# Patient Record
Sex: Male | Born: 1935 | Race: Black or African American | Hispanic: No | Smoking: Never smoker
Health system: Southern US, Community
[De-identification: ages and names within clinical notes are randomized; demographics above are authoritative.]

## PROBLEM LIST (undated history)

## (undated) DIAGNOSIS — I10 Essential (primary) hypertension: Secondary | ICD-10-CM

## (undated) DIAGNOSIS — F0390 Unspecified dementia without behavioral disturbance: Secondary | ICD-10-CM

## (undated) DIAGNOSIS — E039 Hypothyroidism, unspecified: Secondary | ICD-10-CM

## (undated) DIAGNOSIS — I639 Cerebral infarction, unspecified: Secondary | ICD-10-CM

## (undated) DIAGNOSIS — E7800 Pure hypercholesterolemia, unspecified: Secondary | ICD-10-CM

---

## 2001-12-18 ENCOUNTER — Encounter: Payer: Self-pay | Admitting: Ophthalmology

## 2001-12-20 ENCOUNTER — Ambulatory Visit (HOSPITAL_COMMUNITY): Admission: RE | Admit: 2001-12-20 | Discharge: 2001-12-20 | Payer: Self-pay | Admitting: Ophthalmology

## 2001-12-21 ENCOUNTER — Ambulatory Visit (HOSPITAL_COMMUNITY): Admission: RE | Admit: 2001-12-21 | Discharge: 2001-12-21 | Payer: Self-pay | Admitting: Ophthalmology

## 2003-11-28 ENCOUNTER — Ambulatory Visit (HOSPITAL_COMMUNITY): Admission: RE | Admit: 2003-11-28 | Discharge: 2003-11-28 | Payer: Self-pay | Admitting: Ophthalmology

## 2004-01-10 ENCOUNTER — Emergency Department (HOSPITAL_COMMUNITY): Admission: EM | Admit: 2004-01-10 | Discharge: 2004-01-10 | Payer: Self-pay | Admitting: *Deleted

## 2004-01-16 ENCOUNTER — Emergency Department (HOSPITAL_COMMUNITY): Admission: EM | Admit: 2004-01-16 | Discharge: 2004-01-16 | Payer: Self-pay | Admitting: Family Medicine

## 2004-04-15 ENCOUNTER — Emergency Department (HOSPITAL_COMMUNITY): Admission: EM | Admit: 2004-04-15 | Discharge: 2004-04-15 | Payer: Self-pay | Admitting: Emergency Medicine

## 2004-05-04 ENCOUNTER — Encounter: Admission: RE | Admit: 2004-05-04 | Discharge: 2004-05-04 | Payer: Self-pay | Admitting: Sports Medicine

## 2004-07-06 ENCOUNTER — Emergency Department (HOSPITAL_COMMUNITY): Admission: EM | Admit: 2004-07-06 | Discharge: 2004-07-07 | Payer: Self-pay | Admitting: Family Medicine

## 2006-03-17 ENCOUNTER — Encounter: Admission: RE | Admit: 2006-03-17 | Discharge: 2006-03-17 | Payer: Self-pay | Admitting: Cardiology

## 2006-03-22 ENCOUNTER — Encounter (HOSPITAL_COMMUNITY): Admission: RE | Admit: 2006-03-22 | Discharge: 2006-05-04 | Payer: Self-pay | Admitting: Cardiology

## 2006-04-06 ENCOUNTER — Encounter: Admission: RE | Admit: 2006-04-06 | Discharge: 2006-04-06 | Payer: Self-pay | Admitting: Cardiology

## 2007-01-11 ENCOUNTER — Emergency Department (HOSPITAL_COMMUNITY): Admission: EM | Admit: 2007-01-11 | Discharge: 2007-01-11 | Payer: Self-pay | Admitting: Emergency Medicine

## 2007-06-19 ENCOUNTER — Encounter: Admission: RE | Admit: 2007-06-19 | Discharge: 2007-06-19 | Payer: Self-pay | Admitting: Cardiology

## 2009-01-30 ENCOUNTER — Emergency Department (HOSPITAL_COMMUNITY): Admission: EM | Admit: 2009-01-30 | Discharge: 2009-01-30 | Payer: Self-pay | Admitting: Emergency Medicine

## 2009-02-17 ENCOUNTER — Emergency Department (HOSPITAL_COMMUNITY): Admission: EM | Admit: 2009-02-17 | Discharge: 2009-02-17 | Payer: Self-pay | Admitting: Family Medicine

## 2009-07-02 ENCOUNTER — Emergency Department (HOSPITAL_COMMUNITY): Admission: EM | Admit: 2009-07-02 | Discharge: 2009-07-02 | Payer: Self-pay | Admitting: Family Medicine

## 2011-01-07 NOTE — Op Note (Signed)
. [facility]  Patient:    Patrick Rhodes, Patrick Rhodes Visit Number: [id number] MRN: [id number]          Service Type: DSU Location: [facility] 2874 [id number] Attending Physician:  Karenann Cai Dictated by:   Marya Landry Carlyle Lipa., M.D. Proc. Date: 12/21/01 Admit Date:  12/21/2001 Discharge Date: 12/21/2001                             Operative Report  PREOPERATIVE DIAGNOSIS: 1. Dislocated intraocular lens. 2. Status post operative cataract extraction, left eye.  POSTOPERATIVE DIAGNOSIS: 1. Dislocated intraocular lens. 2. Status post operative cataract extraction, left eye.  OPERATION PERFORMED:  Exchange of intraocular lens, left eye with anterior vitrectomy.  SURGEON:  Marya Landry. Carlyle Lipa., M.D.  ANESTHESIA:  General.  INDICATIONS FOR PROCEDURE:  The patient is a 77 year old gentleman who underwent a cataract extraction with intraocular lens implantation yesterday. During the procedure it was noticed that the lens had been partially dislocated.  The lens was removed by the extracapsular technique.  There was a tear or dehiscence of the posterior capsule medially.  An anterior vitrectomy was done and a posterior chamber lens was seated in the sulcus expecting there to be enough posterior capsular support to support the lens.  He came into the office this morning for his first postoperative follow-up.  When the patch was removed, the intraocular lens was found to be displaced inferiorly and medially.  The decision was made to bring him to the operating room so that the lens could be retrieved and exchanged.  He is admitted at this time for that purpose.  DESCRIPTION OF PROCEDURE:  Under the influence of general inhalation anesthesia, the patient was prepped and draped in the usual manner.  The lid speculum was inserted under the upper and lower lids of the left eye.  A 4-0 silk traction suture was passed through ____________ traction.   The eye was inspected.  The lens was found to be located in the same position that had been seen earlier.  A conjunctival flap was raised and hemostasis achieved by using cautery.  Two of the sutures that had been placed ____________ were removed and a lens forceps was inserted into the eye and the superior haptic grasped.  The lens was removed from the eye but vitreous followed the lens from the eye.  An anterior vitrectomy was done and the pupil was constricted using Miochol.  Ascertaining that there was no additional vitreous in the anterior chamber, an anterior chamber lens was seated across the pupil into the chamber angle without difficulty.  The corneoscleral wound was closed by using a combination of interrupted sutures of 8-0 Vicryl and 10-0 nylon. After it was ascertained that the wound was airtight and watertight, the conjunctiva was closed using thermal cautery.  1 cc of Celestone and 0.5 cc of Gentamicin were injected subconjunctivally.  Maxitrol ophthalmic ointment and Pilopine drops were applied along with a patch and  Fox shield.  The patient tolerated the procedure well and was discharged to post anesthesia care unit in satisfactory condition.  His son was instructed how to care for him today. Particularly, he is to restrain from strenuous activity such as stooping, bending and lifting.  He is to see me in the office tomorrow for further evaluation.  DISCHARGE DIAGNOSES: 1. Displaced intraocular lens, left eye. 2. Status post cataract extraction with intraocular lens implantation, left    eye.  Dictated by:   Marya Landry Carlyle Lipa., M.D. Attending Physician:  Karenann Cai DD:  12/21/01 TD:  12/24/01 Job: [id number]

## 2011-01-07 NOTE — Op Note (Signed)
[place]  Patient:    Patrick Rhodes, Patrick Rhodes Visit Number: [id number] MRN: [id number]          Service Type: DSU Location: [facility] 2874 01 Attending Physician:  Karenann Cai Dictated by:   Marya Landry Carlyle Lipa., M.D. Admit Date:  12/21/2001 Discharge Date: 12/21/2001                             Operative Report  PREOPERATIVE DIAGNOSIS:  Immature cataract, left eye.  POSTOPERATIVE DIAGNOSIS:  Immature cataract, left eye.  OPERATION:  Extracapsular cataract extraction with intraocular lens implantation.  SURGEON:  Marya Landry. Carlyle Lipa., M.D.  ANESTHESIA:  Local using Xylocaine 2%, Marcaine 0.75% and Wydase.  JUSTIFICATION FOR PROCEDURE:  This is a 77 year old gentleman who complains of blurring of vision with difficulty seeing to read and get around.  He was evaluated and found to have a visual acuity best corrected to 20/100 on the right, 20/400 on the left.  There are bilateral immature cataracts, worse on the left than the right.  Cataract extraction of the left eye was recommended; he is admitted at this time for this purpose.  He gives a history of trauma to the left eye.  DESCRIPTION OF PROCEDURE:  The patient was taken to the operating room where a Van Lint akinesia and retrobulbar anesthesia were given.  He was prepped and draped in the usual manner.  The lid speculum was inserted under the upper and lower lids of the of the left eye and a 4-0 silk traction suture was passed through the belly of the superior rectus muscle for traction.  A fornix-based conjunctival flap was turned and hemostasis was achieved by using cautery.  An incision was made in the sclera approximately 1 mm posterior to the limbus. This was incision was dissected down in clear cornea using a crescent blade. A sideport incision was made at the 1:30 oclock position and Ocucoat was injected into the eye through sideport incision.  An anterior  capsulotomy was done using a bent 25-gauge needle.  As the capsulotomy was done, it was noticed that there was a clear space between the lens and ciliary body bilaterally, indicating that the lens was dislocated.  Therefore, the decision was made to proceed in extracapsular fashion.  The corneoscleral wound was then extended, first toward the left and then toward the right, placing a single 8-0 Vicryl suture across each arm of the incision as it was made respectively toward the left and toward the right.  The nucleus was then manually expressed from the eye.  The residual cortical material was aspirated using the I/A handpiece.  A small amount of vitreous was ______ in the wound. An anterior vitrectomy was done ______.  It was felt that most of the posterior capsule was intact, therefore an oval posterior chamber lens was then seated behind the eye into the sulcus without difficulty.  The anterior chamber was reformed and the pupil was constricted using Miochol.  The corneoscleral wound was closed by using a combination of interrupted sutures of 8-0 Vicryl and 10-0 nylon.  After it was ascertained that the wound was airtight and watertight, the conjunctiva was closed using thermal cautery. One cc of Celestone and 0.5 cc of gentamicin were injected subconjunctivally. Maxitrol ophthalmic ointment and Pilopine ointment were applied, along with a patch and Fox shield.  The patient tolerated the procedure well and was discharged to the postanesthesia recovery  room in satisfactory condition.  He is instructed to rest today, to take Vicodin every four hours as needed for pain and he is to see me in the office tomorrow for further evaluation.  DISCHARGE DIAGNOSIS:  Immature cataract of left eye. Dictated by:   Marya Landry Carlyle Lipa., M.D. Attending Physician:  Karenann Cai DD:  12/20/01 TD:  12/21/01 Job: [id number]

## 2011-10-06 DIAGNOSIS — E039 Hypothyroidism, unspecified: Secondary | ICD-10-CM | POA: Diagnosis not present

## 2011-10-06 DIAGNOSIS — I2510 Atherosclerotic heart disease of native coronary artery without angina pectoris: Secondary | ICD-10-CM | POA: Diagnosis not present

## 2011-10-06 DIAGNOSIS — D649 Anemia, unspecified: Secondary | ICD-10-CM | POA: Diagnosis not present

## 2011-10-06 DIAGNOSIS — I10 Essential (primary) hypertension: Secondary | ICD-10-CM | POA: Diagnosis not present

## 2012-02-02 DIAGNOSIS — I10 Essential (primary) hypertension: Secondary | ICD-10-CM | POA: Diagnosis not present

## 2012-02-02 DIAGNOSIS — E039 Hypothyroidism, unspecified: Secondary | ICD-10-CM | POA: Diagnosis not present

## 2012-02-02 DIAGNOSIS — I2510 Atherosclerotic heart disease of native coronary artery without angina pectoris: Secondary | ICD-10-CM | POA: Diagnosis not present

## 2012-06-25 DIAGNOSIS — E039 Hypothyroidism, unspecified: Secondary | ICD-10-CM | POA: Diagnosis not present

## 2012-06-25 DIAGNOSIS — R609 Edema, unspecified: Secondary | ICD-10-CM | POA: Diagnosis not present

## 2012-06-25 DIAGNOSIS — I8310 Varicose veins of unspecified lower extremity with inflammation: Secondary | ICD-10-CM | POA: Diagnosis not present

## 2012-06-25 DIAGNOSIS — M2550 Pain in unspecified joint: Secondary | ICD-10-CM | POA: Diagnosis not present

## 2012-06-25 DIAGNOSIS — I10 Essential (primary) hypertension: Secondary | ICD-10-CM | POA: Diagnosis not present

## 2012-07-23 DIAGNOSIS — I8310 Varicose veins of unspecified lower extremity with inflammation: Secondary | ICD-10-CM | POA: Diagnosis not present

## 2012-07-23 DIAGNOSIS — L02419 Cutaneous abscess of limb, unspecified: Secondary | ICD-10-CM | POA: Diagnosis not present

## 2012-07-23 DIAGNOSIS — I10 Essential (primary) hypertension: Secondary | ICD-10-CM | POA: Diagnosis not present

## 2012-07-23 DIAGNOSIS — L03119 Cellulitis of unspecified part of limb: Secondary | ICD-10-CM | POA: Diagnosis not present

## 2012-07-23 DIAGNOSIS — E039 Hypothyroidism, unspecified: Secondary | ICD-10-CM | POA: Diagnosis not present

## 2012-08-07 ENCOUNTER — Other Ambulatory Visit (HOSPITAL_COMMUNITY): Payer: Self-pay | Admitting: Internal Medicine

## 2012-08-07 ENCOUNTER — Ambulatory Visit (HOSPITAL_COMMUNITY)
Admission: RE | Admit: 2012-08-07 | Discharge: 2012-08-07 | Disposition: A | Discharge status: Home / Self Care | Payer: Medicare Other | Source: Ambulatory Visit | Attending: Internal Medicine | Admitting: Internal Medicine

## 2012-08-07 DIAGNOSIS — I10 Essential (primary) hypertension: Secondary | ICD-10-CM | POA: Diagnosis not present

## 2012-08-07 DIAGNOSIS — I2510 Atherosclerotic heart disease of native coronary artery without angina pectoris: Secondary | ICD-10-CM | POA: Diagnosis not present

## 2012-08-07 DIAGNOSIS — M7989 Other specified soft tissue disorders: Secondary | ICD-10-CM

## 2012-08-07 DIAGNOSIS — M79606 Pain in leg, unspecified: Secondary | ICD-10-CM

## 2012-08-07 DIAGNOSIS — M79609 Pain in unspecified limb: Secondary | ICD-10-CM | POA: Diagnosis not present

## 2012-08-07 DIAGNOSIS — E039 Hypothyroidism, unspecified: Secondary | ICD-10-CM | POA: Diagnosis not present

## 2012-08-07 NOTE — Progress Notes (Signed)
Left:  No evidence of DVT, superficial thrombosis, or Baker's cyst.  Right:  Negative for DVT in the common femoral vein.  

## 2013-05-30 DIAGNOSIS — H43819 Vitreous degeneration, unspecified eye: Secondary | ICD-10-CM | POA: Diagnosis not present

## 2013-05-30 DIAGNOSIS — H5710 Ocular pain, unspecified eye: Secondary | ICD-10-CM | POA: Diagnosis not present

## 2013-10-31 DIAGNOSIS — E039 Hypothyroidism, unspecified: Secondary | ICD-10-CM | POA: Diagnosis not present

## 2013-10-31 DIAGNOSIS — I10 Essential (primary) hypertension: Secondary | ICD-10-CM | POA: Diagnosis not present

## 2013-10-31 DIAGNOSIS — Z125 Encounter for screening for malignant neoplasm of prostate: Secondary | ICD-10-CM | POA: Diagnosis not present

## 2013-10-31 DIAGNOSIS — I2510 Atherosclerotic heart disease of native coronary artery without angina pectoris: Secondary | ICD-10-CM | POA: Diagnosis not present

## 2014-01-09 DIAGNOSIS — H538 Other visual disturbances: Secondary | ICD-10-CM | POA: Diagnosis not present

## 2014-01-09 DIAGNOSIS — H43819 Vitreous degeneration, unspecified eye: Secondary | ICD-10-CM | POA: Diagnosis not present

## 2014-06-05 DIAGNOSIS — I10 Essential (primary) hypertension: Secondary | ICD-10-CM | POA: Diagnosis not present

## 2014-06-05 DIAGNOSIS — I2510 Atherosclerotic heart disease of native coronary artery without angina pectoris: Secondary | ICD-10-CM | POA: Diagnosis not present

## 2014-06-05 DIAGNOSIS — Z125 Encounter for screening for malignant neoplasm of prostate: Secondary | ICD-10-CM | POA: Diagnosis not present

## 2014-06-05 DIAGNOSIS — E039 Hypothyroidism, unspecified: Secondary | ICD-10-CM | POA: Diagnosis not present

## 2014-06-26 DIAGNOSIS — I6990 Unspecified sequelae of unspecified cerebrovascular disease: Secondary | ICD-10-CM | POA: Diagnosis not present

## 2014-06-26 DIAGNOSIS — R269 Unspecified abnormalities of gait and mobility: Secondary | ICD-10-CM | POA: Diagnosis not present

## 2014-07-01 DIAGNOSIS — R269 Unspecified abnormalities of gait and mobility: Secondary | ICD-10-CM | POA: Diagnosis not present

## 2014-07-01 DIAGNOSIS — I6990 Unspecified sequelae of unspecified cerebrovascular disease: Secondary | ICD-10-CM | POA: Diagnosis not present

## 2014-07-04 DIAGNOSIS — I6990 Unspecified sequelae of unspecified cerebrovascular disease: Secondary | ICD-10-CM | POA: Diagnosis not present

## 2014-07-04 DIAGNOSIS — R269 Unspecified abnormalities of gait and mobility: Secondary | ICD-10-CM | POA: Diagnosis not present

## 2014-07-09 DIAGNOSIS — I6990 Unspecified sequelae of unspecified cerebrovascular disease: Secondary | ICD-10-CM | POA: Diagnosis not present

## 2014-07-09 DIAGNOSIS — R269 Unspecified abnormalities of gait and mobility: Secondary | ICD-10-CM | POA: Diagnosis not present

## 2014-07-14 DIAGNOSIS — R269 Unspecified abnormalities of gait and mobility: Secondary | ICD-10-CM | POA: Diagnosis not present

## 2014-07-14 DIAGNOSIS — I6990 Unspecified sequelae of unspecified cerebrovascular disease: Secondary | ICD-10-CM | POA: Diagnosis not present

## 2014-07-16 DIAGNOSIS — I6990 Unspecified sequelae of unspecified cerebrovascular disease: Secondary | ICD-10-CM | POA: Diagnosis not present

## 2014-07-16 DIAGNOSIS — R269 Unspecified abnormalities of gait and mobility: Secondary | ICD-10-CM | POA: Diagnosis not present

## 2014-07-30 DIAGNOSIS — I6990 Unspecified sequelae of unspecified cerebrovascular disease: Secondary | ICD-10-CM | POA: Diagnosis not present

## 2014-07-30 DIAGNOSIS — R269 Unspecified abnormalities of gait and mobility: Secondary | ICD-10-CM | POA: Diagnosis not present

## 2014-08-08 DIAGNOSIS — R269 Unspecified abnormalities of gait and mobility: Secondary | ICD-10-CM | POA: Diagnosis not present

## 2014-08-08 DIAGNOSIS — I6990 Unspecified sequelae of unspecified cerebrovascular disease: Secondary | ICD-10-CM | POA: Diagnosis not present

## 2014-09-10 DIAGNOSIS — E039 Hypothyroidism, unspecified: Secondary | ICD-10-CM | POA: Diagnosis not present

## 2014-09-10 DIAGNOSIS — I10 Essential (primary) hypertension: Secondary | ICD-10-CM | POA: Diagnosis not present

## 2014-09-10 DIAGNOSIS — I2510 Atherosclerotic heart disease of native coronary artery without angina pectoris: Secondary | ICD-10-CM | POA: Diagnosis not present

## 2014-11-10 DIAGNOSIS — I2510 Atherosclerotic heart disease of native coronary artery without angina pectoris: Secondary | ICD-10-CM | POA: Diagnosis not present

## 2014-11-10 DIAGNOSIS — E039 Hypothyroidism, unspecified: Secondary | ICD-10-CM | POA: Diagnosis not present

## 2014-11-10 DIAGNOSIS — I10 Essential (primary) hypertension: Secondary | ICD-10-CM | POA: Diagnosis not present

## 2014-11-11 DIAGNOSIS — H01009 Unspecified blepharitis unspecified eye, unspecified eyelid: Secondary | ICD-10-CM | POA: Diagnosis not present

## 2014-11-11 DIAGNOSIS — H43813 Vitreous degeneration, bilateral: Secondary | ICD-10-CM | POA: Diagnosis not present

## 2015-03-09 DIAGNOSIS — E780 Pure hypercholesterolemia: Secondary | ICD-10-CM | POA: Diagnosis not present

## 2015-03-09 DIAGNOSIS — E039 Hypothyroidism, unspecified: Secondary | ICD-10-CM | POA: Diagnosis not present

## 2015-03-09 DIAGNOSIS — I10 Essential (primary) hypertension: Secondary | ICD-10-CM | POA: Diagnosis not present

## 2015-03-09 DIAGNOSIS — I2510 Atherosclerotic heart disease of native coronary artery without angina pectoris: Secondary | ICD-10-CM | POA: Diagnosis not present

## 2015-05-27 DIAGNOSIS — I10 Essential (primary) hypertension: Secondary | ICD-10-CM | POA: Diagnosis not present

## 2015-05-27 DIAGNOSIS — E781 Pure hyperglyceridemia: Secondary | ICD-10-CM | POA: Diagnosis not present

## 2015-05-27 DIAGNOSIS — E039 Hypothyroidism, unspecified: Secondary | ICD-10-CM | POA: Diagnosis not present

## 2015-07-15 DIAGNOSIS — E781 Pure hyperglyceridemia: Secondary | ICD-10-CM | POA: Diagnosis not present

## 2015-07-15 DIAGNOSIS — E039 Hypothyroidism, unspecified: Secondary | ICD-10-CM | POA: Diagnosis not present

## 2015-07-15 DIAGNOSIS — I2510 Atherosclerotic heart disease of native coronary artery without angina pectoris: Secondary | ICD-10-CM | POA: Diagnosis not present

## 2015-07-15 DIAGNOSIS — Z125 Encounter for screening for malignant neoplasm of prostate: Secondary | ICD-10-CM | POA: Diagnosis not present

## 2015-07-15 DIAGNOSIS — I10 Essential (primary) hypertension: Secondary | ICD-10-CM | POA: Diagnosis not present

## 2015-09-13 ENCOUNTER — Emergency Department (HOSPITAL_COMMUNITY): Payer: Medicare Other

## 2015-09-13 ENCOUNTER — Emergency Department (HOSPITAL_COMMUNITY)
Admission: EM | Admit: 2015-09-13 | Discharge: 2015-09-13 | Disposition: A | Discharge status: Home / Self Care | Payer: Self-pay

## 2015-09-13 ENCOUNTER — Emergency Department (HOSPITAL_COMMUNITY)
Admission: EM | Admit: 2015-09-13 | Discharge: 2015-09-13 | Disposition: A | Discharge status: Home / Self Care | Payer: Medicare Other | Attending: Emergency Medicine | Admitting: Emergency Medicine

## 2015-09-13 ENCOUNTER — Encounter (HOSPITAL_COMMUNITY): Payer: Self-pay | Admitting: Emergency Medicine

## 2015-09-13 DIAGNOSIS — M62421 Contracture of muscle, right upper arm: Secondary | ICD-10-CM | POA: Insufficient documentation

## 2015-09-13 DIAGNOSIS — R4182 Altered mental status, unspecified: Secondary | ICD-10-CM | POA: Insufficient documentation

## 2015-09-13 DIAGNOSIS — Z8673 Personal history of transient ischemic attack (TIA), and cerebral infarction without residual deficits: Secondary | ICD-10-CM | POA: Insufficient documentation

## 2015-09-13 DIAGNOSIS — I10 Essential (primary) hypertension: Secondary | ICD-10-CM | POA: Diagnosis not present

## 2015-09-13 DIAGNOSIS — E7800 Pure hypercholesterolemia, unspecified: Secondary | ICD-10-CM | POA: Diagnosis not present

## 2015-09-13 DIAGNOSIS — Z7982 Long term (current) use of aspirin: Secondary | ICD-10-CM | POA: Insufficient documentation

## 2015-09-13 DIAGNOSIS — Z79899 Other long term (current) drug therapy: Secondary | ICD-10-CM | POA: Insufficient documentation

## 2015-09-13 DIAGNOSIS — M25511 Pain in right shoulder: Secondary | ICD-10-CM | POA: Diagnosis present

## 2015-09-13 DIAGNOSIS — E039 Hypothyroidism, unspecified: Secondary | ICD-10-CM | POA: Insufficient documentation

## 2015-09-13 HISTORY — DX: Pure hypercholesterolemia, unspecified: E78.00

## 2015-09-13 HISTORY — DX: Hypothyroidism, unspecified: E03.9

## 2015-09-13 HISTORY — DX: Cerebral infarction, unspecified: I63.9

## 2015-09-13 HISTORY — DX: Essential (primary) hypertension: I10

## 2015-09-13 LAB — DIFFERENTIAL
BASOS PCT: 0 %
Basophils Absolute: 0 10*3/uL (ref 0.0–0.1)
EOS ABS: 0.1 10*3/uL (ref 0.0–0.7)
Eosinophils Relative: 1 %
Lymphocytes Relative: 31 %
Lymphs Abs: 1.9 10*3/uL (ref 0.7–4.0)
MONO ABS: 0.9 10*3/uL (ref 0.1–1.0)
MONOS PCT: 15 %
Neutro Abs: 3.1 10*3/uL (ref 1.7–7.7)
Neutrophils Relative %: 53 %

## 2015-09-13 LAB — I-STAT CHEM 8, ED
BUN: 22 mg/dL — AB (ref 6–20)
Calcium, Ion: 1.38 mmol/L — ABNORMAL HIGH (ref 1.13–1.30)
Chloride: 103 mmol/L (ref 101–111)
Creatinine, Ser: 0.9 mg/dL (ref 0.61–1.24)
Glucose, Bld: 98 mg/dL (ref 65–99)
HEMATOCRIT: 34 % — AB (ref 39.0–52.0)
Hemoglobin: 11.6 g/dL — ABNORMAL LOW (ref 13.0–17.0)
Potassium: 3.7 mmol/L (ref 3.5–5.1)
SODIUM: 140 mmol/L (ref 135–145)
TCO2: 25 mmol/L (ref 0–100)

## 2015-09-13 LAB — COMPREHENSIVE METABOLIC PANEL
ALT: 14 U/L — AB (ref 17–63)
AST: 20 U/L (ref 15–41)
Albumin: 3.9 g/dL (ref 3.5–5.0)
Alkaline Phosphatase: 73 U/L (ref 38–126)
Anion gap: 10 (ref 5–15)
BILIRUBIN TOTAL: 2.4 mg/dL — AB (ref 0.3–1.2)
BUN: 19 mg/dL (ref 6–20)
CHLORIDE: 105 mmol/L (ref 101–111)
CO2: 25 mmol/L (ref 22–32)
CREATININE: 0.96 mg/dL (ref 0.61–1.24)
Calcium: 11.2 mg/dL — ABNORMAL HIGH (ref 8.9–10.3)
Glucose, Bld: 102 mg/dL — ABNORMAL HIGH (ref 65–99)
POTASSIUM: 3.8 mmol/L (ref 3.5–5.1)
Sodium: 140 mmol/L (ref 135–145)
TOTAL PROTEIN: 6.9 g/dL (ref 6.5–8.1)

## 2015-09-13 LAB — CBC
HCT: 32.6 % — ABNORMAL LOW (ref 39.0–52.0)
Hemoglobin: 11.1 g/dL — ABNORMAL LOW (ref 13.0–17.0)
MCH: 29.1 pg (ref 26.0–34.0)
MCHC: 34 g/dL (ref 30.0–36.0)
MCV: 85.3 fL (ref 78.0–100.0)
Platelets: 231 10*3/uL (ref 150–400)
RBC: 3.82 MIL/uL — AB (ref 4.22–5.81)
RDW: 12.9 % (ref 11.5–15.5)
WBC: 5.9 10*3/uL (ref 4.0–10.5)

## 2015-09-13 LAB — PROTIME-INR
INR: 1.13 (ref 0.00–1.49)
PROTHROMBIN TIME: 14.7 s (ref 11.6–15.2)

## 2015-09-13 LAB — APTT: APTT: 29 s (ref 24–37)

## 2015-09-13 MED ORDER — HYDROCODONE-ACETAMINOPHEN 5-325 MG PO TABS
1.0000 | ORAL_TABLET | Freq: Once | ORAL | Status: AC
  Administered 2015-09-13: 1 via ORAL
  Filled 2015-09-13: qty 1

## 2015-09-13 MED ORDER — IBUPROFEN 400 MG PO TABS
400.0000 mg | ORAL_TABLET | Freq: Once | ORAL | Status: AC
  Administered 2015-09-13: 400 mg via ORAL
  Filled 2015-09-13: qty 1

## 2015-09-13 NOTE — Discharge Instructions (Signed)

## 2015-09-13 NOTE — ED Notes (Signed)
Family reports aide noticed yesterday that pt was unable to lift his R arm.  Today pt has had unsteady gait and "a little" confusion.  Pt alert and oriented x 4 at this time.  Pt is moving R arm some but states he is unable to lift it due to previous stroke. Per son, pt is normally able to lift arm.  Pt reports putting his clothes on backwards today.  C/o pain to R shoulder and R axilla since this morning.

## 2015-09-14 NOTE — ED Provider Notes (Signed)
CSN: [id number]     Arrival date & time 09/13/15  1830 History   First MD Initiated Contact with Patient 09/13/15 2137     Chief Complaint  Patient presents with  . Extremity Weakness     (Consider location/radiation/quality/duration/timing/severity/associated sxs/prior Treatment) HPI    26 y w PMH prev cva, who comes in with R shoulder pain and pain with moving his R shoulder since today.  The patients son also noticed that he seemed a little confused today and put his shirt on backwards.  Patient admits to pain in his R shoulder that is mild-mod, worse with movement, better with rest.  No chest pain/sob.  No falls, no headache.  Past Medical History  Diagnosis Date  . Stroke ([place])   . Hypothyroidism   . Hypertension   . High cholesterol    History reviewed. No pertinent past surgical history. No family history on file. Social History  Substance Use Topics  . Smoking status: Never Smoker   . Smokeless tobacco: None  . Alcohol Use: No    Review of Systems  Constitutional: Negative for fever and chills.  Eyes: Negative for redness.  Respiratory: Negative for cough and shortness of breath.   Cardiovascular: Negative for chest pain.  Gastrointestinal: Negative for nausea, vomiting, abdominal pain and diarrhea.  Genitourinary: Negative for dysuria.  Musculoskeletal:       R shoulder pain  Skin: Negative for rash.  Neurological: Negative for headaches.  All other systems reviewed and are negative.     Allergies  Review of patient's allergies indicates no known allergies.  Home Medications   Prior to Admission medications   Medication Sig Start Date End Date Taking? Authorizing Provider  aspirin EC 81 MG tablet Take 81 mg by mouth daily.   Yes Historical Provider, MD  atorvastatin (LIPITOR) 10 MG tablet Take 10 mg by mouth every evening.   Yes Historical Provider, MD  levothyroxine (SYNTHROID, LEVOTHROID) 75 MCG tablet Take 75 mcg by mouth daily before breakfast.    Yes Historical Provider, MD  losartan (COZAAR) 100 MG tablet Take 100 mg by mouth daily.   Yes Historical Provider, MD  Multiple Vitamin (MULTIVITAMIN WITH MINERALS) TABS tablet Take 1 tablet by mouth daily.   Yes Historical Provider, MD   BP 171/88 mmHg  Pulse 61  Temp(Src) 98.6 F (37 C) (Oral)  Resp 12  Wt 62.143 kg  SpO2 100% Physical Exam  Constitutional: He is oriented to person, place, and time. No distress.  HENT:  Head: Normocephalic and atraumatic.  Eyes: EOM are normal. Pupils are equal, round, and reactive to light.  Neck: Normal range of motion. Neck supple.  Cardiovascular: Normal rate.   Pulmonary/Chest: Effort normal. No respiratory distress.  Abdominal: Soft. There is no tenderness.  Musculoskeletal:  R shoulder appears atraumatic.  I am able to range the R shoulder but it causes some pain.  Patient has some chronic contractures in the RUE.  Patient seems to have 4/5 strength in biceps/triceps/grip of the RUE.  He has a good R radial pulse.  There are no skin findings on the R arm  Neurological: He is alert and oriented to person, place, and time. He is not disoriented. No cranial nerve deficit or sensory deficit. He exhibits abnormal muscle tone (contractures RUE).  Full strength in bilateral lower extremities  Skin: No rash noted. He is not diaphoretic.  Psychiatric: He has a normal mood and affect.    ED Course  Procedures (including critical care  time) Labs Review Labs Reviewed  CBC - Abnormal; Notable for the following:    RBC 3.82 (*)    Hemoglobin 11.1 (*)    HCT 32.6 (*)    All other components within normal limits  COMPREHENSIVE METABOLIC PANEL - Abnormal; Notable for the following:    Glucose, Bld 102 (*)    Calcium 11.2 (*)    ALT 14 (*)    Total Bilirubin 2.4 (*)    All other components within normal limits  I-STAT CHEM 8, ED - Abnormal; Notable for the following:    BUN 22 (*)    Calcium, Ion 1.38 (*)    Hemoglobin 11.6 (*)    HCT 34.0 (*)     All other components within normal limits  PROTIME-INR  APTT  DIFFERENTIAL  I-STAT TROPOININ, ED  CBG MONITORING, ED    Imaging Review Dg Shoulder Right  09/13/2015  CLINICAL DATA:  Right shoulder pain with difficulty moving. No reported injury. EXAM: RIGHT SHOULDER - 2+ VIEW COMPARISON:  None. FINDINGS: Degenerative changes in the glenohumeral joint and acromioclavicular joint with joint space narrowing and osteophyte formation. No evidence of acute fracture or dislocation. Soft tissues are unremarkable. IMPRESSION: Degenerative changes in the right shoulder involving acromioclavicular and glenohumeral joints. No acute fractures. Electronically Signed   By: Lucienne Capers M.D.   On: 09/13/2015 23:07   Ct Head Wo Contrast  09/13/2015  CLINICAL DATA:  Patient unable to lift right arm yesterday, unsteady gait today, some altered mental status EXAM: CT HEAD WITHOUT CONTRAST TECHNIQUE: Contiguous axial images were obtained from the base of the skull through the vertex without intravenous contrast. COMPARISON:  06/19/2007 FINDINGS: Moderate diffuse cortical atrophy. Cavum septum pellucidum normal anatomic variant. Low attenuation in the deep white matter. Tiny chronic lacunar infarcts basal ganglia regions bilaterally, stable. No evidence of acute vascular territory infarct. No evidence of mass. No hemorrhage or extra-axial fluid. Calvarium intact. IMPRESSION: Chronic involutional change with no acute findings Electronically Signed   By: Skipper Cliche M.D.   On: 09/13/2015 20:48   I have personally reviewed and evaluated these images and lab results as part of my medical decision-making.   EKG Interpretation   Date/Time:  Sunday September 13 2015 19:09:48 EST Ventricular Rate:  79 PR Interval:  196 QRS Duration: 86 QT Interval:  372 QTC Calculation: 426 R Axis:   69 Text Interpretation:  Normal sinus rhythm Nonspecific ST abnormality  Confirmed by Ashok Cordia  MD, Lennette Bihari ([id number]) on 09/13/2015  9:45:32 PM      MDM   Final diagnoses:  Right shoulder pain   65 y w PMH prev cva, who comes in with R shoulder pain and pain with moving his R shoulder since today associated with possible mild confusion.  On exam he seems to have baseline strength in his RUE and has pain in the R shoulder which is what he says the problem is today.  Will obtain plain film of the R shoulder. Cbc/bmp/ct head obtained and all unremarkable.  ekg obtained given shoulder pain and unremarkable.  Doubt acs given shoulder pain is reproducible on exam. 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  Doubt septic joint given I am able to range it, no fever, no leukocytosis but the patient and his son were informed of this possibility and the need to continue to monitor.  I have discussed the results, Dx and Tx plan with the pt. They expressed  understanding and agree with the plan and were told to return to ED with any worsening of condition or concern.    Disposition: Discharge  Condition: Good  Discharge Medication List as of 09/13/2015 11:17 PM      Follow Up: Foye Spurling, MD [address] [place] [address] [phone]  In 3 days    Pt seen in conjunction with Dr. Carylon Perches, MD 09/14/15 [place], MD 09/15/15 [phone]

## 2016-05-18 DIAGNOSIS — E039 Hypothyroidism, unspecified: Secondary | ICD-10-CM | POA: Diagnosis not present

## 2016-05-18 DIAGNOSIS — I10 Essential (primary) hypertension: Secondary | ICD-10-CM | POA: Diagnosis not present

## 2016-05-18 DIAGNOSIS — Z125 Encounter for screening for malignant neoplasm of prostate: Secondary | ICD-10-CM | POA: Diagnosis not present

## 2016-05-18 DIAGNOSIS — I2510 Atherosclerotic heart disease of native coronary artery without angina pectoris: Secondary | ICD-10-CM | POA: Diagnosis not present

## 2016-05-18 DIAGNOSIS — L03317 Cellulitis of buttock: Secondary | ICD-10-CM | POA: Diagnosis not present

## 2016-07-11 DIAGNOSIS — D649 Anemia, unspecified: Secondary | ICD-10-CM | POA: Diagnosis not present

## 2016-07-11 DIAGNOSIS — I10 Essential (primary) hypertension: Secondary | ICD-10-CM | POA: Diagnosis not present

## 2016-07-11 DIAGNOSIS — E039 Hypothyroidism, unspecified: Secondary | ICD-10-CM | POA: Diagnosis not present

## 2016-07-11 DIAGNOSIS — I2510 Atherosclerotic heart disease of native coronary artery without angina pectoris: Secondary | ICD-10-CM | POA: Diagnosis not present

## 2016-07-11 DIAGNOSIS — E7800 Pure hypercholesterolemia, unspecified: Secondary | ICD-10-CM | POA: Diagnosis not present

## 2016-12-21 DIAGNOSIS — N419 Inflammatory disease of prostate, unspecified: Secondary | ICD-10-CM | POA: Diagnosis not present

## 2016-12-21 DIAGNOSIS — I10 Essential (primary) hypertension: Secondary | ICD-10-CM | POA: Diagnosis not present

## 2016-12-21 DIAGNOSIS — N390 Urinary tract infection, site not specified: Secondary | ICD-10-CM | POA: Diagnosis not present

## 2016-12-21 DIAGNOSIS — E039 Hypothyroidism, unspecified: Secondary | ICD-10-CM | POA: Diagnosis not present

## 2016-12-21 DIAGNOSIS — M2550 Pain in unspecified joint: Secondary | ICD-10-CM | POA: Diagnosis not present

## 2016-12-21 DIAGNOSIS — I2510 Atherosclerotic heart disease of native coronary artery without angina pectoris: Secondary | ICD-10-CM | POA: Diagnosis not present

## 2017-01-18 DIAGNOSIS — I10 Essential (primary) hypertension: Secondary | ICD-10-CM | POA: Diagnosis not present

## 2017-01-18 DIAGNOSIS — E7800 Pure hypercholesterolemia, unspecified: Secondary | ICD-10-CM | POA: Diagnosis not present

## 2017-01-18 DIAGNOSIS — E039 Hypothyroidism, unspecified: Secondary | ICD-10-CM | POA: Diagnosis not present

## 2017-01-18 DIAGNOSIS — N390 Urinary tract infection, site not specified: Secondary | ICD-10-CM | POA: Diagnosis not present

## 2017-01-18 DIAGNOSIS — I2510 Atherosclerotic heart disease of native coronary artery without angina pectoris: Secondary | ICD-10-CM | POA: Diagnosis not present

## 2017-06-06 DIAGNOSIS — H43813 Vitreous degeneration, bilateral: Secondary | ICD-10-CM | POA: Diagnosis not present

## 2017-06-06 DIAGNOSIS — H401134 Primary open-angle glaucoma, bilateral, indeterminate stage: Secondary | ICD-10-CM | POA: Diagnosis not present

## 2017-06-08 DIAGNOSIS — E039 Hypothyroidism, unspecified: Secondary | ICD-10-CM | POA: Diagnosis not present

## 2017-06-08 DIAGNOSIS — I10 Essential (primary) hypertension: Secondary | ICD-10-CM | POA: Diagnosis not present

## 2017-06-08 DIAGNOSIS — Z125 Encounter for screening for malignant neoplasm of prostate: Secondary | ICD-10-CM | POA: Diagnosis not present

## 2017-06-08 DIAGNOSIS — M2550 Pain in unspecified joint: Secondary | ICD-10-CM | POA: Diagnosis not present

## 2017-06-08 DIAGNOSIS — E119 Type 2 diabetes mellitus without complications: Secondary | ICD-10-CM | POA: Diagnosis not present

## 2017-06-08 DIAGNOSIS — I2510 Atherosclerotic heart disease of native coronary artery without angina pectoris: Secondary | ICD-10-CM | POA: Diagnosis not present

## 2017-06-08 DIAGNOSIS — E7800 Pure hypercholesterolemia, unspecified: Secondary | ICD-10-CM | POA: Diagnosis not present

## 2018-09-27 ENCOUNTER — Other Ambulatory Visit: Payer: Self-pay

## 2018-10-13 ENCOUNTER — Other Ambulatory Visit: Payer: Self-pay

## 2018-10-13 ENCOUNTER — Emergency Department (HOSPITAL_COMMUNITY): Payer: Medicare (Managed Care)

## 2018-10-13 ENCOUNTER — Encounter (HOSPITAL_COMMUNITY): Payer: Self-pay | Admitting: Emergency Medicine

## 2018-10-13 ENCOUNTER — Inpatient Hospital Stay (HOSPITAL_COMMUNITY)
Admission: EM | Admit: 2018-10-13 | Discharge: 2018-10-19 | DRG: 064 | Disposition: A | Discharge status: Home Health | Payer: Medicare (Managed Care) | Attending: Internal Medicine | Admitting: Internal Medicine

## 2018-10-13 DIAGNOSIS — I679 Cerebrovascular disease, unspecified: Secondary | ICD-10-CM | POA: Diagnosis not present

## 2018-10-13 DIAGNOSIS — E785 Hyperlipidemia, unspecified: Secondary | ICD-10-CM | POA: Diagnosis present

## 2018-10-13 DIAGNOSIS — K5900 Constipation, unspecified: Secondary | ICD-10-CM | POA: Diagnosis present

## 2018-10-13 DIAGNOSIS — R9389 Abnormal findings on diagnostic imaging of other specified body structures: Secondary | ICD-10-CM

## 2018-10-13 DIAGNOSIS — E8809 Other disorders of plasma-protein metabolism, not elsewhere classified: Secondary | ICD-10-CM | POA: Diagnosis present

## 2018-10-13 DIAGNOSIS — J189 Pneumonia, unspecified organism: Secondary | ICD-10-CM | POA: Diagnosis present

## 2018-10-13 DIAGNOSIS — R011 Cardiac murmur, unspecified: Secondary | ICD-10-CM | POA: Diagnosis not present

## 2018-10-13 DIAGNOSIS — I6602 Occlusion and stenosis of left middle cerebral artery: Secondary | ICD-10-CM | POA: Diagnosis present

## 2018-10-13 DIAGNOSIS — R471 Dysarthria and anarthria: Secondary | ICD-10-CM | POA: Diagnosis present

## 2018-10-13 DIAGNOSIS — I712 Thoracic aortic aneurysm, without rupture: Secondary | ICD-10-CM | POA: Diagnosis present

## 2018-10-13 DIAGNOSIS — G9349 Other encephalopathy: Secondary | ICD-10-CM | POA: Diagnosis present

## 2018-10-13 DIAGNOSIS — R451 Restlessness and agitation: Secondary | ICD-10-CM | POA: Diagnosis not present

## 2018-10-13 DIAGNOSIS — D320 Benign neoplasm of cerebral meninges: Secondary | ICD-10-CM | POA: Diagnosis present

## 2018-10-13 DIAGNOSIS — R64 Cachexia: Secondary | ICD-10-CM | POA: Diagnosis present

## 2018-10-13 DIAGNOSIS — R482 Apraxia: Secondary | ICD-10-CM | POA: Diagnosis present

## 2018-10-13 DIAGNOSIS — I313 Pericardial effusion (noninflammatory): Secondary | ICD-10-CM | POA: Diagnosis present

## 2018-10-13 DIAGNOSIS — F0150 Vascular dementia without behavioral disturbance: Secondary | ICD-10-CM | POA: Diagnosis present

## 2018-10-13 DIAGNOSIS — I10 Essential (primary) hypertension: Secondary | ICD-10-CM | POA: Diagnosis present

## 2018-10-13 DIAGNOSIS — Z87891 Personal history of nicotine dependence: Secondary | ICD-10-CM | POA: Diagnosis not present

## 2018-10-13 DIAGNOSIS — Z79890 Hormone replacement therapy (postmenopausal): Secondary | ICD-10-CM | POA: Diagnosis not present

## 2018-10-13 DIAGNOSIS — E039 Hypothyroidism, unspecified: Secondary | ICD-10-CM | POA: Diagnosis present

## 2018-10-13 DIAGNOSIS — R262 Difficulty in walking, not elsewhere classified: Secondary | ICD-10-CM | POA: Diagnosis present

## 2018-10-13 DIAGNOSIS — Z8249 Family history of ischemic heart disease and other diseases of the circulatory system: Secondary | ICD-10-CM

## 2018-10-13 DIAGNOSIS — J181 Lobar pneumonia, unspecified organism: Secondary | ICD-10-CM | POA: Diagnosis not present

## 2018-10-13 DIAGNOSIS — B999 Unspecified infectious disease: Secondary | ICD-10-CM | POA: Diagnosis present

## 2018-10-13 DIAGNOSIS — R32 Unspecified urinary incontinence: Secondary | ICD-10-CM | POA: Diagnosis present

## 2018-10-13 DIAGNOSIS — R10819 Abdominal tenderness, unspecified site: Secondary | ICD-10-CM

## 2018-10-13 DIAGNOSIS — K469 Unspecified abdominal hernia without obstruction or gangrene: Secondary | ICD-10-CM | POA: Diagnosis not present

## 2018-10-13 DIAGNOSIS — R4189 Other symptoms and signs involving cognitive functions and awareness: Secondary | ICD-10-CM | POA: Diagnosis not present

## 2018-10-13 DIAGNOSIS — E7800 Pure hypercholesterolemia, unspecified: Secondary | ICD-10-CM | POA: Diagnosis present

## 2018-10-13 DIAGNOSIS — F0390 Unspecified dementia without behavioral disturbance: Secondary | ICD-10-CM | POA: Diagnosis not present

## 2018-10-13 DIAGNOSIS — G9340 Encephalopathy, unspecified: Secondary | ICD-10-CM | POA: Diagnosis not present

## 2018-10-13 DIAGNOSIS — I6502 Occlusion and stenosis of left vertebral artery: Secondary | ICD-10-CM | POA: Diagnosis present

## 2018-10-13 DIAGNOSIS — I639 Cerebral infarction, unspecified: Secondary | ICD-10-CM | POA: Diagnosis present

## 2018-10-13 DIAGNOSIS — Z79899 Other long term (current) drug therapy: Secondary | ICD-10-CM | POA: Diagnosis not present

## 2018-10-13 DIAGNOSIS — I63542 Cerebral infarction due to unspecified occlusion or stenosis of left cerebellar artery: Secondary | ICD-10-CM | POA: Diagnosis not present

## 2018-10-13 DIAGNOSIS — R531 Weakness: Secondary | ICD-10-CM | POA: Diagnosis not present

## 2018-10-13 DIAGNOSIS — R4182 Altered mental status, unspecified: Secondary | ICD-10-CM | POA: Diagnosis not present

## 2018-10-13 DIAGNOSIS — I361 Nonrheumatic tricuspid (valve) insufficiency: Secondary | ICD-10-CM | POA: Diagnosis not present

## 2018-10-13 DIAGNOSIS — Z7902 Long term (current) use of antithrombotics/antiplatelets: Secondary | ICD-10-CM | POA: Diagnosis not present

## 2018-10-13 DIAGNOSIS — A419 Sepsis, unspecified organism: Secondary | ICD-10-CM | POA: Diagnosis not present

## 2018-10-13 DIAGNOSIS — Z7982 Long term (current) use of aspirin: Secondary | ICD-10-CM | POA: Diagnosis not present

## 2018-10-13 DIAGNOSIS — Z8673 Personal history of transient ischemic attack (TIA), and cerebral infarction without residual deficits: Secondary | ICD-10-CM | POA: Diagnosis not present

## 2018-10-13 DIAGNOSIS — I340 Nonrheumatic mitral (valve) insufficiency: Secondary | ICD-10-CM | POA: Diagnosis not present

## 2018-10-13 LAB — CBC WITH DIFFERENTIAL/PLATELET
Abs Immature Granulocytes: 0.07 10*3/uL (ref 0.00–0.07)
Basophils Absolute: 0 10*3/uL (ref 0.0–0.1)
Basophils Relative: 0 %
Eosinophils Absolute: 0 10*3/uL (ref 0.0–0.5)
Eosinophils Relative: 0 %
HCT: 34.9 % — ABNORMAL LOW (ref 39.0–52.0)
Hemoglobin: 10.9 g/dL — ABNORMAL LOW (ref 13.0–17.0)
Immature Granulocytes: 1 %
Lymphocytes Relative: 8 %
Lymphs Abs: 1.1 10*3/uL (ref 0.7–4.0)
MCH: 28.1 pg (ref 26.0–34.0)
MCHC: 31.2 g/dL (ref 30.0–36.0)
MCV: 89.9 fL (ref 80.0–100.0)
Monocytes Absolute: 1 10*3/uL (ref 0.1–1.0)
Monocytes Relative: 8 %
Neutro Abs: 11.4 10*3/uL — ABNORMAL HIGH (ref 1.7–7.7)
Neutrophils Relative %: 83 %
Platelets: 314 10*3/uL (ref 150–400)
RBC: 3.88 MIL/uL — ABNORMAL LOW (ref 4.22–5.81)
RDW: 11.6 % (ref 11.5–15.5)
WBC: 13.6 10*3/uL — ABNORMAL HIGH (ref 4.0–10.5)
nRBC: 0 % (ref 0.0–0.2)

## 2018-10-13 LAB — COMPREHENSIVE METABOLIC PANEL
ALT: 23 U/L (ref 0–44)
AST: 27 U/L (ref 15–41)
Albumin: 2.5 g/dL — ABNORMAL LOW (ref 3.5–5.0)
Alkaline Phosphatase: 83 U/L (ref 38–126)
Anion gap: 11 (ref 5–15)
BUN: 18 mg/dL (ref 8–23)
CO2: 24 mmol/L (ref 22–32)
Calcium: 10.6 mg/dL — ABNORMAL HIGH (ref 8.9–10.3)
Chloride: 100 mmol/L (ref 98–111)
Creatinine, Ser: 0.88 mg/dL (ref 0.61–1.24)
GFR calc Af Amer: >60 mL/min (ref >60)
GFR calc non Af Amer: >60 mL/min (ref >60)
Glucose, Bld: 110 mg/dL — ABNORMAL HIGH (ref 70–99)
Potassium: 4.1 mmol/L (ref 3.5–5.1)
Sodium: 135 mmol/L (ref 135–145)
Total Bilirubin: 1.1 mg/dL (ref 0.3–1.2)
Total Protein: 8.3 g/dL — ABNORMAL HIGH (ref 6.5–8.1)

## 2018-10-13 LAB — TSH: TSH: 2.785 u[IU]/mL (ref 0.350–4.500)

## 2018-10-13 LAB — LACTIC ACID, PLASMA
LACTIC ACID, VENOUS: 1.9 mmol/L (ref 0.5–1.9)
Lactic Acid, Venous: 2.4 mmol/L (ref 0.5–1.9)

## 2018-10-13 LAB — INFLUENZA PANEL BY PCR (TYPE A & B)
Influenza A By PCR: NEGATIVE
Influenza B By PCR: NEGATIVE

## 2018-10-13 MED ORDER — SODIUM CHLORIDE 0.9 % IV SOLN
1000.0000 mL | INTRAVENOUS | Status: DC
  Administered 2018-10-13: 500 mL via INTRAVENOUS
  Administered 2018-10-14: 1000 mL via INTRAVENOUS

## 2018-10-13 MED ORDER — ENOXAPARIN SODIUM 40 MG/0.4ML ~~LOC~~ SOLN
40.0000 mg | SUBCUTANEOUS | Status: DC
  Administered 2018-10-13 – 2018-10-18 (×6): 40 mg via SUBCUTANEOUS
  Filled 2018-10-13 (×5): qty 0.4

## 2018-10-13 MED ORDER — ADULT MULTIVITAMIN W/MINERALS CH
1.0000 | ORAL_TABLET | Freq: Every day | ORAL | Status: DC
  Administered 2018-10-14 – 2018-10-19 (×4): 1 via ORAL
  Filled 2018-10-13 (×5): qty 1

## 2018-10-13 MED ORDER — SODIUM CHLORIDE 0.9 % IV BOLUS
1000.0000 mL | Freq: Once | INTRAVENOUS | Status: AC
  Administered 2018-10-13: 1000 mL via INTRAVENOUS

## 2018-10-13 MED ORDER — ACETAMINOPHEN 325 MG PO TABS: 650.0000 mg | ORAL_TABLET | Freq: Four times a day (QID) | ORAL | Status: DC | PRN

## 2018-10-13 MED ORDER — ACETAMINOPHEN 650 MG RE SUPP: 650.0000 mg | Freq: Four times a day (QID) | RECTAL | Status: DC | PRN

## 2018-10-13 MED ORDER — ENSURE ENLIVE PO LIQD
237.0000 mL | Freq: Two times a day (BID) | ORAL | Status: DC
  Administered 2018-10-14 – 2018-10-19 (×8): 237 mL via ORAL

## 2018-10-13 MED ORDER — LOSARTAN POTASSIUM 50 MG PO TABS
100.0000 mg | ORAL_TABLET | Freq: Every day | ORAL | Status: DC
  Administered 2018-10-14: 100 mg via ORAL
  Filled 2018-10-13 (×2): qty 2

## 2018-10-13 MED ORDER — SODIUM CHLORIDE 0.9% FLUSH
3.0000 mL | Freq: Two times a day (BID) | INTRAVENOUS | Status: DC
  Administered 2018-10-13 – 2018-10-19 (×8): 3 mL via INTRAVENOUS

## 2018-10-13 MED ORDER — SODIUM CHLORIDE 0.9 % IV SOLN
1.0000 g | Freq: Three times a day (TID) | INTRAVENOUS | Status: DC
  Administered 2018-10-14 (×2): 1 g via INTRAVENOUS
  Filled 2018-10-13 (×4): qty 1

## 2018-10-13 MED ORDER — VANCOMYCIN HCL 10 G IV SOLR
1250.0000 mg | Freq: Once | INTRAVENOUS | Status: AC
  Administered 2018-10-13: 1250 mg via INTRAVENOUS
  Filled 2018-10-13: qty 1250

## 2018-10-13 MED ORDER — VANCOMYCIN HCL IN DEXTROSE 1-5 GM/200ML-% IV SOLN
1000.0000 mg | INTRAVENOUS | Status: DC
  Filled 2018-10-13: qty 200

## 2018-10-13 MED ORDER — ERYTHROMYCIN 5 MG/GM OP OINT
1.0000 "application " | TOPICAL_OINTMENT | Freq: Once | OPHTHALMIC | Status: AC
  Administered 2018-10-13: 1 via OPHTHALMIC
  Filled 2018-10-13: qty 3.5

## 2018-10-13 MED ORDER — METRONIDAZOLE IN NACL 5-0.79 MG/ML-% IV SOLN
500.0000 mg | Freq: Three times a day (TID) | INTRAVENOUS | Status: DC
  Administered 2018-10-13 – 2018-10-14 (×3): 500 mg via INTRAVENOUS
  Filled 2018-10-13 (×3): qty 100

## 2018-10-13 MED ORDER — SODIUM CHLORIDE 0.9 % IV SOLN
2.0000 g | Freq: Once | INTRAVENOUS | Status: AC
  Administered 2018-10-13: 2 g via INTRAVENOUS
  Filled 2018-10-13: qty 2

## 2018-10-13 MED ORDER — SODIUM CHLORIDE 0.9% FLUSH
3.0000 mL | Freq: Two times a day (BID) | INTRAVENOUS | Status: DC
  Administered 2018-10-13 – 2018-10-19 (×10): 3 mL via INTRAVENOUS

## 2018-10-13 MED ORDER — SODIUM CHLORIDE 0.9 % IV SOLN
1000.0000 mL | INTRAVENOUS | Status: DC
  Administered 2018-10-13: 1000 mL via INTRAVENOUS

## 2018-10-13 MED ORDER — LEVOTHYROXINE SODIUM 75 MCG PO TABS
75.0000 ug | ORAL_TABLET | Freq: Every day | ORAL | Status: DC
  Administered 2018-10-14 – 2018-10-19 (×5): 75 ug via ORAL
  Filled 2018-10-13 (×5): qty 1

## 2018-10-13 MED ORDER — VANCOMYCIN HCL IN DEXTROSE 1-5 GM/200ML-% IV SOLN: 1000.0000 mg | Freq: Once | INTRAVENOUS | Status: DC

## 2018-10-13 MED ORDER — SODIUM CHLORIDE 0.9% FLUSH: 3.0000 mL | INTRAVENOUS | Status: DC | PRN

## 2018-10-13 MED ORDER — SODIUM CHLORIDE 0.9 % IV SOLN: 250.0000 mL | INTRAVENOUS | Status: DC | PRN

## 2018-10-13 NOTE — ED Provider Notes (Addendum)
[facility] EMERGENCY DEPARTMENT Provider Note   CSN: [id number] Arrival date & time: 10/13/18  1234    History   Chief Complaint Chief Complaint  Patient presents with  . Weakness  . cold-like symptoms    HPI Patrick Rhodes is a 85 y.o. male with history of hypertension, hypothyroidism, stroke who presents with a one-week history of nasal congestion and 3-day history of altered mental status and generalized weakness.  Patient is normally active and a lot more independent and he has not been able to walk for the past 2 days.  He has been more confused than normal, per his stepdaughter who is a caregiver.  Patient began having a nosebleed and upper respiratory congestion about 1 week ago and [facility]  Patient was found to have elevated white blood cell count, however they were watching it.  They did not start any antibiotics.  Denies any significant pain.     HPI  Past Medical History:  Diagnosis Date  . High cholesterol   . Hypertension   . Hypothyroidism   . Stroke [facility])     There are no active problems to display for this patient.   History reviewed. No pertinent surgical history.      Home Medications    Prior to Admission medications   Medication Sig Start Date End Date Taking? Authorizing Provider  levothyroxine (SYNTHROID, LEVOTHROID) 75 MCG tablet Take 75 mcg by mouth daily before breakfast.   Yes [provider]  losartan (COZAAR) 100 MG tablet Take 100 mg by mouth daily.   Yes [provider]  Multiple Vitamin (MULTIVITAMIN WITH MINERALS) TABS tablet Take 1 tablet by mouth daily.   Yes [provider]  aspirin EC 81 MG tablet Take 81 mg by mouth daily.    [provider]    Family History No family history on file.  Social History Social History   Tobacco Use  . Smoking status: Never Smoker  . Smokeless tobacco: Never Used  Substance Use Topics  . Alcohol use: No  . Drug  use: No     Allergies   Patient has no known allergies.   Review of Systems Review of Systems  Unable to perform ROS: Mental status change     Physical Exam Updated Vital Signs BP (!) 146/98   Temp 99.6 F (37.6 C) (Rectal)   Resp 17   Wt 62.1 kg   SpO2 94%   Physical Exam Vitals signs and nursing note reviewed.  Constitutional:      General: He is not in acute distress.    Appearance: He is well-developed. He is not diaphoretic.     Comments: Thin, cachectic appearing  HENT:     Head: Normocephalic and atraumatic.     Mouth/Throat:     Mouth: Mucous membranes are dry.     Pharynx: No oropharyngeal exudate.  Eyes:     General: No scleral icterus.       Right eye: No discharge.        Left eye: No discharge.     Conjunctiva/sclera: Conjunctivae normal.     Pupils: Pupils are equal, round, and reactive to light.     Comments: Greenish-yellow discharge from the right eye  Neck:     Musculoskeletal: Normal range of motion and neck supple.     Thyroid: No thyromegaly.  Cardiovascular:     Rate and Rhythm: Normal rate and regular rhythm.  Heart sounds: Normal heart sounds. No murmur. No friction rub. No gallop.   Pulmonary:     Effort: Pulmonary effort is normal. No respiratory distress.     Breath sounds: No stridor. Decreased breath sounds present. No wheezing or rales.  Abdominal:     General: Bowel sounds are normal. There is no distension.     Palpations: Abdomen is soft.     Tenderness: There is no abdominal tenderness. There is no guarding or rebound.  Musculoskeletal:     Comments: Chronic cyst/lipoma on the left lateral thigh  Lymphadenopathy:     Cervical: No cervical adenopathy.  Skin:    General: Skin is warm and dry.     Coloration: Skin is not pale.     Findings: No rash.  Neurological:     Mental Status: He is alert.     Coordination: Coordination normal.      ED Treatments / Results  Labs (all labs ordered are listed, but only  abnormal results are displayed) Labs Reviewed  LACTIC ACID, PLASMA - Abnormal; Notable for the following components:      Result Value   Lactic Acid, Venous 2.4 (*)    All other components within normal limits  COMPREHENSIVE METABOLIC PANEL - Abnormal; Notable for the following components:   Glucose, Bld 110 (*)    Calcium 10.6 (*)    Total Protein 8.3 (*)    Albumin 2.5 (*)    All other components within normal limits  CBC WITH DIFFERENTIAL/PLATELET - Abnormal; Notable for the following components:   WBC 13.6 (*)    RBC 3.88 (*)    Hemoglobin 10.9 (*)    HCT 34.9 (*)    Neutro Abs 11.4 (*)    All other components within normal limits  CULTURE, BLOOD (ROUTINE X 2)  CULTURE, BLOOD (ROUTINE X 2)  TSH  LACTIC ACID, PLASMA  CBC WITH DIFFERENTIAL/PLATELET  URINALYSIS, ROUTINE W REFLEX MICROSCOPIC  INFLUENZA PANEL BY PCR (TYPE A & B)    EKG EKG Interpretation  Date/Time:  Saturday October 13 2018 12:45:44 EST Ventricular Rate:  103 PR Interval:    QRS Duration: 87 QT Interval:  339 QTC Calculation: 444 R Axis:   77 Text Interpretation:  Sinus tachycardia Atrial premature complexes Baseline wander in lead(s) V1 Confirmed by Gerlene Fee [phone]) on 10/13/2018 2:39:37 PM   Radiology Dg Chest Port 1 View  Result Date: 10/13/2018 CLINICAL DATA:  Altered mental status.  Weakness. EXAM: PORTABLE CHEST 1 VIEW COMPARISON:  06/19/2007 FINDINGS: Few densities at the right lung base are nonspecific but may represent atelectasis. Upper lungs are clear. Cardiomediastinal silhouette is slightly enlarged but may be accentuated by AP portable technique. Probable scarring at the lung apices. IMPRESSION: Few basilar densities may represent atelectasis. Otherwise, no focal lung disease. Electronically Signed   By: Markus Daft M.D.   On: 10/13/2018 14:09    Procedures .Critical Care Performed by: Frederica Kuster, PA-C Authorized by: Frederica Kuster, PA-C   Critical care provider statement:     Critical care time (minutes):  32   Critical care was necessary to treat or prevent imminent or life-threatening deterioration of the following conditions:  Sepsis and dehydration   Critical care was time spent personally by me on the following activities:  Discussions with consultants, evaluation of patient's response to treatment, examination of patient, ordering and performing treatments and interventions, ordering and review of laboratory studies, ordering and review of radiographic studies, pulse oximetry, re-evaluation of patient's  condition, obtaining history from patient or surrogate and review of old charts   I assumed direction of critical care for this patient from another provider in my specialty: yes     (including critical care time)  Medications Ordered in ED Medications  0.9 %  sodium chloride infusion (1,000 mLs Intravenous New Bag/Given 10/13/18 1336)  ceFEPIme (MAXIPIME) 2 g in sodium chloride 0.9 % 100 mL IVPB (2 g Intravenous New Bag/Given 10/13/18 1527)  metroNIDAZOLE (FLAGYL) IVPB 500 mg (has no administration in time range)  sodium chloride 0.9 % bolus 1,000 mL (1,000 mLs Intravenous New Bag/Given 10/13/18 1527)  vancomycin (VANCOCIN) 1,250 mg in sodium chloride 0.9 % 250 mL IVPB (has no administration in time range)  vancomycin (VANCOCIN) IVPB 1000 mg/200 mL premix (has no administration in time range)  ceFEPIme (MAXIPIME) 1 g in sodium chloride 0.9 % 100 mL IVPB (has no administration in time range)     Initial Impression / Assessment and Plan / ED Course  I have reviewed the triage vital signs and the nursing notes.  Pertinent labs & imaging results that were available during my care of the patient were reviewed by me and considered in my medical decision making (see chart for details).        Patient presenting with sepsis of unknown origin.  Chest x-ray is clear.  Lactic is 2.4.  CBC shows WBC 13.6.  Blood cultures pending.  Antibiotics for unknown source  initiated, including cefepime, vancomycin, Flagyl.  Erythromycin topical initiated for right discharge. Fluids initiated.  Urine is pending.  I discussed patient case with the internal medicine teaching service who accepts patient for admission.  I appreciate their assistance with the patient.  Patient also evaluated by my attending, Dr. Sedonia Small, who guided the patient's management and agrees with plan.  Final Clinical Impressions(s) / ED Diagnoses   Final diagnoses:  Sepsis, due to unspecified organism, unspecified whether acute organ dysfunction present [facility])    ED Discharge Orders    None          Frederica Kuster, PA-C 10/13/18 1529    Maudie Flakes, MD 10/13/18 [phone]

## 2018-10-13 NOTE — ED Triage Notes (Signed)
Pt in from home via [facility] with mucous/cold symptoms x 1 wk. Family states he has been unable to walk x 2 days. A&ox3, more confusion than normal per family

## 2018-10-13 NOTE — ED Notes (Signed)
EKG exported in chart, EKG paper hasn't been given to Dr. Due to printer not working.

## 2018-10-13 NOTE — Progress Notes (Signed)
Pharmacy Antibiotic Note  Patrick Rhodes is a 85 y.o. male admitted on 10/13/2018 with mucous/cold symptoms for 1 week PTA.  He was unable to walk for the past 2 days.  Pharmacy has been consulted for vancomycin and cefepime dosing for sepsis of unknown origin.  He is also started on Flagyl.  SCr 0.88, nCrCL 61 ml/min, afebrile, LA 2.4.  Plan: Vanc 1250mg  IV x 1, then 1gm IV Q24H for AUC 463 using SCr 0.88 Cefepime 2gm IV x 1, then 1gm IV Q8H Flagyl 500mg  IV Q8H per MD Monitor renal fxn, clinical progress, vanc AUC if indicated  F/U updated height (about 5'9" per RN, patient not alert enough)   Weight: 136 lb 14.5 oz (62.1 kg)  Temp (24hrs), Avg:99.6 F (37.6 C), Min:99.6 F (37.6 C), Max:99.6 F (37.6 C)  Recent Labs  Lab 10/13/18 1253 10/13/18 1326  CREATININE 0.88  --   LATICACIDVEN  --  2.4*    CrCl cannot be calculated (Unknown ideal weight.).    No Known Allergies   Vanc 2/22 >> Cefepime 2/22 >> Flagyl 2/22 >>  2/22 BCx -    Bard Haupert D. Mina Marble, PharmD, BCPS, [place] 10/13/2018, 2:39 PM

## 2018-10-13 NOTE — H&P (Addendum)
Date: 10/13/2018               Patient Name:  Patrick Rhodes MRN: [id number]  DOB: 05-14-1930 Age / Sex: 85 y.o., male   PCP: System, Pcp Not In         Medical Service: Internal Medicine Teaching Service         Attending Physician: Dr. Oval Linsey, MD    First Contact: Dr. Donne Hazel Pager: [phone]  Second Contact: Dr. Trilby Drummer Pager: [phone]       After Hours (After 5p/  First Contact Pager: [phone]  weekends / holidays): Second Contact Pager: [phone]   Chief Complaint: weakness   History of Present Illness: 18yoM [facility] patient with HTN, hypothyroidism, stroke who presents with one week of worsening sinus congestion with purulent mucous production, bilateral eye discharge, decreased po intake, decreased energy, new urinary incontinence and confusion. Patrick Rhodes is alert but only oriented to self and does not follow commands. The history is given by the son who is at bedside.   Patrick Rhodes lives with his son and is normally fairly independent. He goes to [facility] M[facility] and has a nurse aid in the evening. Over the last week, the son notes symptoms of a sinus infection and that he hasn't wanted to get out of bed and has become too weak to walk on his own. The son also notes that Patrick Rhodes will make some confusing statements and has had increased body shakes. He normally eats two meals a day but recently has had no interest in food. He has been able to drink ensures BID, Gatorade, and water. The son took him to the [facility] doctor for evaluation 4 days ago when they noted an elevated WBC, normal UA and CXR. They diagnosed with a viral sinus infection and sent him home with flonase. His symptoms worsened so the son decided to bring him to the ED for a second opinion.    The son denies significant cough and has not noticed any complaints of abdominal pain but does report 3-4 weeks of bowel movements shaped like little balls.   In the ED, code sepsis was called given elevated lactate,  leukocytosis, low grade fevers. He was started on cefepime, metronidazole, and vancomycin and given 1L NS.   Meds:  Current Meds  Medication Sig  . levothyroxine (SYNTHROID, LEVOTHROID) 75 MCG tablet Take 75 mcg by mouth daily before breakfast.  . losartan (COZAAR) 100 MG tablet Take 100 mg by mouth daily.  . Multiple Vitamin (MULTIVITAMIN WITH MINERALS) TABS tablet Take 1 tablet by mouth daily.     Allergies: Allergies as of 10/13/2018  . (No Known Allergies)   Past Medical History:  Diagnosis Date  . High cholesterol   . Hypertension   . Hypothyroidism   . Stroke [facility])     Family History: Several family members with HTN and CAD   Social History: lives with son, goes to [facility] mon-fri, has nurse aid in the evenings. Distant smoking history. No alcohol or drug use.   Review of Systems: Limited due to patient's AMS  Physical Exam: Blood pressure (!) 165/80, pulse (!) 56, temperature 99.1 F (37.3 C), temperature source Oral, resp. rate 16, weight 59 kg, SpO2 90 %. Gen: thin, elderly appearing african [place] male, noncombative but with involuntary shaking of bilateral lower extremities. Exam is limited due to patient's cooperation  HENT: dry MM, bilateral eye discharge without conjunctival erythema Cardiac: RRR, no m/r/g, no JVD Pulm: difficult  to assess due to patient's lack of cooperation, normal wob on RA Abd: +BS, slightly firm, grimaces with palpation of suprapubic region  Ext: no LEE, left foot larger than the right without redness or pain, difficult to assess DP pulses due to patient's movement  Neuro: CN 2-12 grossly intact, unable to test sensation and strength due to patient's AMS. Moving all extremities equally    Labs: WBC 13.6 Hgb 10.9, normocytic  plts 314  CMP: slight hypercalcemia 10.6, ablumin 2.5, total protein 8.3  Lactate 2.4  TSH 2.78 Blood cultures pending   EKG: personally reviewed my interpretation is sinus tach HR 103, no acute ischemic  changes   CXR: personally reviewed my interpretation is slightly rotated, enlarged cardiac silhouette could be due to AP film, faint opacities in the bilateral bases favor atelectasis. No significant consolidation or effusion  Assessment & Plan by Problem: Active Problems:   Sepsis ([place])  39yoM [facility] patient with HTN, hypothyroidism, stroke who presents with one week of worsening sinus congestion with purulent mucous production, bilateral eye discharge, decreased po intake, decreased energy, new urinary incontinence and confusion.  Infection of unknown origin: Localizing symptoms include sinus drainage and suprapubic tenderness. CXR is not overly concerning for pneumonia. Low grade fevers, tachycardia in the low 100s, leukocytosis, elevated lactate. No evidence of skin breakdown on exam. UA has been difficult to collect given incontinence. I am suspicious for a UTI given suprapubic tenderness.  - f/u UA and culture  - f/u blood cultures  - continue broad spectrum antibiotics for now - erythromycin eye ointment  - DG chest 2 view   HTN: continue home losartan  Hypothyroid: continue home levothyroxine  DVT ppx: lovenox Code: Full, confirmed with family  FEN/GI: NS 50cc/hr, full liquid diet   Dispo: Admit patient to Inpatient with expected length of stay greater than 2 midnights.  Signed: Isabelle Course, MD 10/13/2018, 6:07 PM  Pager: [phone]

## 2018-10-14 ENCOUNTER — Inpatient Hospital Stay (HOSPITAL_COMMUNITY): Payer: Medicare (Managed Care)

## 2018-10-14 DIAGNOSIS — E039 Hypothyroidism, unspecified: Secondary | ICD-10-CM

## 2018-10-14 DIAGNOSIS — K5900 Constipation, unspecified: Secondary | ICD-10-CM

## 2018-10-14 DIAGNOSIS — K469 Unspecified abdominal hernia without obstruction or gangrene: Secondary | ICD-10-CM

## 2018-10-14 DIAGNOSIS — Z79890 Hormone replacement therapy (postmenopausal): Secondary | ICD-10-CM

## 2018-10-14 DIAGNOSIS — Z8673 Personal history of transient ischemic attack (TIA), and cerebral infarction without residual deficits: Secondary | ICD-10-CM

## 2018-10-14 DIAGNOSIS — J181 Lobar pneumonia, unspecified organism: Secondary | ICD-10-CM

## 2018-10-14 DIAGNOSIS — J189 Pneumonia, unspecified organism: Secondary | ICD-10-CM | POA: Diagnosis present

## 2018-10-14 DIAGNOSIS — B999 Unspecified infectious disease: Secondary | ICD-10-CM | POA: Diagnosis present

## 2018-10-14 DIAGNOSIS — Z79899 Other long term (current) drug therapy: Secondary | ICD-10-CM

## 2018-10-14 DIAGNOSIS — G9349 Other encephalopathy: Secondary | ICD-10-CM

## 2018-10-14 DIAGNOSIS — R32 Unspecified urinary incontinence: Secondary | ICD-10-CM

## 2018-10-14 DIAGNOSIS — R451 Restlessness and agitation: Secondary | ICD-10-CM

## 2018-10-14 LAB — URINALYSIS, ROUTINE W REFLEX MICROSCOPIC
BILIRUBIN URINE: NEGATIVE
Glucose, UA: NEGATIVE mg/dL
Hgb urine dipstick: NEGATIVE
KETONES UR: NEGATIVE mg/dL
LEUKOCYTE UA: NEGATIVE
NITRITE: NEGATIVE
PH: 6 (ref 5.0–8.0)
PROTEIN: NEGATIVE mg/dL
Specific Gravity, Urine: 1.011 (ref 1.005–1.030)

## 2018-10-14 LAB — BASIC METABOLIC PANEL
Anion gap: 10 (ref 5–15)
BUN: 14 mg/dL (ref 8–23)
CO2: 23 mmol/L (ref 22–32)
Calcium: 9.8 mg/dL (ref 8.9–10.3)
Chloride: 104 mmol/L (ref 98–111)
Creatinine, Ser: 0.77 mg/dL (ref 0.61–1.24)
GFR calc Af Amer: >60 mL/min (ref >60)
GFR calc non Af Amer: >60 mL/min (ref >60)
Glucose, Bld: 114 mg/dL — ABNORMAL HIGH (ref 70–99)
Potassium: 3.7 mmol/L (ref 3.5–5.1)
Sodium: 137 mmol/L (ref 135–145)

## 2018-10-14 LAB — CBC
HCT: 30.6 % — ABNORMAL LOW (ref 39.0–52.0)
Hemoglobin: 9.6 g/dL — ABNORMAL LOW (ref 13.0–17.0)
MCH: 27.9 pg (ref 26.0–34.0)
MCHC: 31.4 g/dL (ref 30.0–36.0)
MCV: 89 fL (ref 80.0–100.0)
Platelets: 282 10*3/uL (ref 150–400)
RBC: 3.44 MIL/uL — ABNORMAL LOW (ref 4.22–5.81)
RDW: 11.5 % (ref 11.5–15.5)
WBC: 9.7 10*3/uL (ref 4.0–10.5)
nRBC: 0 % (ref 0.0–0.2)

## 2018-10-14 LAB — GLUCOSE, CAPILLARY
Glucose-Capillary: 65 mg/dL — ABNORMAL LOW (ref 70–99)
Glucose-Capillary: 106 mg/dL — ABNORMAL HIGH (ref 70–99)
Glucose-Capillary: 66 mg/dL — ABNORMAL LOW (ref 70–99)
Glucose-Capillary: 80 mg/dL (ref 70–99)

## 2018-10-14 MED ORDER — SODIUM CHLORIDE 0.9 % IV SOLN
1.0000 g | Freq: Three times a day (TID) | INTRAVENOUS | Status: DC
  Administered 2018-10-14 – 2018-10-15 (×3): 1 g via INTRAVENOUS
  Filled 2018-10-14 (×4): qty 1

## 2018-10-14 MED ORDER — DEXTROSE 50 % IV SOLN
INTRAVENOUS | Status: AC
  Administered 2018-10-14: 50 mL
  Filled 2018-10-14: qty 50

## 2018-10-14 MED ORDER — AMOXICILLIN-POT CLAVULANATE 875-125 MG PO TABS: 1.0000 | ORAL_TABLET | Freq: Two times a day (BID) | ORAL | Status: DC

## 2018-10-14 MED ORDER — POLYETHYLENE GLYCOL 3350 17 G PO PACK
17.0000 g | PACK | Freq: Every day | ORAL | Status: DC
  Administered 2018-10-14: 17 g via ORAL
  Filled 2018-10-14: qty 1

## 2018-10-14 MED ORDER — AMOXICILLIN-POT CLAVULANATE ER 1000-62.5 MG PO TB12: 2.0000 | ORAL_TABLET | Freq: Two times a day (BID) | ORAL | Status: DC

## 2018-10-14 MED ORDER — VANCOMYCIN HCL 10 G IV SOLR
1250.0000 mg | INTRAVENOUS | Status: DC
  Administered 2018-10-14: 1250 mg via INTRAVENOUS
  Filled 2018-10-14: qty 1250

## 2018-10-14 MED ORDER — SENNOSIDES-DOCUSATE SODIUM 8.6-50 MG PO TABS
1.0000 | ORAL_TABLET | Freq: Two times a day (BID) | ORAL | Status: DC
  Administered 2018-10-14 – 2018-10-19 (×8): 1 via ORAL
  Filled 2018-10-14 (×9): qty 1

## 2018-10-14 NOTE — H&P (Signed)
Internal Medicine Attending Admission Note Date: 10/14/2018  Patient name: Patrick Rhodes Medical record number: [id number] Date of birth: 1930-01-04 Age: 85 y.o. Gender: male  I saw and evaluated the patient. I reviewed the resident's note and I agree with the resident's findings and plan as documented in the resident's note.  Chief Complaint(s): Confusion, urinary incontinence, decreased oral intake, and generalized fatigue x1 week.  History - key components related to admission:  [name] is an 85 year old man with a history of prior cerebrovascular accident, hypothyroidism, and hypertension who lives with his son and [facility].  He generally is ambulatory and independent but over the last week his son noted some sinus congestion that was followed by increasing fatigue and weakness so that [name] would spend most of his time in bed.  He also developed confusion, body shakes, and urinary incontinence.  His appetite was decreased from baseline.  4 days prior to admission he was evaluated by the [facility] physician who apparently diagnosed a viral sinusitis and recommended Flonase.  Per report his sinus symptoms improved transiently but then his symptoms worsened once again and the patient was brought to the emergency department for further evaluation.  Interview of the son revealed any significant cough or shortness of breath and no complaints of abdominal pain although there has been a 3 to 4-week history of decreased bowel movements with hard stool.  The patient was admitted to the internal medicine teaching service for further evaluation and care.  Physical Exam - key components related to admission:  Vitals:   10/13/18 1758 10/13/18 2156 10/14/18 0438 10/14/18 1328  BP: (!) 165/80 (!) 157/91 (!) 147/83 (!) 169/87  Pulse: (!) 56 90 87 (!) 43  Resp: '16 18  20  '$ Temp: 99.1 F (37.3 C) 98.7 F (37.1 C) 98.1 F (36.7 C) 99.5 F (37.5 C)  TempSrc: Oral Axillary  Axillary Oral  SpO2: 90% 92% 94% 95%  Weight:       General: Well-developed, thin, man lying flat in bed irritated incessantly about his mittens.  He is difficult to redirect but is very specific in his requests and cognitive of his surroundings.  He moves all 4 extremities without difficulty and spontaneously.  His sensation seemed to be intact during the evaluation throughout. HEENT:  Normocephalic, no maxillary or frontal sinus tenderness to palpation. Lungs: I did not personally listen to his lungs posteriorly as I was holding him up so the other physicians could auscultate his lungs. Abdomen: Soft, inconsistently tender, with a right femoral hernia that was easily reducible and nontender. Extremities: Without edema. Skin: No appreciable rashes or ulcers over the torso and in the perineal area.  We were unable to get him to change positions to assess the sacral area.  Lab results:  Basic Metabolic Panel: Recent Labs    10/13/18 1253 10/14/18 0555  NA 135 137  K 4.1 3.7  CL 100 104  CO2 24 23  GLUCOSE 110* 114*  BUN 18 14  CREATININE 0.88 0.77  CALCIUM 10.6* 9.8   Liver Function Tests: Recent Labs    10/13/18 1253  AST 27  ALT 23  ALKPHOS 83  BILITOT 1.1  PROT 8.3*  ALBUMIN 2.5*   CBC: Recent Labs    10/13/18 1406 10/14/18 0555  WBC 13.6* 9.7  NEUTROABS 11.4*  --   HGB 10.9* 9.6*  HCT 34.9* 30.6*  MCV 89.9 89.0  PLT 314 282   CBG: Recent Labs  10/14/18 0439 10/14/18 0601  GLUCAP 65* 106*   Thyroid Function Tests: Recent Labs    10/13/18 1406  TSH 2.785   Urinalysis:  Clear yellow, specific gravity 1.011, pH 6.0, protein negative, nitrite negative, leukocytes negative  Misc. Labs:  Blood cultures x2 no growth to date Urine culture pending Lactic acid 2.4 -> 1.9 Influenza A negative Influenza B negative Intact PTH pending  Imaging results:   Portable chest x-ray: Personally reviewed.  No effusions, infiltrates, or masses.  No  significant changes from the previous chest x-ray on June 19, 2007.  PA and lateral chest x-ray: Personally reviewed.  Opacity in the superior subsegment of the left lower lobe best seen on the lateral.  Air-fluid level and an abdominal viscus, possibly the stomach.  Again, best seen on the lateral view.  Other results:  EKG: Personally reviewed.  Normal sinus rhythm at 92 bpm, normal axis, normal intervals, no significant Q waves, no LVH by voltage, good R wave progression, no ST or T wave changes.  No significant changes from the ECG on September 13, 2015.  Assessment & Plan by Problem:  [name] is an 85 year old man with a history of prior cerebrovascular accident, hypothyroidism, and hypertension who lives with his son and [facility].  He generally is ambulatory and independent but over the last week his son noted some sinus congestion that was followed by increasing fatigue and weakness so that [name] would spend most of his time in bed.  He also developed confusion, body shakes, and urinary incontinence.  The cause of his decline over the last week is unclear.  His AP portable chest x-ray was relatively unremarkable but his PA & lateral was concerning for a left lower lobe superior subsegmental infiltrate and possible ileus or obstruction.  It is unclear if he has an aspiration pneumonia that is a result of the confusion or a cause of the confusion.  The air-fluid level is also interesting given the hernia that was detected on examination today.  The location was suggestive of a femoral hernia although it was easily reducible on examination.  This took place after the PA and lateral chest x-ray.  The sinus congestion could also hint to a bacterial sinusitis that is a superinfection from the prior viral sinusitis.  That said, my suspicion for this as a cause of his symptoms is low as he did not have sinus tenderness on my examination this morning.  There has been  reports over the last month of hard stool and constipation can cause confusion in the elderly.  Finally, his mild hypercalcemia does not appear to be at a level that would be consistent with his presentation of confusion and constipation, but does require further evaluation as it most likely represents primary hyperparathyroidism.  With the protein albumin gap 1 also has to consider the possibility of multiple myeloma although a assessment of the osseous structures on the radiography has obtained thus far has been negative for any lesions in his renal function is intact.  In addition, acute delirium from multiple myeloma would not be very likely.  With hydration the calcium level has gone down although we do not have an albumin from this morning to correct the level.  1) Left lower lobe superior subsegmental pneumonia: We will continue with the broad-spectrum antibiotics with the exception of the Flagyl for the pneumonia.  We will follow-up on the blood cultures.  We will also follow his response  with regards to mental status with treatment of this pneumonia.  2) Possible ileus: We are obtaining an upright abdominal film to look for air-fluid levels and I have asked that the pubis symphysis be included given the hernia found on examination which was easily reducible.  If he is found to have an ileus or obstruction this too may be the cause of his presentation and could be the initial precipitator.  This will be better assessed once we have the upright abdominal film.  3) Hypercalcemia: We are assessing for primary hyperparathyroidism with an intact PTH level.  With hydration his calcium has improved.  I am unsure if the multivitamin he is taking has calcium within it but we will hold this until this issue is sorted out.  4) Hard stools: We will start a bowel regimen including stool softeners if he does not have an ileus or obstruction upon further evaluation, in case this is contributing to the  delirium.  5) Toxic or infectious encephalopathy: We are treating the presumed left lower lobe superior subsegmental pneumonia, hypercalcemia, hard stools, and assessing for an ileus.  We are looking for other potential sources as noted above for the encephalopathy.  We will try to limit the monitoring wires and will discontinue the cardiac monitor and maintenance IV fluids if he is able to keep himself orally hydrated.  When seen on rounds this morning he was most upset and agitated about the mittens.  I am concerned they are causing more harm given his encephalopathy than they are helping him.  This is especially true if we can eliminate monitors that are unnecessary and would not worry about him pulling them off if they were in fact not attached.  We will continue to redirect him and have opened the shades this morning to assure he remains on a schedule of day and night as best as possible in this confusing and unfamiliar environment.  6) Disposition: Pending resolution of his encephalopathy.  Evaluation is still underway to figure out the potential cause and thus resolution is unlikely in the next 24 to 48 hours until a treatable cause is determined and addressed.

## 2018-10-14 NOTE — Progress Notes (Addendum)
Subjective: Mr Patrick Rhodes was seen on morning rounds. He was alert and oriented to self. He has some agitation by mittens, which had been placed due to him pulling at lines or foley. He appears improved an in no acute distress; able to participate in exam.  Objective:  Vital signs in last 24 hours: Vitals:   10/13/18 1726 10/13/18 1758 10/13/18 2156 10/14/18 0438  BP: (!) 151/80 (!) 165/80 (!) 157/91 (!) 147/83  Pulse: 89 (!) 56 90 87  Resp: 14 16 18    Temp:  99.1 F (37.3 C) 98.7 F (37.1 C) 98.1 F (36.7 C)  TempSrc:  Oral Axillary Axillary  SpO2: 100% 90% 92% 94%  Weight:       Physical Exam Constitutional:      General: He is not in acute distress. HENT:     Head:     Comments: No sinus tenderness    Mouth/Throat:     Mouth: Mucous membranes are moist.     Pharynx: Oropharynx is clear.  Neck:     Musculoskeletal: No neck rigidity.  Cardiovascular:     Rate and Rhythm: Normal rate and regular rhythm.     Heart sounds: Normal heart sounds.  Pulmonary:     Effort: Pulmonary effort is normal. No respiratory distress.     Breath sounds: Normal breath sounds.  Abdominal:     General: Abdomen is flat. Bowel sounds are normal.     Palpations: Abdomen is soft.     Hernia: A hernia (R ?Indirect Inguinal Hernia. Easily reducible) is present.  Musculoskeletal:     Right lower leg: No edema.     Left lower leg: No edema.  Skin:    General: Skin is warm and dry.  Neurological:     Mental Status: He is alert.    Assessment/Plan:  Active Problems:   Sepsis ([place])  89yoM [facility] patient with HTN, hypothyroidism, stroke who presents with one week of worsening sinus congestion with purulent mucous production, bilateral eye discharge, decreased po intake, decreased energy, new urinary incontinence and confusion.  Infection of unknown origin: Localizing symptoms include recent sinus drainage and ?suprapubic tenderness. Low grade fevers, tachycardia in the low 100s, leukocytosis,  elevated lactate on admission. CXR and UI/A negative for source. No evidence of skin breakdown on exam. Will narrow antibiotics to cover for Sinus source as this is the only source with localizing signs at this time with higher dose Augmentin given increased risk of pneumococcal resistance given patients age. Can consider Sinus CT if symptoms worsen or fail to improve, but not warranted at this time.  - F/U blood cultures  - Narrow Abx to Augmentin 2g-125mg  BID - Erythromycin eye ointment  > ADDENDUM: 2 View CXR concerning for consolidation in superior subsegment of LLL. Will broaden Abx back to Vanc and Cefepime  Hypercalcemia: Correct Ca on admission >12, it has been elevated chronically per chart review. This can cause mental status changes though unlikely at this level. It is improved after IVF overnight from uncorrected of 10.6 to 9.8. Will check PTH to screen primary hyperparathyroidism. - PTH  Constipation: Reported constipation at home with smaller, hard stools. Will start bowl regimen as constipation can also contribute to AMS in the geriatric population. - Senna BID, Miralax Daily > ADDENDUM: 2 View CXR with air fluid level on lateral. Will obtain KUB.  HTN: Continue home Losartan  Hypothyroid: Continue home Levothyroxine  DVT ppx: lovenox Code: Full, confirmed with family  FEN/GI: None, regular diet  Dispo: Anticipated discharge pending clinical course.   Neva Seat, MD 10/14/2018, 12:53 PM Pager: [phone]

## 2018-10-14 NOTE — Progress Notes (Signed)
Pt's CBG assessed randomly and noted to be 65. Pt currently alert showing no signs of distress. RN provided orange juice to the patient, and reassessed CBG 15 mins later and noted CBG as 105. Pt alert, showing no signs of distress, and being transported to scheduled x-ray. RN to continue to monitor.

## 2018-10-14 NOTE — Progress Notes (Signed)
Pt's cbg noted to be 65 upon random check. Pt alert, showing no signs of distress. 25 mL of D5 administered per protocol. RN to reassess CBG and continue to monitor.

## 2018-10-15 ENCOUNTER — Inpatient Hospital Stay (HOSPITAL_COMMUNITY): Payer: Medicare (Managed Care)

## 2018-10-15 DIAGNOSIS — R471 Dysarthria and anarthria: Secondary | ICD-10-CM

## 2018-10-15 DIAGNOSIS — R4182 Altered mental status, unspecified: Secondary | ICD-10-CM

## 2018-10-15 DIAGNOSIS — R4189 Other symptoms and signs involving cognitive functions and awareness: Secondary | ICD-10-CM

## 2018-10-15 DIAGNOSIS — R531 Weakness: Secondary | ICD-10-CM

## 2018-10-15 DIAGNOSIS — Z7982 Long term (current) use of aspirin: Secondary | ICD-10-CM

## 2018-10-15 DIAGNOSIS — I639 Cerebral infarction, unspecified: Secondary | ICD-10-CM | POA: Diagnosis present

## 2018-10-15 DIAGNOSIS — I63542 Cerebral infarction due to unspecified occlusion or stenosis of left cerebellar artery: Secondary | ICD-10-CM

## 2018-10-15 LAB — COMPREHENSIVE METABOLIC PANEL
ALT: 18 U/L (ref 0–44)
AST: 22 U/L (ref 15–41)
Albumin: 2.2 g/dL — ABNORMAL LOW (ref 3.5–5.0)
Alkaline Phosphatase: 67 U/L (ref 38–126)
Anion gap: 8 (ref 5–15)
BUN: 11 mg/dL (ref 8–23)
CO2: 25 mmol/L (ref 22–32)
Calcium: 10.2 mg/dL (ref 8.9–10.3)
Chloride: 103 mmol/L (ref 98–111)
Creatinine, Ser: 0.85 mg/dL (ref 0.61–1.24)
GFR calc Af Amer: >60 mL/min (ref >60)
GFR calc non Af Amer: >60 mL/min (ref >60)
Glucose, Bld: 89 mg/dL (ref 70–99)
Potassium: 3.8 mmol/L (ref 3.5–5.1)
Sodium: 136 mmol/L (ref 135–145)
Total Bilirubin: 1.1 mg/dL (ref 0.3–1.2)
Total Protein: 7.4 g/dL (ref 6.5–8.1)

## 2018-10-15 LAB — CBC
HCT: 33.1 % — ABNORMAL LOW (ref 39.0–52.0)
Hemoglobin: 10.7 g/dL — ABNORMAL LOW (ref 13.0–17.0)
MCH: 28 pg (ref 26.0–34.0)
MCHC: 32.3 g/dL (ref 30.0–36.0)
MCV: 86.6 fL (ref 80.0–100.0)
Platelets: 344 10*3/uL (ref 150–400)
RBC: 3.82 MIL/uL — ABNORMAL LOW (ref 4.22–5.81)
RDW: 11.3 % — ABNORMAL LOW (ref 11.5–15.5)
WBC: 12 10*3/uL — AB (ref 4.0–10.5)
nRBC: 0 % (ref 0.0–0.2)

## 2018-10-15 LAB — GLUCOSE, CAPILLARY: Glucose-Capillary: 78 mg/dL (ref 70–99)

## 2018-10-15 LAB — URINE CULTURE: Culture: NO GROWTH

## 2018-10-15 LAB — PARATHYROID HORMONE, INTACT (NO CA): PTH: 53 pg/mL (ref 15–65)

## 2018-10-15 MED ORDER — POLYETHYLENE GLYCOL 3350 17 G PO PACK
17.0000 g | PACK | Freq: Two times a day (BID) | ORAL | Status: DC
  Administered 2018-10-15 – 2018-10-19 (×7): 17 g via ORAL
  Filled 2018-10-15 (×9): qty 1

## 2018-10-15 MED ORDER — SODIUM CHLORIDE 0.9 % IV SOLN
2.0000 g | INTRAVENOUS | Status: DC
  Administered 2018-10-15 – 2018-10-17 (×3): 2 g via INTRAVENOUS
  Filled 2018-10-15 (×3): qty 20

## 2018-10-15 MED ORDER — SODIUM CHLORIDE 0.9 % IV SOLN: INTRAVENOUS | Status: AC

## 2018-10-15 MED ORDER — ASPIRIN 325 MG PO TABS
325.0000 mg | ORAL_TABLET | Freq: Every day | ORAL | Status: DC
  Administered 2018-10-15 – 2018-10-17 (×3): 325 mg via ORAL
  Filled 2018-10-15 (×3): qty 1

## 2018-10-15 MED ORDER — STROKE: EARLY STAGES OF RECOVERY BOOK
Freq: Once | Status: DC
  Filled 2018-10-15 (×3): qty 1

## 2018-10-15 NOTE — Progress Notes (Signed)
Patient was not in room upon assessment, patient has went down for the CT scan

## 2018-10-15 NOTE — Evaluation (Signed)
Clinical/Bedside Swallow Evaluation Patient Details  Name: Patrick Rhodes MRN: [id number] Date of Birth: [birth year]  Today's Date: 10/15/2018 Time: SLP Start Time (ACUTE ONLY): 1351 SLP Stop Time (ACUTE ONLY): 1413 SLP Time Calculation (min) (ACUTE ONLY): 22 min  Past Medical History:  Past Medical History:  Diagnosis Date  . High cholesterol   . Hypertension   . Hypothyroidism   . Stroke Patrick Rhodes)    Past Surgical History: History reviewed. No pertinent surgical history. HPI:  Pt is an 85 y.o. admitted with AMS. CT is suggestive of an acute/subacute L cerebellar infarct. MRI pending. PMH of CVA, Hypothyroidism, HTN and HLD   Assessment / Plan / Recommendation Clinical Impression  Pt's AMS impacts his awareness for bolus acceptance, requiring Max verbal and tactile cues. He swings at the feeder, but when his hands are rested down, he does open his mouth to accept POs. Intake is minimal, but he took two bites of puree and several consecutive straw sips of thin liquids x1 without overt signs of aspiration. Recommend adjusting diet to Dys 1 solids, thin liquids, meds crushed in puree. He will need full supervision to attempt intake, and suspect that intake will be significantly limited with his current mentation. SLP will continue to follow for tolerance and potential to advance. If MRI is positive for acute infarct, recommend SLP cognitive-linguistic evaluation be ordered. SLP Visit Diagnosis: Dysphagia, unspecified (R13.10)    Aspiration Risk  Moderate aspiration risk;Risk for inadequate nutrition/hydration    Diet Recommendation Dysphagia 1 (Puree);Thin liquid   Liquid Administration via: Straw Medication Administration: Crushed with puree Supervision: Staff to assist with self feeding;Full supervision/cueing for compensatory strategies Compensations: Minimize environmental distractions;Slow rate;Small sips/bites Postural Changes: Remain upright for at least 30 minutes after po  intake;Seated upright at 90 degrees    Other  Recommendations Oral Care Recommendations: Oral care BID Other Recommendations: Have oral suction available   Follow up Recommendations Skilled Nursing facility      Frequency and Duration min 2x/week  2 weeks       Prognosis Prognosis for Safe Diet Advancement: Fair Barriers to Reach Goals: Cognitive deficits      Swallow Study   General HPI: Pt is an 85 y.o. admitted with AMS. CT is suggestive of an acute/subacute L cerebellar infarct. MRI pending. PMH of CVA, Hypothyroidism, HTN and HLD Type of Study: Bedside Swallow Evaluation Previous Swallow Assessment: none in chart Diet Prior to this Study: Dysphagia 1 (puree);Thin liquids Temperature Spikes Noted: No Respiratory Status: Room air History of Recent Intubation: No Behavior/Cognition: Alert;Doesn't follow directions;Agitated Oral Cavity Assessment: Dry Oral Care Completed by SLP: No Oral Cavity - Dentition: (UTA) Self-Feeding Abilities: Total assist Patient Positioning: Upright in bed Baseline Vocal Quality: Normal Volitional Cough: Cognitively unable to elicit Volitional Swallow: Unable to elicit    Oral/Motor/Sensory Function Overall Oral Motor/Sensory Function: Other (comment)(not following commands to assess)   Ice Chips Ice chips: Not tested   Thin Liquid Thin Liquid: Impaired Presentation: Spoon;Straw Oral Phase Impairments: Poor awareness of bolus    Nectar Thick Nectar Thick Liquid: Not tested   Honey Thick Honey Thick Liquid: Not tested   Puree Puree: Impaired Presentation: Spoon Oral Phase Impairments: Poor awareness of bolus   Solid     Solid: Not tested      Patrick Rhodes 10/15/2018,2:46 PM  Patrick Rhodes, M.A. [place] Acute Environmental education officer [phone] Office [phone]

## 2018-10-15 NOTE — Consult Note (Signed)
Referring Physician: Dr. Rebeca Alert    Chief Complaint: Increased weakness and gait unsteadiness  HPI: Patrick Rhodes is an 85 y.o. male with hypothyroidism, HTN, vascular dementia and prior stroke who presented to the hospital with increasing fatigue, decreased appetite and weakness in the context of sinus congestion for approximately the past week. He also had worsened confusion, body aches, difficulty ambulating on his own and urinary incontinence. He may have had some lateralized weakness on the background of increased generalized weakness. A CT of the head was obtained, which revealed a medium sized inferior left cerebellar hypodensity suspicious for possible subacute stroke, which was new relative to the prior imaging study.   Problem list this admission also includes CAP versus atelectasis, as well as chronic hypercalcemia.   At baseline, he ambulates on his own but needs some help with bathing and [facility]. Regarding his speech, he stutters at baseline.   CT head: 1. Suspect acute/subacute nonhemorrhagic inferior left cerebellar infarct.  2. New from prior exam dated 09/13/15 is an infarct involving the mid aspect of the left cerebellum.  3. No intracranial hemorrhage. 4. Remote infarct left corona radiata, posterior limb left internal capsule and right lenticular nucleus. Prominent chronic microvascular changes. 5. Global atrophy. 6. Bubbly opacification right maxillary sinus with air-fluid level raises possibly of acute sinusitis. 7. Partial opacification right mastoid air cells and middle ear cavity. No obstructing lesion of eustachian tube noted.  Past Medical History:  Diagnosis Date  . High cholesterol   . Hypertension   . Hypothyroidism   . Stroke [facility])     History reviewed. No pertinent surgical history.  No family history on file. Social History:  reports that he has never smoked. He has never used smokeless tobacco. He reports that he does not drink  alcohol or use drugs.  Allergies: No Known Allergies  Medications:  Prior to Admission:  Medications Prior to Admission  Medication Sig Dispense Refill Last Dose  . levothyroxine (SYNTHROID, LEVOTHROID) 75 MCG tablet Take 75 mcg by mouth daily before breakfast.   10/13/2018 at Unknown time  . losartan (COZAAR) 100 MG tablet Take 100 mg by mouth daily.   10/13/2018 at Unknown time  . Multiple Vitamin (MULTIVITAMIN WITH MINERALS) TABS tablet Take 1 tablet by mouth daily.   10/13/2018 at Unknown time  . aspirin EC 81 MG tablet Take 81 mg by mouth daily.   on hold   Scheduled: .  stroke: mapping our early stages of recovery book   Does not apply Once  . aspirin  325 mg Oral Daily  . enoxaparin (LOVENOX) injection  40 mg Subcutaneous Q24H  . feeding supplement (ENSURE ENLIVE)  237 mL Oral BID BM  . levothyroxine  75 mcg Oral QAC breakfast  . multivitamin with minerals  1 tablet Oral Daily  . polyethylene glycol  17 g Oral BID  . senna-docusate  1 tablet Oral BID  . sodium chloride flush  3 mL Intravenous Q12H  . sodium chloride flush  3 mL Intravenous Q12H   Continuous: . cefTRIAXone (ROCEPHIN)  IV 2 g (10/15/18 1357)    ROS: Unable to obtain due to AMS  Physical Examination: Blood pressure (!) 153/88, pulse 93, temperature 98.3 F (36.8 C), temperature source Oral, resp. rate 18, weight 59 kg, SpO2 100 %.  HEENT: [place]/AT Lungs: Respirations unlabored Ext: No edema  Neurologic Examination: Ment: Awake. Confused. Dysarthric, perseverative speech is mostly unintelligible. The portions of his speech that are  intelligible consists of brief phrases or exclamations that are perseverative and bear no relationship to examiner's questions or external stimuli. Does not follow any commands. Does not fixate on examiner but eyes do move spontaneously. Does not appear to be attending to any objects in the room or other external visual stimuli.   CN: Closes eyes tightly when attempting to elicit  pupillary light reflex. Saccades present but does not track examiner or other moving visual stimuli. No nystagmus or forced gaze deviation. Mild swelling of left lower lip; otherwise no labial or perioral asymmetry at rest or with movement. Responds to tactile stimulation bilaterally. Does not protrude tongue to command.  Motor/Sensory: Moves upper extremities in response to pinch bilaterally. Arms remain elevated symmetrically for several seconds after being raised passively by the examiner. Reflexes: Does not hold still or relax limbs for elicitation of reflexes in upper or lower extremities.  Cerebellar: Unable to assess as not following commands Gait: Unable to assess   Results for orders placed or performed during the hospital encounter of 10/13/18 (from the past 48 hour(s))  Urinalysis, Routine w reflex microscopic     Status: None   Collection Time: 10/14/18  1:02 AM  Result Value Ref Range   Color, Urine YELLOW YELLOW   APPearance CLEAR CLEAR   Specific Gravity, Urine 1.011 1.005 - 1.030   pH 6.0 5.0 - 8.0   Glucose, UA NEGATIVE NEGATIVE mg/dL   Hgb urine dipstick NEGATIVE NEGATIVE   Bilirubin Urine NEGATIVE NEGATIVE   Ketones, ur NEGATIVE NEGATIVE mg/dL   Protein, ur NEGATIVE NEGATIVE mg/dL   Nitrite NEGATIVE NEGATIVE   Leukocytes,Ua NEGATIVE NEGATIVE    Comment: Performed at [facility] [address], [place], [place] [address]  Culture, Urine     Status: None   Collection Time: 10/14/18  1:02 AM  Result Value Ref Range   Specimen Description URINE, CLEAN CATCH    Special Requests NONE    Culture      NO GROWTH Performed at [facility], [place] [address], [place], [place] [address]    Report Status 10/15/2018 FINAL   Glucose, capillary     Status: Abnormal   Collection Time: 10/14/18  4:39 AM  Result Value Ref Range   Glucose-Capillary 65 (L) 70 - 99 mg/dL  Basic metabolic panel     Status: Abnormal   Collection Time: 10/14/18  5:55 AM  Result Value Ref  Range   Sodium 137 135 - 145 mmol/L   Potassium 3.7 3.5 - 5.1 mmol/L   Chloride 104 98 - 111 mmol/L   CO2 23 22 - 32 mmol/L   Glucose, Bld 114 (H) 70 - 99 mg/dL   BUN 14 8 - 23 mg/dL   Creatinine, Ser 0.77 0.61 - 1.24 mg/dL   Calcium 9.8 8.9 - 10.3 mg/dL   GFR calc non Af Amer >60 >60 mL/min   GFR calc Af Amer >60 >60 mL/min   Anion gap 10 5 - 15    Comment: Performed at [facility], [place] [address], [place] [address]  CBC     Status: Abnormal   Collection Time: 10/14/18  5:55 AM  Result Value Ref Range   WBC 9.7 4.0 - 10.5 K/uL   RBC 3.44 (L) 4.22 - 5.81 MIL/uL   Hemoglobin 9.6 (L) 13.0 - 17.0 g/dL   HCT 30.6 (L) 39.0 - 52.0 %   MCV 89.0 80.0 - 100.0 fL   MCH 27.9 26.0 -  34.0 pg   MCHC 31.4 30.0 - 36.0 g/dL   RDW 11.5 11.5 - 15.5 %   Platelets 282 150 - 400 K/uL   nRBC 0.0 0.0 - 0.2 %    Comment: Performed at [facility], [place] [address], [place], [place] [address]  Glucose, capillary     Status: Abnormal   Collection Time: 10/14/18  6:01 AM  Result Value Ref Range   Glucose-Capillary 106 (H) 70 - 99 mg/dL  Parathyroid hormone, intact (no Ca)     Status: None   Collection Time: 10/14/18  1:13 PM  Result Value Ref Range   PTH 53 15 - 65 pg/mL    Comment: (NOTE) Performed At: [facility] [place], [place] [id number] Rush Farmer MD [id number]   Glucose, capillary     Status: Abnormal   Collection Time: 10/14/18  8:54 PM  Result Value Ref Range   Glucose-Capillary 66 (L) 70 - 99 mg/dL  Glucose, capillary     Status: None   Collection Time: 10/14/18 10:38 PM  Result Value Ref Range   Glucose-Capillary 80 70 - 99 mg/dL  CBC     Status: Abnormal   Collection Time: 10/15/18  4:41 AM  Result Value Ref Range   WBC 12.0 (H) 4.0 - 10.5 K/uL   RBC 3.82 (L) 4.22 - 5.81 MIL/uL   Hemoglobin 10.7 (L) 13.0 - 17.0 g/dL   HCT 33.1 (L) 39.0 - 52.0 %   MCV 86.6 80.0 - 100.0 fL   MCH 28.0 26.0 - 34.0 pg   MCHC 32.3 30.0 - 36.0 g/dL    RDW 11.3 (L) 11.5 - 15.5 %   Platelets 344 150 - 400 K/uL   nRBC 0.0 0.0 - 0.2 %    Comment: Performed at [facility], [place]. [address], [place], [place] [address]  Glucose, capillary     Status: None   Collection Time: 10/15/18  4:53 AM  Result Value Ref Range   Glucose-Capillary 78 70 - 99 mg/dL  Comprehensive metabolic panel     Status: Abnormal   Collection Time: 10/15/18  9:29 AM  Result Value Ref Range   Sodium 136 135 - 145 mmol/L   Potassium 3.8 3.5 - 5.1 mmol/L   Chloride 103 98 - 111 mmol/L   CO2 25 22 - 32 mmol/L   Glucose, Bld 89 70 - 99 mg/dL   BUN 11 8 - 23 mg/dL   Creatinine, Ser 0.85 0.61 - 1.24 mg/dL   Calcium 10.2 8.9 - 10.3 mg/dL   Total Protein 7.4 6.5 - 8.1 g/dL   Albumin 2.2 (L) 3.5 - 5.0 g/dL   AST 22 15 - 41 U/L   ALT 18 0 - 44 U/L   Alkaline Phosphatase 67 38 - 126 U/L   Total Bilirubin 1.1 0.3 - 1.2 mg/dL   GFR calc non Af Amer >60 >60 mL/min   GFR calc Af Amer >60 >60 mL/min   Anion gap 8 5 - 15    Comment: Performed at [facility] [address] [place], [place] [address]   Dg Chest 2 View  Result Date: 10/14/2018 CLINICAL DATA:  Abnormal chest x-ray. EXAM: CHEST - 2 VIEW COMPARISON:  10/13/2018 FINDINGS: Enlarged cardiac silhouette. Calcific atherosclerotic disease and tortuosity of the aorta. Left lower lobe atelectasis versus peribronchial airspace consolidation. Osseous structures are without acute abnormality. No clavicular fracture is seen. Soft tissues are grossly normal. IMPRESSION: 1. Left lower lobe  atelectasis versus peribronchial airspace consolidation. 2. Enlarged cardiac silhouette. Electronically Signed   By: Fidela Salisbury M.D.   On: 10/14/2018 08:49   Ct Head Wo Contrast  Result Date: 10/15/2018 CLINICAL DATA:  85 year old male with confusion/encephalopathy. Initial encounter. EXAM: CT HEAD WITHOUT CONTRAST TECHNIQUE: Contiguous axial images were obtained from the base of the skull through the vertex without  intravenous contrast. COMPARISON:  09/13/2015 head CT. FINDINGS: Brain: Suspect acute/subacute nonhemorrhagic inferior left cerebellar infarct. New from prior exam although appearing remote is an infarct involving the mid aspect of the left cerebellum. No intracranial hemorrhage. Remote infarct left corona radiata, posterior limb left internal capsule and right lenticular nucleus. Prominent chronic microvascular changes. Global atrophy. Patent cavum septum pellucidum et vergae incidentally noted. No intracranial mass lesion noted on this unenhanced exam. Vascular: Atherosclerotic changes.  No acute hyperdense vessel. Skull: No acute abnormality. Sinuses/Orbits: Post lens replacement. No acute orbital abnormality. Bubbly opacification right maxillary sinus with air-fluid level raises possibly of acute sinusitis. Other: Partial opacification right mastoid air cells and middle ear cavity. No bony destruction. No obstructing lesion of eustachian tube noted. IMPRESSION: 1. Suspect acute/subacute nonhemorrhagic inferior left cerebellar infarct. 2. New from prior exam although appearing remote is an infarct involving the mid aspect of the left cerebellum. 3. No intracranial hemorrhage. 4. Remote infarct left corona radiata, posterior limb left internal capsule and right lenticular nucleus. Prominent chronic microvascular changes. 5. Global atrophy. 6. Bubbly opacification right maxillary sinus with air-fluid level raises possibly of acute sinusitis. 7. Partial opacification right mastoid air cells and middle ear cavity. No obstructing lesion of eustachian tube noted. These results will be called to the ordering clinician or representative by the Radiologist Assistant, and communication documented in the PACS or zVision Dashboard. Electronically Signed   By: Genia Del M.D.   On: 10/15/2018 10:58   Dg Abd 2 Views  Result Date: 10/14/2018 CLINICAL DATA:  Abdominal tenderness EXAM: ABDOMEN - 2 VIEW COMPARISON:  None.  FINDINGS: No disproportionately dilated small bowel loops or significant air-fluid levels. Moderate stool and gas throughout the large bowel. No evidence of pneumatosis or pneumoperitoneum. No radiopaque nephrolithiasis. Mild left basilar scarring or atelectasis. IMPRESSION: 1. Nonobstructive bowel gas pattern. 2. Moderate stool and gas throughout the large bowel, suggesting constipation. Electronically Signed   By: Ilona Sorrel M.D.   On: 10/14/2018 17:02    Assessment: 85 y.o. male with vascular dementia and prior stroke, presenting with an approximately 1 week history of difficulty ambulating.  1. CT head reveals a medium sized inferior left cerebellar hypodense lesion, most likely representing either a subacute or chronic ischemic infarction.  2. Exam reveals no definite lateralized abnormality in the context of patient inability to follow commands due to AMS. Cannot rule out a cerebellar ataxia due to limited exam 3. Stroke Risk Factors - HTN, hypercholesterolemia and prior stroke  Recommendations: 1. MRI brain 2. If MRI is positive for acute or subacute ischemic infarction, obtain remainder of stroke work up 3. Continue ASA.  4. PT consult, OT consult, Speech consult 5. Thiamine and B12 levels. Ammonia level.     @Electronically  signed: Dr. Kerney Elbe  10/15/2018, 11:38 PM

## 2018-10-15 NOTE — Progress Notes (Signed)
Unable to fully assess neuro function due to patient agitation, confusion, and inability to follow commands.

## 2018-10-15 NOTE — Progress Notes (Addendum)
Subjective:  Patrick Rhodes is a 85 y.o. with PMH of CVA, Hypothyroidism, HTN and HLD admit for altered mental status on hospital day 2  Patrick Rhodes was examined and evaluated at bedside this AM with his son present. He was observed resting comfortably in bed.[name] He was alert and oriented x1 to name but not place or date. He complains chiefly of broken clavicle on his right and his prior history of hemorrhoids but unable to specify any specific complaints at this time. His son provides additional history that his temperature was being recorded rectally and may be exacerbating his hemorrhoids. He mentions that he was told in the past that Patrick Rhodes has vascular dementia which may be contributing to his declining mental status but at baseline he is fairly independent and able to ambulate with minimal assistance. He states Patrick Rhodes has a baseline stutter but is alert and oriented and able to answer questions appropriately.  Objective:  Vital signs in last 24 hours: Vitals:   10/14/18 0438 10/14/18 1328 10/14/18 2159 10/15/18 0540  BP: (!) 147/83 (!) 169/87 (!) 188/72 140/63  Pulse: 87 (!) 43 (!) 52 90  Resp:  20 18 18   Temp: 98.1 F (36.7 C) 99.5 F (37.5 C) 98.3 F (36.8 C) 97.7 F (36.5 C)  TempSrc: Axillary Oral Axillary Oral  SpO2: 94% 95% 95%   Weight:       Physical Exam  Constitutional: No distress.  Thin appearing  Cardiovascular: Normal rate, regular rhythm and intact distal pulses. Exam reveals gallop (S3 gallop).  Pulmonary/Chest: Effort normal. He has no wheezes. He has rales (left lower lobe basilar crackles).  Abdominal: Soft. Bowel sounds are normal. There is abdominal tenderness. There is guarding (voluntary guarding).  Musculoskeletal:        General: No deformity (bilateral clavicles appear intact without tenderness or obvious fractures) or edema.     Comments: Soft gloves placed on hands  Neurological: He is alert.  Oriented x1 to name only. Able to follow  some direction. Unable to describe his admission reason, date, location.  Skin: Skin is warm and dry. He is not diaphoretic.  Large lipoma noted on lateral left upper thigh   Assessment/Plan:  Active Problems:   Sepsis ([place])   Pneumonia of left lower lobe due to infectious organism [facility])   Infectious encephalopathy   Hypercalcemia  Patrick Rhodes is a 85 y.o. with PMH of CVA, Hypothyroidism, HTN and HLD admit for altered mental status. Presented with leukocytosis with left shift concerning for sepsis. Blood pressure stable. Blood culture so far negative. Left lower lobe opacity in chest X-ray concerning for pneumonia but no productive sputum or febrile episodes during admission. He has hx of CVA with chronic microvascular subcortical ischemic changes and tiny basal ganglia lacunar infarcts supporting differential of vascular dementia. Will obtain CT head today to evaluate.  Leukocytosis, altered mental status 2/2 CAP Left lower lobe opacity on lateral Chest X-ray. WBC 13.6->9.7->12.0. Most likely hemoconcentration. Blood culture NGTD. Afebrile, BP stable. O2 sat 92% on RA. Low likelihood of MRSA. UA negative for UTI. Lactate 2.4->1.9 - D/c vancomycin - C/w cefepime - Trend CBC - NS 100cc/hr for 5 hours  Hx of CVA and vascular dementia CT head spanning back to 2007 showing ischemic changes with tiny lacunar infarcts. Most likely will be unable to tolerate  - CT head today - Holding home aspirin per PCP request  Hypercalcemia Presented w/ calcium calculated for hypoalbuminemia at 11.8->11.7 this am. TSH wnl - F/u  PTH  Constipation Some voluntary abdominal guarding but no obvious tenderness on exam. KUB showing constipation w/o obstruction - C/w Miralax 17g BID, Senokot 1 tab BID  Hx of Hypothyroidism TSH 2.785 - C/w home med: levovthyroxine 12mcg daily  HTN - C/w home med: losartan 100mg  daily  DVT prophx: Lovenox Diet: Soft diet Bowel: Senokot, Miralax Code:  Full  Dispo: Anticipated discharge in approximately 1-2 day(s).   Patrick Anis, MD 10/15/2018, 6:57 AM Pager: [phone]

## 2018-10-15 NOTE — Progress Notes (Signed)
Patient confused and irritable this morning. Refusing medications and breakfast. Will keep offering liquids throughout the day. 

## 2018-10-15 NOTE — Progress Notes (Signed)
Date: 10/15/2018  Patient name: Patrick Rhodes  Medical record number: [id number]  Date of birth: Aug 17, 1930   I have seen and evaluated this patient and I have discussed the plan of care with the house staff. Please see their note for complete details. I concur with their findings with the following additions/corrections:   85 year old man with history of CVA, hypothyroidism, HTN, admitted from home for confusion and weakness.  We spoke with his son at the bedside today to obtain a more full history.  He reports that his dad is pretty functional at baseline, needing some help with bathing from his aid and going to pace during the day, but otherwise independent in his activities.  Over the past week or so, he has seemed weaker and has had difficulty walking on his own.  At some point, he may have even been weaker on one side than the other.  During this time, he has had some sinus congestion and decreased appetite.  His speech is always stuttering, but has been more confused in the past week.  Today on exam, it is difficult to get much history from Mr. Makara.  His speech is dysarthric, stuttering, and difficult to follow.  He is not able to fully cooperate with a neurologic exam.  Extraocular eye movements appear intact and without nystagmus.  Significant test results for the past 24 hours include: PTH 53 Calcium 10.2 Albumin 2.2 CT head noncontrast shows acute or subacute left cerebellar stroke without hemorrhage  In summary, this is an 85 year old man with history of prior stroke, hypothyroidism, and hypertension, who was admitted for progressive weakness and encephalopathy of unclear origin.  On initial work-up, he had a leukocytosis and a clear opacity in the left lower lobe on lateral chest film, raising the suspicion for CAP as the cause of his encephalopathy.  Given his lack of improvement thus far and the report from his son of some possible weakness on one side of his body, we  obtained a CT head today that showed a left cerebellar stroke that is new since prior imaging, but unclear exactly how old.  It is not clear if this is the cause of his symptoms or not.  #Acute or subacute left cerebellar stroke: We have consulted neurology and will try to obtain an MRI, although he may not be able to stay still long enough for a full MRI brain.  Given his age and functional status, our management of his stroke will focus on risk factor management. -MRI brain if possible - Telemetry to monitor for atrial fibrillation - Permissive hypertension for now, will try to ensure BP is well controlled after discharge  #Community-acquired pneumonia versus possible atelectasis: Unclear at this point if he truly has community-acquired pneumonia, but we will continue treatment with ceftriaxone given his leukocytosis and the lack of clarity as to whether the stroke caused his symptoms. -Continue ceftriaxone, no need for broader coverage at this time -Ceftriaxone should also provide adequate coverage of his sinusitis noted on his CT head  #Hypercalcemia: On review of his labs, this appears to be chronic, as it was elevated back in 2017.  PTH is inappropriately normal, consistent with primary hyperparathyroidism.  Familial hypocalciuric hypercalcemia has not been ruled out, but is less likely.  At this point, it is not clear he is having any true symptoms from his hypercalcemia, although he does have some constipation.  No kidney stones noted on abdominal plain films, and no vertebral fractures noted.  There is  no indication for medical therapy and he is certainly not a surgical candidate at this time.  We will continue to monitor for now and avoid dehydration and thiazide diuretics, if he develops symptoms we will consider additional medical management.  [name], M.D., Ph.D. 10/15/2018, 3:09 PM

## 2018-10-16 ENCOUNTER — Inpatient Hospital Stay (HOSPITAL_COMMUNITY): Payer: Medicare (Managed Care)

## 2018-10-16 ENCOUNTER — Encounter (HOSPITAL_COMMUNITY): Payer: Self-pay

## 2018-10-16 DIAGNOSIS — F0390 Unspecified dementia without behavioral disturbance: Secondary | ICD-10-CM

## 2018-10-16 DIAGNOSIS — J181 Lobar pneumonia, unspecified organism: Secondary | ICD-10-CM

## 2018-10-16 DIAGNOSIS — I10 Essential (primary) hypertension: Secondary | ICD-10-CM

## 2018-10-16 DIAGNOSIS — I639 Cerebral infarction, unspecified: Secondary | ICD-10-CM

## 2018-10-16 DIAGNOSIS — A419 Sepsis, unspecified organism: Secondary | ICD-10-CM

## 2018-10-16 DIAGNOSIS — E785 Hyperlipidemia, unspecified: Secondary | ICD-10-CM

## 2018-10-16 DIAGNOSIS — R011 Cardiac murmur, unspecified: Secondary | ICD-10-CM

## 2018-10-16 LAB — LIPID PANEL
Cholesterol: 140 mg/dL (ref 0–200)
HDL: 36 mg/dL — ABNORMAL LOW (ref >40)
LDL Cholesterol: 92 mg/dL (ref 0–99)
Total CHOL/HDL Ratio: 3.9 RATIO
Triglycerides: 59 mg/dL (ref <150)
VLDL: 12 mg/dL (ref 0–40)

## 2018-10-16 LAB — COMPREHENSIVE METABOLIC PANEL
ALT: 16 U/L (ref 0–44)
AST: 21 U/L (ref 15–41)
Albumin: 2.2 g/dL — ABNORMAL LOW (ref 3.5–5.0)
Alkaline Phosphatase: 65 U/L (ref 38–126)
Anion gap: 16 — ABNORMAL HIGH (ref 5–15)
BUN: 17 mg/dL (ref 8–23)
CO2: 19 mmol/L — ABNORMAL LOW (ref 22–32)
Calcium: 10.1 mg/dL (ref 8.9–10.3)
Chloride: 102 mmol/L (ref 98–111)
Creatinine, Ser: 0.89 mg/dL (ref 0.61–1.24)
GFR calc Af Amer: >60 mL/min (ref >60)
GFR calc non Af Amer: >60 mL/min (ref >60)
Glucose, Bld: 59 mg/dL — ABNORMAL LOW (ref 70–99)
Potassium: 3.9 mmol/L (ref 3.5–5.1)
Sodium: 137 mmol/L (ref 135–145)
Total Bilirubin: 1.1 mg/dL (ref 0.3–1.2)
Total Protein: 8.1 g/dL (ref 6.5–8.1)

## 2018-10-16 LAB — CBC
HCT: 32.3 % — ABNORMAL LOW (ref 39.0–52.0)
Hemoglobin: 10.3 g/dL — ABNORMAL LOW (ref 13.0–17.0)
MCH: 27.9 pg (ref 26.0–34.0)
MCHC: 31.9 g/dL (ref 30.0–36.0)
MCV: 87.5 fL (ref 80.0–100.0)
Platelets: 346 10*3/uL (ref 150–400)
RBC: 3.69 MIL/uL — ABNORMAL LOW (ref 4.22–5.81)
RDW: 11.4 % — ABNORMAL LOW (ref 11.5–15.5)
WBC: 8.4 10*3/uL (ref 4.0–10.5)
nRBC: 0 % (ref 0.0–0.2)

## 2018-10-16 LAB — AMMONIA: Ammonia: 32 umol/L (ref 9–35)

## 2018-10-16 LAB — VITAMIN B12: Vitamin B-12: 1091 pg/mL — ABNORMAL HIGH (ref 180–914)

## 2018-10-16 MED ORDER — ATORVASTATIN CALCIUM 10 MG PO TABS
20.0000 mg | ORAL_TABLET | Freq: Every day | ORAL | Status: DC
  Administered 2018-10-16 – 2018-10-18 (×3): 20 mg via ORAL
  Filled 2018-10-16 (×2): qty 2

## 2018-10-16 MED ORDER — SODIUM CHLORIDE 0.9 % IV SOLN
INTRAVENOUS | Status: AC
  Administered 2018-10-16: 08:00:00 via INTRAVENOUS

## 2018-10-16 MED ORDER — IOPAMIDOL (ISOVUE-370) INJECTION 76%
INTRAVENOUS | Status: AC
  Administered 2018-10-16: 22:00:00
  Filled 2018-10-16: qty 100

## 2018-10-16 MED ORDER — LORAZEPAM 2 MG/ML IJ SOLN
INTRAMUSCULAR | Status: AC
  Filled 2018-10-16: qty 1

## 2018-10-16 MED ORDER — ATORVASTATIN CALCIUM 80 MG PO TABS: 80.0000 mg | ORAL_TABLET | Freq: Every day | ORAL | Status: DC

## 2018-10-16 MED ORDER — GADOBUTROL 1 MMOL/ML IV SOLN
6.0000 mL | Freq: Once | INTRAVENOUS | Status: AC | PRN
  Administered 2018-10-16: 6 mL via INTRAVENOUS

## 2018-10-16 MED ORDER — VITAMIN B-1 100 MG PO TABS
100.0000 mg | ORAL_TABLET | Freq: Every day | ORAL | Status: DC
  Administered 2018-10-17 – 2018-10-19 (×2): 100 mg via ORAL
  Filled 2018-10-16 (×2): qty 1

## 2018-10-16 MED ORDER — IOPAMIDOL (ISOVUE-370) INJECTION 76%
75.0000 mL | Freq: Once | INTRAVENOUS | Status: AC | PRN
  Administered 2018-10-16: 75 mL via INTRAVENOUS

## 2018-10-16 MED ORDER — LORAZEPAM 2 MG/ML IJ SOLN
0.5000 mg | Freq: Once | INTRAMUSCULAR | Status: AC | PRN
  Administered 2018-10-16: 0.5 mg via INTRAVENOUS

## 2018-10-16 MED ORDER — THIAMINE HCL 100 MG/ML IJ SOLN
100.0000 mg | Freq: Every day | INTRAMUSCULAR | Status: DC
  Administered 2018-10-16 – 2018-10-18 (×2): 100 mg via INTRAVENOUS
  Filled 2018-10-16 (×2): qty 2

## 2018-10-16 NOTE — Progress Notes (Signed)
Carotid duplex exam completed. Please see preliminary notes on CV PROC under chart review. Shacoya Burkhammer H Sie Formisano(RDMS RVT) 10/16/18 4:57 PM

## 2018-10-16 NOTE — Progress Notes (Signed)
  Speech Language Pathology Treatment: Dysphagia  Patient Details Name: Patrick Rhodes MRN: [id number] DOB: 1929-12-05 Today's Date: 10/16/2018 Time: 0850-0901 SLP Time Calculation (min) (ACUTE ONLY): 11 min  Assessment / Plan / Recommendation Clinical Impression  Pt consumed more POs today than on previous date, although the amount is still small. He has no overt signs of aspiration but does have frequent eructation. He seems to have improved oral acceptance and less resistance to feeder when given Mod verbal cues to attend to task and anticipate what is coming. Recommend continuing current diet with full supervision for both safety and to facilitate the amount of intake.   HPI HPI: Pt is an 85 y.o. admitted with AMS. CT is suggestive of an acute/subacute L cerebellar infarct. MRI pending. PMH of CVA, Hypothyroidism, HTN and HLD      SLP Plan  Continue with current plan of care       Recommendations  Diet recommendations: Dysphagia 1 (puree);Thin liquid Liquids provided via: Straw Medication Administration: Crushed with puree Supervision: Staff to assist with self feeding;Full supervision/cueing for compensatory strategies Compensations: Minimize environmental distractions;Slow rate;Small sips/bites Postural Changes and/or Swallow Maneuvers: Seated upright 90 degrees;Upright 30-60 min after meal                Oral Care Recommendations: Oral care BID Follow up Recommendations: Skilled Nursing facility SLP Visit Diagnosis: Dysphagia, unspecified (R13.10) Plan: Continue with current plan of care       GO                Venita Sheffield Satcha Storlie 10/16/2018, 9:53 AM  Pollyann Glen, M.A. [place] Acute Environmental education officer [phone] Office [phone]

## 2018-10-16 NOTE — Progress Notes (Signed)
Lower extremity venous duplex completed. Refer to "CV Proc" under chart review to view preliminary results.  10/16/2018 11:54 AM Maudry Mayhew, MHA, RVT, RDCS, RDMS

## 2018-10-16 NOTE — Progress Notes (Signed)
Pt is alert to name and age but is confused about time and situation.  It is very difficult to do nuero checks on him because he is non compliant, not taking direction and fighting whenever you try and do something with him.  He is otherwise stable.

## 2018-10-16 NOTE — Progress Notes (Addendum)
STROKE TEAM PROGRESS NOTE   INTERVAL HISTORY His RN is at the bedside.  Pt has been working with PT/OT, is sitting in chair currently.  As per RN, patient seems more awake alert in the afternoon day in the morning and yesterday.  Patient awake, alert, however still lethargic, not quite following commands with severe dysarthria.  Moving all extremities spontaneously.  Vitals:   10/15/18 2230 10/16/18 0539 10/16/18 0539 10/16/18 1434  BP: (!) 153/88 (!) 134/97  (!) 163/96  Pulse: 93 (!) 52  83  Resp:  19    Temp: 98.3 F (36.8 C)  98.9 F (37.2 C) 97.7 F (36.5 C)  TempSrc: Oral  Oral   SpO2: 100% 100%  97%  Weight:        CBC:  Recent Labs  Lab 10/13/18 1406  10/15/18 0441 10/16/18 0610  WBC 13.6*   < > 12.0* 8.4  NEUTROABS 11.4*  --   --   --   HGB 10.9*   < > 10.7* 10.3*  HCT 34.9*   < > 33.1* 32.3*  MCV 89.9   < > 86.6 87.5  PLT 314   < > 344 346   < > = values in this interval not displayed.    Basic Metabolic Panel:  Recent Labs  Lab 10/15/18 0929 10/16/18 0610  NA 136 137  K 3.8 3.9  CL 103 102  CO2 25 19*  GLUCOSE 89 59*  BUN 11 17  CREATININE 0.85 0.89  CALCIUM 10.2 10.1   Lipid Panel:     Component Value Date/Time   CHOL 140 10/16/2018 0610   TRIG 59 10/16/2018 0610   HDL 36 (L) 10/16/2018 0610   CHOLHDL 3.9 10/16/2018 0610   VLDL 12 10/16/2018 0610   LDLCALC 92 10/16/2018 0610   HgbA1c: No results found for: HGBA1C Urine Drug Screen: No results found for: LABOPIA, COCAINSCRNUR, LABBENZ, AMPHETMU, THCU, LABBARB  Alcohol Level No results found for: ETH  IMAGING Ct Head Wo Contrast  Result Date: 10/15/2018 CLINICAL DATA:  85 year old male with confusion/encephalopathy. Initial encounter. EXAM: CT HEAD WITHOUT CONTRAST TECHNIQUE: Contiguous axial images were obtained from the base of the skull through the vertex without intravenous contrast. COMPARISON:  09/13/2015 head CT. FINDINGS: Brain: Suspect acute/subacute nonhemorrhagic inferior left  cerebellar infarct. New from prior exam although appearing remote is an infarct involving the mid aspect of the left cerebellum. No intracranial hemorrhage. Remote infarct left corona radiata, posterior limb left internal capsule and right lenticular nucleus. Prominent chronic microvascular changes. Global atrophy. Patent cavum septum pellucidum et vergae incidentally noted. No intracranial mass lesion noted on this unenhanced exam. Vascular: Atherosclerotic changes.  No acute hyperdense vessel. Skull: No acute abnormality. Sinuses/Orbits: Post lens replacement. No acute orbital abnormality. Bubbly opacification right maxillary sinus with air-fluid level raises possibly of acute sinusitis. Other: Partial opacification right mastoid air cells and middle ear cavity. No bony destruction. No obstructing lesion of eustachian tube noted. IMPRESSION: 1. Suspect acute/subacute nonhemorrhagic inferior left cerebellar infarct. 2. New from prior exam although appearing remote is an infarct involving the mid aspect of the left cerebellum. 3. No intracranial hemorrhage. 4. Remote infarct left corona radiata, posterior limb left internal capsule and right lenticular nucleus. Prominent chronic microvascular changes. 5. Global atrophy. 6. Bubbly opacification right maxillary sinus with air-fluid level raises possibly of acute sinusitis. 7. Partial opacification right mastoid air cells and middle ear cavity. No obstructing lesion of eustachian tube noted. These results will be called to the  ordering clinician or representative by the Radiologist Assistant, and communication documented in the PACS or zVision Dashboard. Electronically Signed   By: Genia Del M.D.   On: 10/15/2018 10:58   Patrick Jeri Cos PZ Contrast  Result Date: 10/16/2018 CLINICAL DATA:  Altered mental status EXAM: MRI HEAD WITHOUT AND WITH CONTRAST TECHNIQUE: Multiplanar, multiecho pulse sequences of the brain and surrounding structures were obtained without and  with intravenous contrast. CONTRAST:  6 mL Gadavist COMPARISON:  Head CT 10/15/2018 FINDINGS: BRAIN: There is acute ischemia within the left cerebellar hemisphere, within the PICA distribution. No supratentorial ischemia. Incidentally noted cavum septum pellucidum et vergae. No midline shift or other mass effect. Diffuse confluent hyperintense T2-weighted signal within the periventricular, deep and juxtacortical white matter, most commonly due to chronic ischemic microangiopathy. There are multiple old lacunar infarcts of the basal ganglia and centrum semiovale. There is a left frontal convexity meningioma measuring 18 x 11 mm. No underlying edema. Generalized atrophy without lobar predilection. Susceptibility-sensitive sequences show no chronic microhemorrhage or superficial siderosis. VASCULAR: Abnormal left vertebral artery flow void. The other major flow voids at the skull base are normal. SKULL AND UPPER CERVICAL SPINE: Calvarial bone marrow signal is normal. There is no skull base mass. Visualized upper cervical spine and soft tissues are normal. SINUSES/ORBITS: Right mastoid effusion. Bubbly secretions in the right maxillary sinus. There are bilateral lens replacements. IMPRESSION: 1. Nonhemorrhagic acute infarct of the left cerebellum PICA territory. Mild edema without mass effect. 2. Abnormal left vertebral artery flow void may indicate stenosis/reduced flow or dissection in the context of left PICA territory infarct. MRA of the neck with without contrast might be helpful. 3. Advanced chronic microvascular ischemia and generalized atrophy. 4. Superior left frontal convexity meningioma without abnormality of the underlying brain parenchyma. Electronically Signed   By: Ulyses Jarred M.D.   On: 10/16/2018 15:05   Vas [place] Lower Extremity Venous (dvt)  Result Date: 10/16/2018  Lower Venous Study Indications: Swelling.  Limitations: Patient uncooperative and combative, unable to lie in proper position for  exam. Performing Technologist: Maudry Mayhew MHA, RDMS, RVT, RDCS  Examination Guidelines: A complete evaluation includes B-mode imaging, spectral Doppler, color Doppler, and power Doppler as needed of all accessible portions of each vessel. Bilateral testing is considered an integral part of a complete examination. Limited examinations for reoccurring indications may be performed as noted.  Right Venous Findings: +---------+---------------+---------+-----------+----------+--------------+          CompressibilityPhasicitySpontaneityPropertiesSummary        +---------+---------------+---------+-----------+----------+--------------+ FV Prox  Full                                                        +---------+---------------+---------+-----------+----------+--------------+ FV Mid   Full                                                        +---------+---------------+---------+-----------+----------+--------------+ FV DistalFull                                                        +---------+---------------+---------+-----------+----------+--------------+  PFV                                                   Not visualized +---------+---------------+---------+-----------+----------+--------------+ POP                                                   Not visualized +---------+---------------+---------+-----------+----------+--------------+ PTV      Full                                                        +---------+---------------+---------+-----------+----------+--------------+ PERO     Full           Yes      Yes                                 +---------+---------------+---------+-----------+----------+--------------+  Left Venous Findings: +---------+---------------+---------+-----------+----------+--------------+          CompressibilityPhasicitySpontaneityPropertiesSummary         +---------+---------------+---------+-----------+----------+--------------+ CFV      Full           Yes      Yes                                 +---------+---------------+---------+-----------+----------+--------------+ SFJ      Full                                                        +---------+---------------+---------+-----------+----------+--------------+ FV Prox  Full                                                        +---------+---------------+---------+-----------+----------+--------------+ FV Mid                           Yes                                 +---------+---------------+---------+-----------+----------+--------------+ FV Distal                                             Not visualized +---------+---------------+---------+-----------+----------+--------------+ POP                                                   Not visualized +---------+---------------+---------+-----------+----------+--------------+ PTV  Not visualized +---------+---------------+---------+-----------+----------+--------------+ PERO                                                  Not visualized +---------+---------------+---------+-----------+----------+--------------+    Summary: Right: There is no evidence of deep vein thrombosis in the lower extremity. However, portions of this examination were limited- see technologist comments above. No cystic structure found in the popliteal fossa. Left: There is no evidence of deep vein thrombosis in the lower extremity. However, portions of this examination were limited- see technologist comments above. No cystic structure found in the popliteal fossa.  *See table(s) above for measurements and observations.    Preliminary     PHYSICAL EXAM  Temp:  [97.7 F (36.5 C)-98.9 F (37.2 C)] 97.7 F (36.5 C) (02/25 1434) Pulse Rate:  [52-93] 83 (02/25 1434) Resp:  [18-19]  19 (02/25 0539) BP: (134-184)/(88-97) 163/96 (02/25 1434) SpO2:  [97 %-100 %] 97 % (02/25 1434)  General - Well nourished, well developed, lethargic.  Ophthalmologic - fundi not visualized due to noncooperation.  Cardiovascular - irregular heart rhythm, bigeminy on telemetry.  Neuro - awake, alert, eyes open, however still lethargic and not quite following commands.  He is orientated to his name and age, however not orientated to place, time or situation.  Paucity of speech, severe dysarthria, follow very limited simple commands of eye close and opening, but not following other simple commands.  PERRL, able to attend both sides, however slow saccade, blinking to visual threat bilaterally.  Right nasolabial fold mild flattening, tongue midline inside mouth.  Bilateral upper extremities 4/5, bilateral lower extremity proximal 2/5, distal 3/5.  DTR 1+, no Babinski.  Bilateral upper extremity finger-to-nose slow but no ataxia.  Sensation not corporative, gait not tested.   ASSESSMENT/PLAN Patrick. ESMOND Rhodes is a 85 y.o. male with history of hypothyroidism, HTN, vascular dementia and prior stroke presenting with increasing fatigue, lethargy, decreased appetite, generalized weakness and difficulty ambulating.  Stroke:  left cerebellar infarct, etiology unclear  Resultant lethargy and disorientation  CT head suspect L cerebellar infarct. Old L cerebellar infarct (new from previous CT). Old L CR, L PLIC, R LN infarct Small vessel disease.   MRI  Acute L cerebellar PICA infarct. Mild edema. Abnormal L VA flow void.   CTA head & neck pending   Carotid doppler pending   2D Echo  pending   LE venous Doppler negative for DVT  LDL 92  HgbA1c pending   Lovenox 40 mg sq daily for VTE prophylaxis  aspirin 81 mg daily prior to admission, now on aspirin 325 mg daily.   Therapy recommendations: SNF  Disposition:  pending  (ambulated independently, needs help w/ bathing, [facility])  Hypertension  Home meds: cozaar 100  Stable . Permissive hypertension (OK if < 220/120) but gradually normalize in 5-7 days . Long-term BP goal normotensive  Hyperlipidemia  Home meds:  No statin  LDL 92, goal < 70  Now on Lipitor 20  Continue statin at discharge  Meningioma, incidental finding  My brain showed superior L frontal convexity meningioma.   No abnormality of underlying brain parenchyma  No intervention needed at this time  Continue monitoring  Other Stroke Risk Factors  Advanced age  Hx stroke/TIA - on imaging  Other Active Problems  Hypothyroidism  Leukocytosis WBC 12.0->8.4  Vascular dementia -  baseline, he ambulates on his own but needs some help with bathing and goes to [facility] 5 days per week. He stutters at baseline.  ? Left lower lobe atelectasis versus peribronchial airspace consolidation - afebrile - on rocephin  Hospital day # 3   Rosalin Hawking, MD PhD Stroke Neurology 10/16/2018 4:02 PM    To contact Stroke Continuity provider, please refer to [email or web address] After hours, contact General Neurology

## 2018-10-16 NOTE — Evaluation (Signed)
Occupational Therapy Evaluation Patient Details Name: Patrick Rhodes MRN: [id number] DOB: [birth year] Today's Date: 10/16/2018    History of Present Illness [name] is an 85 year old man with a history of prior cerebrovascular accident, hypothyroidism, and hypertension who lives with his son and [facility].  He generally is ambulatory and independent but over the last week his son noted increasing fatigue and weakness Confusion, urinary incontinence, decreased oral intake. Found to have Nonhemorrhagic acute infarct of the left cerebellum PICA territory, sepsis, PNA   Clinical Impression   This 85 yo male admitted with above presents to acute OT with decreased mobility, decreased balance, intermittent command following with increased time all affecting his ability to perform and help with basic ADLs. He will benefit from acute OT with follow up OT at SNF.    Follow Up Recommendations  SNF;Supervision/Assistance - 24 hour    Equipment Recommendations  Other (comment)(TBD at next venue)       Precautions / Restrictions Precautions Precautions: Fall Restrictions Weight Bearing Restrictions: No      Mobility Bed Mobility Overal bed mobility: Needs Assistance Bed Mobility: Supine to Sit     Supine to sit: Mod assist        Transfers Overall transfer level: Needs assistance Equipment used: 1 person hand held assist   Sit to Stand: Mod assist              Balance Overall balance assessment: Needs assistance Sitting-balance support: No upper extremity supported;Feet supported Sitting balance-Leahy Scale: Poor   Postural control: Posterior lean;Left lateral lean Standing balance support: Bilateral upper extremity supported Standing balance-Leahy Scale: Zero                             ADL either performed or assessed with clinical judgement   ADL Overall ADL's : Needs assistance/impaired Eating/Feeding: Total  assistance Eating/Feeding Details (indicate cue type and reason): supported sitting Grooming: Total assistance Grooming Details (indicate cue type and reason): supported sitting Upper Body Bathing: Total assistance Upper Body Bathing Details (indicate cue type and reason): supported sitting Lower Body Bathing: Total assistance Lower Body Bathing Details (indicate cue type and reason): Mod A sit<>stand with increased time to achieve almost fully upright Upper Body Dressing : Total assistance Upper Body Dressing Details (indicate cue type and reason): supported sitting Lower Body Dressing: Total assistance Lower Body Dressing Details (indicate cue type and reason): Mod A sit<>stand with increased time to achieve almost fully upright Toilet Transfer: Moderate assistance;Stand-pivot Toilet Transfer Details (indicate cue type and reason): bed>recliner next to bed Toileting- Clothing Manipulation and Hygiene: Total assistance Toileting - Clothing Manipulation Details (indicate cue type and reason): Mod A sit<>stand with increased time to achieve almost fully upright             Vision Baseline Vision/History: (pt unable to tell us)              Pertinent Vitals/Pain Pain Assessment: No/denies pain     Hand Dominance Right   Extremity/Trunk Assessment Upper Extremity Assessment Upper Extremity Assessment: Generalized weakness           Communication Communication Communication: Other (comment)(history of stuttering )   Cognition Arousal/Alertness: Lethargic;Suspect due to medications(ativan prior to MRI) Behavior During Therapy: Flat affect Overall Cognitive Status: History of cognitive impairments - at baseline  General Comments: no family here to determine if this is worse than baseline. Pt was oriented to name, DOB , and [place] expects to be discharged to:: Skilled  nursing facility                                 Additional Comments: Per chart pt was living at home and fairly independent pta               OT Problem List: Decreased strength;Decreased range of motion;Impaired balance (sitting and/or standing);Decreased activity tolerance;Decreased safety awareness;Decreased coordination;Decreased cognition;Impaired UE functional use      OT Treatment/Interventions: Self-care/ADL training;Balance training;DME and/or AE instruction;Patient/family education    OT Goals(Current goals can be found in the care plan section) Acute Rehab OT Goals Patient Stated Goal: to get warm and get OJ OT Goal Formulation: With patient Time For Goal Achievement: 10/30/18 Potential to Achieve Goals: Fair  OT Frequency: Min 2X/week           Co-evaluation PT/OT/SLP Co-Evaluation/Treatment: Yes Reason for Co-Treatment: For patient/therapist safety;To address functional/ADL transfers PT goals addressed during session: Mobility/safety with mobility;Balance;Strengthening/ROM OT goals addressed during session: ADL's and self-care;Strengthening/ROM      AM-PAC OT "6 Clicks" Daily Activity     Outcome Measure Help from another person eating meals?: Total Help from another person taking care of personal grooming?: Total Help from another person toileting, which includes using toliet, bedpan, or urinal?: Total Help from another person bathing (including washing, rinsing, drying)?: Total Help from another person to put on and taking off regular upper body clothing?: Total Help from another person to put on and taking off regular lower body clothing?: Total 6 Click Score: 6   End of Session Nurse Communication: Mobility status  Activity Tolerance: Patient limited by lethargy Patient left: in chair;with call bell/phone within reach;with chair alarm set  OT Visit Diagnosis: Unsteadiness on feet (R26.81);Other abnormalities of gait and mobility  (R26.89);Muscle weakness (generalized) (M62.81)                Time: 9604-5409 OT Time Calculation (min): 24 min Charges:  OT General Charges $OT Visit: 1 Visit OT Evaluation $OT Eval Moderate Complexity: 1 Mod [place], OTR/L Acute [employer] Pager [phone] Office [phone]     Almon Register 10/16/2018, 3:49 PM

## 2018-10-16 NOTE — Progress Notes (Addendum)
Had to override pyxis and pull out 1X dose of ativan  needed for patient's MRI (pharmacy had not yet verified). Greta RN witnessed.

## 2018-10-16 NOTE — Progress Notes (Signed)
  Date: 10/16/2018  Patient name: Patrick Rhodes  Medical record number: [id number]  Date of birth: [birth year]   I have seen and evaluated this patient and I have discussed the plan of care with the house staff. Please see their note for complete details. I concur with their findings with the following additions/corrections:   Still confused today, difficult to engage with history or exam.  His main focus remains his clavicle pain, which has been evaluated and he has no sign of fracture.  Unable to obtain MRI brain overnight due to agitation.  Discussed with neurology team, we will try again to obtain MRI with a dose of benzodiazepine, although benzos can be problematic in elderly delirious patients.  We are continuing a 5-day course of ceftriaxone for possible left lower lobe pneumonia on presentation, although given that he has not had a fever or cough since admission, more likely this represented atelectasis.  There is concern that his intake is limited, unfortunately no intake has been recorded.  Only 450 cc of urine output recorded yesterday.  If this is truly all of his U OP, he may need IV fluids to make up for his lack of oral intake.  Would prefer to use intermittent boluses of IV fluids rather than maintenance fluids as any attachments to them will increase his agitation and delirium.  [name], M.D., Ph.D. 10/16/2018, 2:19 PM

## 2018-10-16 NOTE — Progress Notes (Signed)
Prelim MRI read positive for right cerebellar stroke.  Recs: -Telemetry monitoring -Allow for permissive hypertension for the first 24-48h - only treat PRN if SBP >220 mmHg. Blood pressures can be gradually normalized to SBP<140 upon discharge. -CT Angiogram of Head and neck -Echocardiogram -HgbA1c, fasting lipid panel -Frequent neuro checks -Prophylactic therapy-Antiplatelet med: Aspirin - dose 325mg  PO or 300mg  PR -Atorvastatin 80 mg PO daily -Risk factor modification -PT consult, OT consult, Speech consult  Stroke team will follow with you  -- Amie Portland, MD [facility] Pager: [phone] If 7pm to 7am, please call on call as listed on AMION.

## 2018-10-16 NOTE — Progress Notes (Addendum)
   Subjective:  Patrick Rhodes is a 85 y.o. with PMH of CVA, hypothyroidism, HTN admit for altered mental status on hospital day 3  PatrickRhodes was examined and evaluated at bedside this AM. He was observed laying comfortably in bed. He continues to endorse significant concern about his broken clavicle despite not having any significant physical exam findings. He is able to state his name but not oriented to date or location. He is unable to answer review of systems questions.  Objective:  Vital signs in last 24 hours: Vitals:   10/15/18 1806 10/15/18 2230 10/16/18 0539 10/16/18 0539  BP: (!) 184/91 (!) 153/88 (!) 134/97   Pulse: (!) 57 93 (!) 52   Resp: 18  19   Temp: 98.2 F (36.8 C) 98.3 F (36.8 C)  98.9 F (37.2 C)  TempSrc: Axillary Oral  Oral  SpO2: 100% 100% 100%   Weight:       Physical Exam  Constitutional:  Thin, cachetic appearing  Cardiovascular: Normal rate, regular rhythm and intact distal pulses.  Murmur (left sternal border systolic murmur) heard. Pulmonary/Chest: Effort normal and breath sounds normal. He has no wheezes. He has no rales.  Abdominal: Soft. Bowel sounds are normal. There is abdominal tenderness (diffuse tenderness to palpation). There is guarding (voluntary guarding).  Musculoskeletal: Normal range of motion.        General: Deformity (left calf greater in size than right calf) present. No edema.  Neurological: He is alert.  Oriented x1 to name. Difficult to perform full neuro exam due to inability to follow directions. Able to keep arms and legs off the bed    Assessment/Plan:  Principal Problem:   Cerebellar stroke (HCC) Active Problems:   Sepsis ([place])   Pneumonia of left lower lobe due to infectious organism [facility])   Infectious encephalopathy   Hypercalcemia  Patrick Rhodes is a 85 y.o. with PMH of CVA, Hypothyroidism, HTN and HLD admit for altered mental status. His CT yesterday showed medium sized inferior left cerebellar lesion  of unclear age. MRI was attempted yesterday but patient unable to tolerate due to agitation. Neuro recommending continuing aspirin and trying to get MRI with sedation. His leukocytosis associated with his sinusitis & LLL pneumonia are improving after de-escalating abx to ceftriaxone.  Leukocytosis, altered mental status 2/2 CAP WBC 12.0->8.4 Not back to baseline mentation. BP 134/97. Afebrile overnight. Vanc/Cefepime changed to ceftriaxone yesterday. - C/w ceftriaxone (day 3/5)  - Trend CBC - NS 100cc/hr for 12 hours  Left cerebellar infarct of indeterminate age  CT head yesterday showing acute/subacute nonhemorrhagic inferior left cerebellar infarct. Started on aspirin yesterday. Unclear neuro exam. - Appreciate neuro recs: Need MRI to determine age of infarct. - PT/OT/Speech eval and treat - MRI attempt again today - C/w Asa 325mg  daily   Hypercalcemia Continuing to endorse calculated calcium at 11.7. TSH wnl. PTH wnl at 53 despite high calcium suggesting primary hyperparathyroidism. Possibly [place]. Will c/w monitor. - Maintenance fluid at NS 100cc/hr  Constipation Continuing to endorse guarding but no acute abdomen. No bowel movement since admission. - Can do suppository if no bowel movement today - C/w Miralax 17g BID, Senokot 1 tab BID  DVT prophx: Lovenox Diet: Soft diet Bowel: Senokot, Miralax Code: Full  Dispo: Anticipated discharge in approximately 2-3 day(s).   Patrick Anis, MD 10/16/2018, 7:48 AM Pager: [phone]

## 2018-10-16 NOTE — Evaluation (Addendum)
Physical Therapy Evaluation Patient Details Name: Patrick Rhodes MRN: [id number] DOB: 12-Feb-1930 Today's Date: 10/16/2018   History of Present Illness  [name] is an 85 year old man with a history of prior cerebrovascular accident, hypothyroidism, and hypertension who lives with his son and [facility].  He generally is ambulatory and independent but over the last week his son noted increasing fatigue and weakness Confusion, urinary incontinence, decreased oral intake. Found to have Nonhemorrhagic acute infarct of the left cerebellum PICA territory, sepsis, PNA  Clinical Impression  Pt admitted with/for increasing fatigue and weakness and found to have an acute left cerebellar infarct.  Pt needing light moderate assist for basic mobility and gait.[name]  Pt currently limited functionally due to the problems listed. ( See problems list.)   Pt will benefit from PT to maximize function and safety in order to get ready for next venue listed below.     Follow Up Recommendations SNF;Supervision/Assistance - 24 hour    Equipment Recommendations  Other (comment)(TBA)    Recommendations for Other Services       Precautions / Restrictions Precautions Precautions: Fall Restrictions Weight Bearing Restrictions: No      Mobility  Bed Mobility Overal bed mobility: Needs Assistance Bed Mobility: Supine to Sit     Supine to sit: Mod assist        Transfers Overall transfer level: Needs assistance Equipment used: 1 person hand held assist Transfers: Sit to/from Stand Sit to Stand: Mod assist            Ambulation/Gait Ambulation/Gait assistance: Mod assist;+2 physical assistance Gait Distance (Feet): 10 Feet Assistive device: 2 person hand held assist Gait Pattern/deviations: Step-through pattern   Gait velocity interpretation: <1.31 ft/sec, indicative of household ambulator General Gait Details: gait mildly unsteady and guarded.  Stairs            Wheelchair Mobility    Modified Rankin (Stroke Patients Only) Modified Rankin (Stroke Patients Only) Pre-Morbid Rankin Score: No symptoms Modified Rankin: Moderately severe disability     Balance Overall balance assessment: Needs assistance Sitting-balance support: No upper extremity supported;Feet supported Sitting balance-Leahy Scale: Poor   Postural control: Posterior lean;Left lateral lean Standing balance support: Bilateral upper extremity supported Standing balance-Leahy Scale: Poor                               Pertinent Vitals/Pain Pain Assessment: No/denies pain Faces Pain Scale: No hurt    Home Living Family/patient expects to be discharged to:: Skilled nursing facility                 Additional Comments: Per chart pt was living at home and fairly independent pta    Prior Function           Comments: No family present to relate PLOF     Hand Dominance   Dominant Hand: Right    Extremity/Trunk Assessment   Upper Extremity Assessment Upper Extremity Assessment: Defer to OT evaluation    Lower Extremity Assessment Lower Extremity Assessment: Generalized weakness;RLE deficits/detail;LLE deficits/detail RLE Deficits / Details: Moves legs spontaneously, but not purposefully RLE Coordination: decreased fine motor LLE Deficits / Details: moves legs spontaneously, but not purposefully LLE Coordination: decreased fine motor       Communication   Communication: Other (comment)(h/o stuttering)  Cognition Arousal/Alertness: Lethargic;Suspect due to medications Behavior During Therapy: Flat affect Overall Cognitive Status: History of cognitive impairments -  at baseline                                 General Comments: no family here to determine if this is worse than baseline. Pt was oriented to name, DOB , and [place] Comments      Exercises     Assessment/Plan    PT Assessment  Patient needs continued PT services  PT Problem List Decreased strength;Decreased activity tolerance;Decreased balance;Decreased mobility;Decreased coordination;Decreased knowledge of use of DME       PT Treatment Interventions DME instruction;Gait training;Functional mobility training;Therapeutic activities;Balance training;Patient/family education;Neuromuscular re-education    PT Goals (Current goals can be found in the Care Plan section)  Acute Rehab PT Goals Patient Stated Goal: to get warm and get OJ PT Goal Formulation: With patient Time For Goal Achievement: 10/30/18 Potential to Achieve Goals: Fair    Frequency Min 2X/week   Barriers to discharge        Co-evaluation PT/OT/SLP Co-Evaluation/Treatment: Yes Reason for Co-Treatment: Complexity of the patient's impairments (multi-system involvement);For patient/therapist safety PT goals addressed during session: Mobility/safety with mobility OT goals addressed during session: ADL's and self-care       AM-PAC PT "6 Clicks" Mobility  Outcome Measure Help needed turning from your back to your side while in a flat bed without using bedrails?: A Lot Help needed moving from lying on your back to sitting on the side of a flat bed without using bedrails?: A Lot Help needed moving to and from a bed to a chair (including a wheelchair)?: A Lot Help needed standing up from a chair using your arms (e.g., wheelchair or bedside chair)?: A Lot Help needed to walk in hospital room?: A Lot Help needed climbing 3-5 steps with a railing? : A Lot 6 Click Score: 12    End of Session   Activity Tolerance: Patient tolerated treatment well Patient left: in chair;with call bell/phone within reach;with chair alarm set Nurse Communication: Mobility status PT Visit Diagnosis: Unsteadiness on feet (R26.81);Other symptoms and signs involving the nervous system (R29.898);Other abnormalities of gait and mobility (R26.89)    Time: 1027-2536 PT Time  Calculation (min) (ACUTE ONLY): 24 min   Charges:   PT Evaluation $PT Eval Moderate Complexity: 1 Mod          10/16/2018  Donnella Sham, PT Acute Rehabilitation Services [phone]  (pager) [phone]  (office)  Patrick Rhodes 10/16/2018, 5:31 PM

## 2018-10-16 NOTE — Procedures (Signed)
Came bedside for echo, but patient has gone to CT at this time.

## 2018-10-17 ENCOUNTER — Other Ambulatory Visit (HOSPITAL_COMMUNITY): Payer: Medicare (Managed Care)

## 2018-10-17 DIAGNOSIS — G9340 Encephalopathy, unspecified: Secondary | ICD-10-CM

## 2018-10-17 DIAGNOSIS — I679 Cerebrovascular disease, unspecified: Secondary | ICD-10-CM

## 2018-10-17 LAB — CBC
HCT: 30.4 % — ABNORMAL LOW (ref 39.0–52.0)
Hemoglobin: 9.7 g/dL — ABNORMAL LOW (ref 13.0–17.0)
MCH: 27.6 pg (ref 26.0–34.0)
MCHC: 31.9 g/dL (ref 30.0–36.0)
MCV: 86.6 fL (ref 80.0–100.0)
Platelets: 237 10*3/uL (ref 150–400)
RBC: 3.51 MIL/uL — ABNORMAL LOW (ref 4.22–5.81)
RDW: 11.6 % (ref 11.5–15.5)
WBC: 5.5 10*3/uL (ref 4.0–10.5)
nRBC: 0 % (ref 0.0–0.2)

## 2018-10-17 LAB — BASIC METABOLIC PANEL
Anion gap: 7 (ref 5–15)
BUN: 16 mg/dL (ref 8–23)
CO2: 26 mmol/L (ref 22–32)
Calcium: 9.8 mg/dL (ref 8.9–10.3)
Chloride: 103 mmol/L (ref 98–111)
Creatinine, Ser: 0.69 mg/dL (ref 0.61–1.24)
GFR calc Af Amer: >60 mL/min (ref >60)
GFR calc non Af Amer: >60 mL/min (ref >60)
Glucose, Bld: 95 mg/dL (ref 70–99)
Potassium: 3.9 mmol/L (ref 3.5–5.1)
Sodium: 136 mmol/L (ref 135–145)

## 2018-10-17 LAB — HEMOGLOBIN A1C
Hgb A1c MFr Bld: 5.4 % (ref 4.8–5.6)
Mean Plasma Glucose: 108.28 mg/dL

## 2018-10-17 MED ORDER — ASPIRIN EC 325 MG PO TBEC
325.0000 mg | DELAYED_RELEASE_TABLET | Freq: Every day | ORAL | Status: DC
  Administered 2018-10-18 – 2018-10-19 (×2): 325 mg via ORAL
  Filled 2018-10-17 (×2): qty 1

## 2018-10-17 MED ORDER — CLOPIDOGREL BISULFATE 75 MG PO TABS
75.0000 mg | ORAL_TABLET | Freq: Every day | ORAL | Status: DC
  Administered 2018-10-17 – 2018-10-19 (×3): 75 mg via ORAL
  Filled 2018-10-17 (×3): qty 1

## 2018-10-17 MED ORDER — ASPIRIN EC 81 MG PO TBEC: 81.0000 mg | DELAYED_RELEASE_TABLET | Freq: Every day | ORAL | Status: DC

## 2018-10-17 NOTE — Progress Notes (Signed)
Pt.  Has been sleeping a lot this shift.  He hardly slept last night so he is catching up on sleep.  He is hard to arouse and keep awake to get neuro assessments done.

## 2018-10-17 NOTE — Progress Notes (Signed)
Son not at bedside this evening, son will need stroke education reviewed when he returns tomorrow before discharge. Pt not able to comprehend information.

## 2018-10-17 NOTE — Progress Notes (Addendum)
   Subjective:  Patrick Rhodes is a 85 y.o. with PMH of CVA, hypothyroidism, HTN admit for altered mental status on hospital day 4  Mr.Patrick Rhodes was examined and evaluated at bedside this AM with family present. He has no acute complaints at this time. Continuing to complain of his shoulder. His family mentions that he spit out his food and he suspects it is due to bland taste. Family requesting that his diet be changed from Dysphagia 1 to regular or soft diet. His family provides additional history about his presenting symptoms stating 2 weeks prior to admission, he noticed that Patrick Rhodes had difficulty getting out of chair and endorsed some loss of appetite with mood changes. He states those symptoms resolved intermittently but returned week before admission as well. Results of Mr.Patrick Rhodes's imaging studies were explained to family.  Objective:  Vital signs in last 24 hours: Vitals:   10/16/18 0539 10/16/18 1434 10/16/18 2236 10/17/18 0607  BP:  (!) 163/96 137/81 (!) 145/88  Pulse:  83 86 81  Resp:   14 16  Temp: 98.9 F (37.2 C) 97.7 F (36.5 C) 97.7 F (36.5 C) (!) 97.5 F (36.4 C)  TempSrc: Oral  Oral Oral  SpO2:  97% 97% 100%  Weight:       Gen: Cachetic appearing, not in acute distress Neck: supple, ROM intact CV: RRR, S1, S2 normal, systolic murmur on left sternal border Abd: Soft, BS+, NTND, No rebound, no guarding Extm: ROM intact, Peripheral pulses intact, No peripheral edema Skin: Dry, Warm, normal turgor Neuro: AAOx1 to name. Unable to perform full neuro exam  Assessment/Plan:  Principal Problem:   Cerebellar stroke ([place]) Active Problems:   Sepsis ([place])   Pneumonia of left lower lobe due to infectious organism [facility])   Infectious encephalopathy   Hypercalcemia  Patrick Rhodes is a 85 y.o. with PMH of CVA, Hypothyroidism, HTN and HLD admit for altered mental status. His mentation appears to not be improving significantly resolving leukocytosis and no  obvious signs of further infectious etiology. He was found to have acute stroke on MRI, which possibly occurred prior to admission per his son's observations. His current mentation may be his new baseline and he may not recover fully. Currently awaiting echocardiogram for evaluation for PFO.  Leukocytosis, altered mental status 2/2 CAP vs Sinusitis WBC 12.0->8.4->5.5 Afebrile overnight. BP stable at 137/81. CT head showing right maxillary sinus opacifications. No cough or desaturation event since admission. - C/w ceftriaxone (day 4/5)  - Trend CBC  Acute Left cerebellar PICA territory infarct MRI showing non-hemorrhagic acute left cerebellar infarct as well as superior left frontal meningioma. 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 Carotid dopplers showing no significant stenosis. Currently awaiting TTE for PFO evaluation. Neuro following. PT/OT recommending SNF discharge - Appreciate neuro recs - F/u TTE - Social work consult for placement - C/w Asa   Constipation No obvious guarding or tenderness on exam today. No bowel movement reocrded - C/w Miralax 17g BID, Senokot 1 tab BID  DVT prophx: Lovenox Diet: Soft diet Bowel: Senokot, Miralax Code: Full  Dispo: Anticipated discharge in approximately 2-3 day(s).   [name], MD 10/17/2018, 9:55 AM Pager: [phone]

## 2018-10-17 NOTE — Care Management Important Message (Signed)
Important Message  Patient Details  Name: Patrick Rhodes MRN: [id number] Date of Birth: 20-Sep-1929   Medicare Important Message Given:  Yes Patient did not sign but requested to have a unsigned copy left.   Jaliah Foody 10/17/2018, 3:55 PM

## 2018-10-17 NOTE — Progress Notes (Signed)
CSW spoke with patient's [facility] worker again. Patient's son reported that he would prefer for patient to return home at discharge. Patient already has home health set up. [facility] will provide transportation home. RNCM aware.  CSW signing off.   Percell Locus [place] LCSW [phone]

## 2018-10-17 NOTE — Progress Notes (Addendum)
STROKE TEAM PROGRESS NOTE   INTERVAL HISTORY His son and SW are at bedside. They are discussing about SNF placement. pt no acute event overnight, neuro stable, still has severe dysarthria and not orientated to time and place. CTA head and neck showed multifocal intracranial stenosis as well as left VA occlusion which likely explains his left cerebellar infarct.   Vitals:   10/16/18 0539 10/16/18 1434 10/16/18 2236 10/17/18 0607  BP:  (!) 163/96 137/81 (!) 145/88  Pulse:  83 86 81  Resp:   14 16  Temp: 98.9 F (37.2 C) 97.7 F (36.5 C) 97.7 F (36.5 C) (!) 97.5 F (36.4 C)  TempSrc: Oral  Oral Oral  SpO2:  97% 97% 100%  Weight:        CBC:  Recent Labs  Lab 10/13/18 1406  10/16/18 0610 10/17/18 0418  WBC 13.6*   < > 8.4 5.5  NEUTROABS 11.4*  --   --   --   HGB 10.9*   < > 10.3* 9.7*  HCT 34.9*   < > 32.3* 30.4*  MCV 89.9   < > 87.5 86.6  PLT 314   < > 346 237   < > = values in this interval not displayed.    Basic Metabolic Panel:  Recent Labs  Lab 10/16/18 0610 10/17/18 0418  NA 137 136  K 3.9 3.9  CL 102 103  CO2 19* 26  GLUCOSE 59* 95  BUN 17 16  CREATININE 0.89 0.69  CALCIUM 10.1 9.8   Lipid Panel:     Component Value Date/Time   CHOL 140 10/16/2018 0610   TRIG 59 10/16/2018 0610   HDL 36 (L) 10/16/2018 0610   CHOLHDL 3.9 10/16/2018 0610   VLDL 12 10/16/2018 0610   LDLCALC 92 10/16/2018 0610   HgbA1c:  Lab Results  Component Value Date   HGBA1C 5.4 10/17/2018   Urine Drug Screen: No results found for: LABOPIA, COCAINSCRNUR, LABBENZ, AMPHETMU, THCU, LABBARB  Alcohol Level No results found for: ETH  IMAGING Ct Angio Head W Or Wo Contrast  Result Date: 10/16/2018 CLINICAL DATA:  Follow up stroke. EXAM: CT ANGIOGRAPHY HEAD AND NECK TECHNIQUE: Multidetector CT imaging of the head and neck was performed using the standard protocol during bolus administration of intravenous contrast. Multiplanar CT image reconstructions and MIPs were obtained to  evaluate the vascular anatomy. Carotid stenosis measurements (when applicable) are obtained utilizing NASCET criteria, using the distal internal carotid diameter as the denominator. CONTRAST:  71mL ISOVUE-370 IOPAMIDOL (ISOVUE-370) INJECTION 76% COMPARISON:  MRI head October 16, 2026 CT HEAD October 15, 2018. FINDINGS: CT HEAD FINDINGS BRAIN: Evolving LEFT inferior cerebellar infarct. Old LEFT cerebellar infarcts. Cavum septum pellucidum et vergae; no hydrocephalus. Old basal ganglia and thalami small infarcts. No intraparenchymal hemorrhage, mass effect, midline shift or acute large vascular territory infarct. Patchy supratentorial white matter hypodensities. No abnormal extra-axial fluid collections. 11 x 16 mm LEFT frontal meningioma with associated bony changes. VASCULAR: Mild calcific atherosclerosis of the carotid siphons. SKULL: No skull fracture. Severe RIGHT temporomandibular osteoarthrosis. No significant scalp soft tissue swelling. SINUSES/ORBITS: Moderate RIGHT maxillary sinusitis. RIGHT mastoid effusion. Included ocular globes and orbital contents are non-suspicious. OTHER: None. CTA NECK FINDINGS: AORTIC ARCH: Mild calcific atherosclerosis in intimal thickening of aortic arch, origin of innominate artery not included in field of view. The innominate, left Common carotid and subclavian arteries are patent. RIGHT CAROTID SYSTEM: Common carotid artery is patent. Mild calcific atherosclerosis of the carotid bifurcation without hemodynamically significant  stenosis by NASCET criteria. Patent internal carotid artery; mild luminal irregularity seen with atherosclerosis. LEFT CAROTID SYSTEM: Common carotid artery is patent. Mild calcific atherosclerosis of the carotid bifurcation without hemodynamically significant stenosis by NASCET criteria. Normal appearance of the internal carotid artery. VERTEBRAL ARTERIES:Left vertebral artery occluded from the origin without reconstitution in the neck. Patent RIGHT  vertebral arteries mild extrinsic compression due to degenerative cervical spine. Moderate stenosis RIGHT vertebral artery origin. SKELETON: No acute osseous process though bone windows have not been submitted. Patient is edentulous. OTHER NECK: Soft tissues of the neck are nonacute though, not tailored for evaluation. Mildly atrophic major salivary glands. UPPER CHEST: Mild biapical scarring. No superior mediastinal lymphadenopathy. CTA HEAD FINDINGS: ANTERIOR CIRCULATION: Patent cervical internal carotid arteries, petrous, cavernous and supra clinoid internal carotid arteries. 2 mm laterally directed outpouching LEFT cavernous ICA (series 11, image 117). Patent anterior communicating artery. Patent anterior and middle cerebral arteries. Severe stenoses LEFT M2 segment. Moderate luminal irregularity anterior and middle cerebral arteries compatible with atherosclerosis. No large vessel occlusion, contrast extravasation. POSTERIOR CIRCULATION: Reconstitution of LEFT vertebral artery at V4 segment. Patent bilateral PICA. Patent vertebrobasilar system. Flow-limiting stenosis LEFT P1 segment with immediate reconstitution. Moderate stenosis bilateral posterior cerebral arteries compatible with atherosclerosis. No large vessel occlusion, contrast extravasation or aneurysm. VENOUS SINUSES: Major dural venous sinuses are patent though not tailored for evaluation on this angiographic examination. ANATOMIC VARIANTS: None. DELAYED PHASE: No abnormal intraparenchymal enhancement. Homogeneous enhancement of known LEFT frontal meningioma. MIP images reviewed. IMPRESSION: CT HEAD: 1. Evolving LEFT cerebellar/PICA territory nonhemorrhagic infarct. 2. Old basal ganglia, thalami and LEFT cerebellar infarcts. 3. Moderate chronic small vessel ischemic changes. 4. LEFT frontal meningioma better demonstrated on the today's MRI head. CTA NECK: 1. Occluded LEFT vertebral artery without reconstitution. 2. No hemodynamically significant  stenosis ICA. CTA HEAD: 1. No emergent large vessel occlusion; reconstitution LEFT vertebral artery at V4 segment, patent PICA. 2. Severe stenoses LEFT MCA, LEFT PCA. Electronically Signed   By: Elon Alas M.D.   On: 10/16/2018 23:33   Ct Angio Neck W Or Wo Contrast  Result Date: 10/16/2018 CLINICAL DATA:  Follow up stroke. EXAM: CT ANGIOGRAPHY HEAD AND NECK TECHNIQUE: Multidetector CT imaging of the head and neck was performed using the standard protocol during bolus administration of intravenous contrast. Multiplanar CT image reconstructions and MIPs were obtained to evaluate the vascular anatomy. Carotid stenosis measurements (when applicable) are obtained utilizing NASCET criteria, using the distal internal carotid diameter as the denominator. CONTRAST:  35mL ISOVUE-370 IOPAMIDOL (ISOVUE-370) INJECTION 76% COMPARISON:  MRI head October 16, 2026 CT HEAD October 15, 2018. FINDINGS: CT HEAD FINDINGS BRAIN: Evolving LEFT inferior cerebellar infarct. Old LEFT cerebellar infarcts. Cavum septum pellucidum et vergae; no hydrocephalus. Old basal ganglia and thalami small infarcts. No intraparenchymal hemorrhage, mass effect, midline shift or acute large vascular territory infarct. Patchy supratentorial white matter hypodensities. No abnormal extra-axial fluid collections. 11 x 16 mm LEFT frontal meningioma with associated bony changes. VASCULAR: Mild calcific atherosclerosis of the carotid siphons. SKULL: No skull fracture. Severe RIGHT temporomandibular osteoarthrosis. No significant scalp soft tissue swelling. SINUSES/ORBITS: Moderate RIGHT maxillary sinusitis. RIGHT mastoid effusion. Included ocular globes and orbital contents are non-suspicious. OTHER: None. CTA NECK FINDINGS: AORTIC ARCH: Mild calcific atherosclerosis in intimal thickening of aortic arch, origin of innominate artery not included in field of view. The innominate, left Common carotid and subclavian arteries are patent. RIGHT CAROTID  SYSTEM: Common carotid artery is patent. Mild calcific atherosclerosis of the carotid bifurcation without hemodynamically  significant stenosis by NASCET criteria. Patent internal carotid artery; mild luminal irregularity seen with atherosclerosis. LEFT CAROTID SYSTEM: Common carotid artery is patent. Mild calcific atherosclerosis of the carotid bifurcation without hemodynamically significant stenosis by NASCET criteria. Normal appearance of the internal carotid artery. VERTEBRAL ARTERIES:Left vertebral artery occluded from the origin without reconstitution in the neck. Patent RIGHT vertebral arteries mild extrinsic compression due to degenerative cervical spine. Moderate stenosis RIGHT vertebral artery origin. SKELETON: No acute osseous process though bone windows have not been submitted. Patient is edentulous. OTHER NECK: Soft tissues of the neck are nonacute though, not tailored for evaluation. Mildly atrophic major salivary glands. UPPER CHEST: Mild biapical scarring. No superior mediastinal lymphadenopathy. CTA HEAD FINDINGS: ANTERIOR CIRCULATION: Patent cervical internal carotid arteries, petrous, cavernous and supra clinoid internal carotid arteries. 2 mm laterally directed outpouching LEFT cavernous ICA (series 11, image 117). Patent anterior communicating artery. Patent anterior and middle cerebral arteries. Severe stenoses LEFT M2 segment. Moderate luminal irregularity anterior and middle cerebral arteries compatible with atherosclerosis. No large vessel occlusion, contrast extravasation. POSTERIOR CIRCULATION: Reconstitution of LEFT vertebral artery at V4 segment. Patent bilateral PICA. Patent vertebrobasilar system. Flow-limiting stenosis LEFT P1 segment with immediate reconstitution. Moderate stenosis bilateral posterior cerebral arteries compatible with atherosclerosis. No large vessel occlusion, contrast extravasation or aneurysm. VENOUS SINUSES: Major dural venous sinuses are patent though not  tailored for evaluation on this angiographic examination. ANATOMIC VARIANTS: None. DELAYED PHASE: No abnormal intraparenchymal enhancement. Homogeneous enhancement of known LEFT frontal meningioma. MIP images reviewed. IMPRESSION: CT HEAD: 1. Evolving LEFT cerebellar/PICA territory nonhemorrhagic infarct. 2. Old basal ganglia, thalami and LEFT cerebellar infarcts. 3. Moderate chronic small vessel ischemic changes. 4. LEFT frontal meningioma better demonstrated on the today's MRI head. CTA NECK: 1. Occluded LEFT vertebral artery without reconstitution. 2. No hemodynamically significant stenosis ICA. CTA HEAD: 1. No emergent large vessel occlusion; reconstitution LEFT vertebral artery at V4 segment, patent PICA. 2. Severe stenoses LEFT MCA, LEFT PCA. Electronically Signed   By: Elon Alas M.D.   On: 10/16/2018 23:33   Mr Jeri Cos [place] Contrast  Result Date: 10/16/2018 CLINICAL DATA:  Altered mental status EXAM: MRI HEAD WITHOUT AND WITH CONTRAST TECHNIQUE: Multiplanar, multiecho pulse sequences of the brain and surrounding structures were obtained without and with intravenous contrast. CONTRAST:  6 mL Gadavist COMPARISON:  Head CT 10/15/2018 FINDINGS: BRAIN: There is acute ischemia within the left cerebellar hemisphere, within the PICA distribution. No supratentorial ischemia. Incidentally noted cavum septum pellucidum et vergae. No midline shift or other mass effect. Diffuse confluent hyperintense T2-weighted signal within the periventricular, deep and juxtacortical white matter, most commonly due to chronic ischemic microangiopathy. There are multiple old lacunar infarcts of the basal ganglia and centrum semiovale. There is a left frontal convexity meningioma measuring 18 x 11 mm. No underlying edema. Generalized atrophy without lobar predilection. Susceptibility-sensitive sequences show no chronic microhemorrhage or superficial siderosis. VASCULAR: Abnormal left vertebral artery flow void. The other major  flow voids at the skull base are normal. SKULL AND UPPER CERVICAL SPINE: Calvarial bone marrow signal is normal. There is no skull base mass. Visualized upper cervical spine and soft tissues are normal. SINUSES/ORBITS: Right mastoid effusion. Bubbly secretions in the right maxillary sinus. There are bilateral lens replacements. IMPRESSION: 1. Nonhemorrhagic acute infarct of the left cerebellum PICA territory. Mild edema without mass effect. 2. Abnormal left vertebral artery flow void may indicate stenosis/reduced flow or dissection in the context of left PICA territory infarct. MRA of the neck with without contrast might  be helpful. 3. Advanced chronic microvascular ischemia and generalized atrophy. 4. Superior left frontal convexity meningioma without abnormality of the underlying brain parenchyma. Electronically Signed   By: Ulyses Jarred M.D.   On: 10/16/2018 15:05   Vas US Carotid  Result Date: 10/16/2018 Carotid Arterial Duplex Study Indications:       CVA and Stroke. Risk Factors:      Hypertension, hyperlipidemia. Limitations:       Stiffness of the patient's neck. Comparison Study:  MRI on 10/16/2018 with positive right cerebellar stroke.                    Negative BLEV on 10/16/2018. Performing Technologist: Rudell Cobb  Examination Guidelines: A complete evaluation includes B-mode imaging, spectral Doppler, color Doppler, and power Doppler as needed of all accessible portions of each vessel. Bilateral testing is considered an integral part of a complete examination. Limited examinations for reoccurring indications may be performed as noted.  Right Carotid Findings: +----------+--------+--------+--------+------------------+------------------+           PSV cm/sEDV cm/sStenosisDescribe          Comments           +----------+--------+--------+--------+------------------+------------------+ CCA Prox  65      19                                                    +----------+--------+--------+--------+------------------+------------------+ CCA Distal56      18                                intimal thickening +----------+--------+--------+--------+------------------+------------------+ ICA Prox  47      15      1-39%   focal and calcific                   +----------+--------+--------+--------+------------------+------------------+ ICA Distal77      32                                                   +----------+--------+--------+--------+------------------+------------------+ ECA       56      11                                                   +----------+--------+--------+--------+------------------+------------------+ +----------+--------+-------+--------+-------------------+           PSV cm/sEDV cmsDescribeArm Pressure (mmHG) +----------+--------+-------+--------+-------------------+ [id number]                                         +----------+--------+-------+--------+-------------------+ +---------+--------+---+--------+--+--------------------------------+ VertebralPSV cm/s118EDV cm/s33Antegrade and elevated velocity. +---------+--------+---+--------+--+--------------------------------+  Left Carotid Findings: +----------+--------+--------+--------+---------------------+------------------+           PSV cm/sEDV cm/sStenosisDescribe             Comments           +----------+--------+--------+--------+---------------------+------------------+ CCA Prox  52      16                                                      +----------+--------+--------+--------+---------------------+------------------+  CCA Distal52      17              diffuse and          intimal thickening                                   hypoechoic                              +----------+--------+--------+--------+---------------------+------------------+ ICA Prox  64      26      1-39%   diffuse and calcific                     +----------+--------+--------+--------+---------------------+------------------+ ICA Distal88      42                                                      +----------+--------+--------+--------+---------------------+------------------+ ECA       55      11                                                      +----------+--------+--------+--------+---------------------+------------------+ +----------+--------+--------+--------+-------------------+ SubclavianPSV cm/sEDV cm/sDescribeArm Pressure (mmHG) +----------+--------+--------+--------+-------------------+           143                                         +----------+--------+--------+--------+-------------------+ +---------+--------+--+--------+--+---------+ VertebralPSV cm/s39EDV cm/s17Antegrade +---------+--------+--+--------+--+---------+  Summary: Right Carotid: Velocities in the right ICA are consistent with a 1-39% stenosis. Left Carotid: Velocities in the left ICA are consistent with a 1-39% stenosis. Vertebrals: Bilateral vertebral arteries demonstrate antegrade flow. *See table(s) above for measurements and observations.     Preliminary    Vas [place] Lower Extremity Venous (dvt)  Result Date: 10/16/2018  Lower Venous Study Indications: Swelling.  Limitations: Patient uncooperative and combative, unable to lie in proper position for exam. Performing Technologist: Maudry Mayhew MHA, RDMS, RVT, RDCS  Examination Guidelines: A complete evaluation includes B-mode imaging, spectral Doppler, color Doppler, and power Doppler as needed of all accessible portions of each vessel. Bilateral testing is considered an integral part of a complete examination. Limited examinations for reoccurring indications may be performed as noted.  Right Venous Findings: +---------+---------------+---------+-----------+----------+--------------+          CompressibilityPhasicitySpontaneityPropertiesSummary         +---------+---------------+---------+-----------+----------+--------------+ FV Prox  Full                                                        +---------+---------------+---------+-----------+----------+--------------+ FV Mid   Full                                                        +---------+---------------+---------+-----------+----------+--------------+  FV DistalFull                                                        +---------+---------------+---------+-----------+----------+--------------+ PFV                                                   Not visualized +---------+---------------+---------+-----------+----------+--------------+ POP                                                   Not visualized +---------+---------------+---------+-----------+----------+--------------+ PTV      Full                                                        +---------+---------------+---------+-----------+----------+--------------+ PERO     Full           Yes      Yes                                 +---------+---------------+---------+-----------+----------+--------------+  Left Venous Findings: +---------+---------------+---------+-----------+----------+--------------+          CompressibilityPhasicitySpontaneityPropertiesSummary        +---------+---------------+---------+-----------+----------+--------------+ CFV      Full           Yes      Yes                                 +---------+---------------+---------+-----------+----------+--------------+ SFJ      Full                                                        +---------+---------------+---------+-----------+----------+--------------+ FV Prox  Full                                                        +---------+---------------+---------+-----------+----------+--------------+ FV Mid                           Yes                                  +---------+---------------+---------+-----------+----------+--------------+ FV Distal                                             Not visualized +---------+---------------+---------+-----------+----------+--------------+  POP                                                   Not visualized +---------+---------------+---------+-----------+----------+--------------+ PTV                                                   Not visualized +---------+---------------+---------+-----------+----------+--------------+ PERO                                                  Not visualized +---------+---------------+---------+-----------+----------+--------------+    Summary: Right: There is no evidence of deep vein thrombosis in the lower extremity. However, portions of this examination were limited- see technologist comments above. No cystic structure found in the popliteal fossa. Left: There is no evidence of deep vein thrombosis in the lower extremity. However, portions of this examination were limited- see technologist comments above. No cystic structure found in the popliteal fossa.  *See table(s) above for measurements and observations. Electronically signed by Harold Barban MD on 10/16/2018 at 7:07:51 PM.    Final     PHYSICAL EXAM  Temp:  [97.5 F (36.4 C)-97.7 F (36.5 C)] 97.5 F (36.4 C) (02/26 0607) Pulse Rate:  [81-86] 81 (02/26 0607) Resp:  [14-16] 16 (02/26 0607) BP: (137-163)/(81-96) 145/88 (02/26 0607) SpO2:  [97 %-100 %] 100 % (02/26 0607)  General - Well nourished, well developed, not in acute distress.  Ophthalmologic - fundi not visualized due to noncooperation.  Cardiovascular - irregular heart rhythm, bigeminy on telemetry.  Neuro - awake, alert, eyes able to open intermittently with left eyelid opening apraxia.  He is orientated to his name and age, however not orientated to place, time or situation.  Fluent speech, but severe dysarthria with intermittent  intangible words, follow limited simple commands.  PERRL, able to attend both sides, however slow saccade, blinking to visual threat bilaterally.  Right nasolabial fold mild flattening, tongue midline inside mouth.  Bilateral upper extremities 4/5, bilateral lower extremity proximal 2/5, distal 3/5.  DTR 1+, no Babinski.  Bilateral upper extremity finger-to-nose slow but no ataxia.  Sensation not corporative, gait not tested.    ASSESSMENT/PLAN Patrick Rhodes is a 85 y.o. male with history of hypothyroidism, HTN, vascular dementia and prior stroke presenting with increasing fatigue, lethargy, decreased appetite, generalized weakness and difficulty ambulating.  Stroke:  left cerebellar infarct, likely due to large vessel stenosis with multifocal athero including left VA. However, cardioembolic etiology can not be completely ruled out  Resultant lethargy and disorientation  CT head suspect L cerebellar infarct. Old L cerebellar infarct (new from previous CT). Old L CR, L PLIC, R LN infarct Small vessel disease.   MRI  Acute L cerebellar PICA infarct. Mild edema. Abnormal L VA flow void.   CTA head & neck left VA occlusion with V4 recon, left PCA tandem stenosis and left M2 stenosis  Carotid doppler unremarkable   2D Echo  pending   LE venous Doppler negative for DVT  Recommend 30 day cardiac event monitoring at outpt to rule out afib  LDL 92  HgbA1c  5.4  Lovenox 40 mg sq daily for VTE prophylaxis  aspirin 81 mg daily prior to admission, now on aspirin 325 mg daily. Given intracranial high grade stenosis/occlusion with left VA, recommend ASA 325mg  and plavix 75mg  for 3 months and then plavix alone.   Therapy recommendations: SNF  Disposition:  pending  (ambulated independently, needs help w/ bathing, [facility])  Hypertension  Home meds: cozaar 100  Stable . Permissive hypertension (OK if < 220/120) but gradually normalize in 3-5 days . Long-term BP goal 130-150  given multifocal intracranial high grade stenosis  Hyperlipidemia  Home meds:  No statin  LDL 92, goal < 70  Now on Lipitor 20  Continue statin at discharge  Meningioma, incidental finding  My brain showed superior L frontal convexity meningioma.   No abnormality of underlying brain parenchyma  No intervention needed at this time  Continue monitoring  Other Stroke Risk Factors  Advanced age  Hx stroke/TIA - on imaging, old BG, thalami and left cerebellar infarcts  Other Active Problems  Hypothyroidism  Leukocytosis, resolved WBC 12.0->8.4->5.5  Vascular dementia - baseline, he ambulates on his own but needs some help with bathing and [facility]. He stutters at baseline.  ? Left lower lobe atelectasis versus peribronchial airspace consolidation - afebrile - on rocephin  Hospital day # 4  Neurology will sign off. Please call with questions. Pt will follow up with stroke clinic NP at [facility] in about 4 weeks. Thanks for the consult.  Rosalin Hawking, MD PhD Stroke Neurology 10/17/2018 11:57 AM    To contact Stroke Continuity provider, please refer to [email or web address] After hours, contact General Neurology

## 2018-10-17 NOTE — Progress Notes (Signed)
  Date: 10/17/2018  Patient name: Patrick Rhodes  Medical record number: [id number]  Date of birth: 07/22/1930   I have seen and evaluated this patient and I have discussed the plan of care with the house staff. Please see their note for complete details. I concur with their findings with the following additions/corrections:   MRI clearly shows acute ischemic stroke in the left PICA territory.  This is likely the cause of all the symptoms that he has presented with.  Unfortunately, he remains very confused and difficult to understand with dysarthric speech.  He remains very focused on his collarbone and shoulder, which has been evaluated and does not have any acute fractures.  He has evidence of multiple chronic infarctions and microvascular changes that are likely contributing to his poor mentation.  On admission, there was concern for infection as a source of his symptoms, and a left lower lobe opacity was identified concerning for pneumonia.  Tomorrow, he will receive day 5 of his course of antibiotics for this, although in retrospect it is more likely this was atelectasis rather than a pneumonia causing his symptoms.  He has been afebrile here with no cough.  We will work with social work and pace to determine appropriate discharge plan for him.  He likely will need 24-hour supervision going forward given his significant confusion and weakness.  He also will need assistance with all of his meals.  [name], M.D., Ph.D. 10/17/2018, 3:35 PM

## 2018-10-18 ENCOUNTER — Inpatient Hospital Stay (HOSPITAL_COMMUNITY): Payer: Medicare (Managed Care)

## 2018-10-18 DIAGNOSIS — I340 Nonrheumatic mitral (valve) insufficiency: Secondary | ICD-10-CM

## 2018-10-18 DIAGNOSIS — I361 Nonrheumatic tricuspid (valve) insufficiency: Secondary | ICD-10-CM

## 2018-10-18 LAB — CBC
HCT: 31.8 % — ABNORMAL LOW (ref 39.0–52.0)
Hemoglobin: 10.3 g/dL — ABNORMAL LOW (ref 13.0–17.0)
MCH: 28.3 pg (ref 26.0–34.0)
MCHC: 32.4 g/dL (ref 30.0–36.0)
MCV: 87.4 fL (ref 80.0–100.0)
Platelets: 353 K/uL (ref 150–400)
RBC: 3.64 MIL/uL — ABNORMAL LOW (ref 4.22–5.81)
RDW: 11.5 % (ref 11.5–15.5)
WBC: 5.6 K/uL (ref 4.0–10.5)
nRBC: 0 % (ref 0.0–0.2)

## 2018-10-18 LAB — CULTURE, BLOOD (ROUTINE X 2)
Culture: NO GROWTH
Culture: NO GROWTH

## 2018-10-18 LAB — BASIC METABOLIC PANEL WITH GFR
Anion gap: 8 (ref 5–15)
BUN: 13 mg/dL (ref 8–23)
CO2: 28 mmol/L (ref 22–32)
Calcium: 9.8 mg/dL (ref 8.9–10.3)
Chloride: 101 mmol/L (ref 98–111)
Creatinine, Ser: 0.76 mg/dL (ref 0.61–1.24)
GFR calc Af Amer: >60 mL/min
GFR calc non Af Amer: >60 mL/min
Glucose, Bld: 109 mg/dL — ABNORMAL HIGH (ref 70–99)
Potassium: 3.8 mmol/L (ref 3.5–5.1)
Sodium: 137 mmol/L (ref 135–145)

## 2018-10-18 LAB — ECHOCARDIOGRAM COMPLETE: Weight: 2080 oz

## 2018-10-18 MED ORDER — SODIUM CHLORIDE 0.9 % IV SOLN
2.0000 g | INTRAVENOUS | Status: AC
  Administered 2018-10-18: 2 g via INTRAVENOUS
  Filled 2018-10-18: qty 20

## 2018-10-18 MED ORDER — IOPAMIDOL (ISOVUE-370) INJECTION 76%
INTRAVENOUS | Status: AC
  Administered 2018-10-18: 100 mL
  Filled 2018-10-18: qty 100

## 2018-10-18 NOTE — Progress Notes (Signed)
  Date: 10/18/2018  Patient name: Patrick Rhodes  Medical record number: [id number]  Date of birth: 15-Aug-1930   I have seen and evaluated this patient and I have discussed the plan of care with the house staff. Please see their note for complete details. I concur with their findings with the following additions/corrections:   Seems to be doing about the same, still confused and difficult to have a coherent conversation.  Speech is also dysarthric, which makes it difficult.  Still having difficulty maintaining significant intake.  Appreciate assistance from SLP.  Unfortunately, TTE showed dilated aortic root and possible shadowing artifact concerning for possible dissection.  Since he cannot accurately describe symptoms to [place] right now, we will need to investigate further with a CTA.  [name], M.D., Ph.D. 10/18/2018, 2:21 PM

## 2018-10-18 NOTE — Progress Notes (Signed)
  Speech Language Pathology Treatment: Dysphagia  Patient Details Name: Patrick Rhodes MRN: [id number] DOB: [birth year] Today's Date: 10/18/2018 Time: 1583-0940 SLP Time Calculation (min) (ACUTE ONLY): 12 min  Assessment / Plan / Recommendation Clinical Impression  Pt is less resistant to feeder today and even participated mildly in self-feeding, but he does not consume much. Note that MD upgraded his diet from previously recommend pureed texture. His son is present and believes that the pt is nearing his baseline from a cognitive standpoint. Pt has moderately prolonged oral preparation but also will not accept additional POs (solid or liquid) until his mouth is completely clear. His son says that he is a picky eater and that there are a few specific things that he will want to eat. Although Dys 3 textures may be a little advanced for him, he would not be able to get most of these foods on a more modified diet. His son says that he chops his foods into small pieces for him. Therefore, would continue Dys 3 diet and thin liquids for now. Will f/u for tolerance briefly.   HPI HPI: Pt is an 85 y.o. admitted with AMS. CT is suggestive of an acute/subacute L cerebellar infarct. MRI pending. PMH of CVA, Hypothyroidism, HTN and HLD      SLP Plan  Continue with current plan of care       Recommendations  Diet recommendations: Dysphagia 3 (mechanical soft);Thin liquid Liquids provided via: Straw Medication Administration: Crushed with puree Supervision: Staff to assist with self feeding;Full supervision/cueing for compensatory strategies Compensations: Minimize environmental distractions;Slow rate;Small sips/bites Postural Changes and/or Swallow Maneuvers: Seated upright 90 degrees;Upright 30-60 min after meal                Oral Care Recommendations: Oral care BID Follow up Recommendations: Home health SLP;24 hour supervision/assistance (with [facility] support; per son's request) SLP Visit  Diagnosis: Dysphagia, unspecified (R13.10) Plan: Continue with current plan of care       [place] Jaivian Battaglini 10/18/2018, 11:47 AM  Pollyann Glen, M.A. [place] Acute Environmental education officer [phone] Office [phone]

## 2018-10-18 NOTE — Progress Notes (Signed)
Physical Therapy Treatment Patient Details Name: Patrick Rhodes MRN: [id number] DOB: 1930/07/10 Today's Date: 10/18/2018    History of Present Illness [name] is an 85 year old man with a history of prior cerebrovascular accident, hypothyroidism, and hypertension who lives with his son and attends the [facility] 5 days a week.  He generally is ambulatory and independent but over the last week his son noted increasing fatigue and weakness Confusion, urinary incontinence, decreased oral intake. Found to have Nonhemorrhagic acute infarct of the left cerebellum PICA territory, sepsis, PNA    PT Comments    Pt presented in bed and agreed to engage in gait training and transfer. Pt presented with stool incontinence and was cleaned prior to gait. Nursing informed of pt bowel movement. Pt required max vc/tc to assist with rolling and supine to sit to attempt sit to stand transfer. Pt continues to have heavy posterolateral lean at EOB and requires correction at times to keep upright.  Pt tolerated gait training well but is limited d/t fatigue and cognitive impairments. Pt posture flexed during gait and step length very short. Pt required clinicians to advance RW for him in order to take steps. Pt unable to use hands to locate recliner behind him and required mod A +2 to come safely to seated. Pt left with call bell and phone/all needs within reach. Pt had begun feeding himself lunch upon exit from the room. Pt current progress suggests that current d/c plan to SNF is appropriate. Plan to progress gait training and therex as tolerated.  Follow Up Recommendations  SNF;Supervision/Assistance - 24 hour     Equipment Recommendations  Other (comment)    Recommendations for Other Services       Precautions / Restrictions Precautions Precautions: Fall Restrictions Weight Bearing Restrictions: No    Mobility  Bed Mobility Overal bed mobility: Needs Assistance Bed Mobility: Rolling;Supine to  Sit Rolling: Mod assist   Supine to sit: Mod assist     General bed mobility comments: Pt presented with stool incontinence and required max vc/tc to roll to side to assist with cleaning and changing or bed pad.  Transfers Overall transfer level: Needs assistance Equipment used: 1 person hand held assist Transfers: Sit to/from Stand Sit to Stand: Mod assist            Ambulation/Gait Ambulation/Gait assistance: Mod assist;+2 physical assistance;+2 safety/equipment Gait Distance (Feet): 25 Feet Assistive device: 2 person hand held assist;Rolling walker (2 wheeled);1 person hand held assist(close behind chair follow) Gait Pattern/deviations: Decreased stride length;Decreased step length - right;Decreased step length - left;Step-through pattern Gait velocity: decreased   General Gait Details: Gait mildly unsteady and guarded. Pt required clinician to advance RW for him and as a result requires 2+ to progress gait training. Pt limitied d/t fatigue and cognitive impairments which resulted in the termination of gait training.    Stairs             Wheelchair Mobility    Modified Rankin (Stroke Patients Only)       Balance Overall balance assessment: Needs assistance Sitting-balance support: No upper extremity supported Sitting balance-Leahy Scale: Poor   Postural control: Posterior lean;Left lateral lean Standing balance support: Bilateral upper extremity supported Standing balance-Leahy Scale: Poor                              Cognition Arousal/Alertness: Awake/alert Behavior During Therapy: Flat affect Overall Cognitive Status: History of cognitive impairments -  at baseline                                 General Comments: no family here to determine if this is worse than baseline. Pt was oriented to name, DOB , and [place]      Exercises      General Comments        Pertinent Vitals/Pain Pain Assessment: No/denies  pain Faces Pain Scale: No hurt    Home Living                      Prior Function            PT Goals (current goals can now be found in the care plan section) Acute Rehab PT Goals PT Goal Formulation: Patient unable to participate in goal setting Potential to Achieve Goals: Fair    Frequency    Min 2X/week      PT Plan      Co-evaluation              AM-PAC PT "6 Clicks" Mobility   Outcome Measure  Help needed turning from your back to your side while in a flat bed without using bedrails?: A Lot Help needed moving from lying on your back to sitting on the side of a flat bed without using bedrails?: A Lot Help needed moving to and from a bed to a chair (including a wheelchair)?: A Lot Help needed standing up from a chair using your arms (e.g., wheelchair or bedside chair)?: A Lot Help needed to walk in hospital room?: A Lot Help needed climbing 3-5 steps with a railing? : A Lot 6 Click Score: 12    End of Session Equipment Utilized During Treatment: Gait belt Activity Tolerance: Patient tolerated treatment well Patient left: in chair;with call bell/phone within reach;with chair alarm set(Pt lunch tray left in front of him) Nurse Communication: Mobility status PT Visit Diagnosis: Unsteadiness on feet (R26.81);Other symptoms and signs involving the nervous system (R29.898);Other abnormalities of gait and mobility (R26.89)     Time: 1400-1426 PT Time Calculation (min) (ACUTE ONLY): 26 min  Charges:  $Gait Training: 8-22 mins $Therapeutic Activity: 8-22 mins                     Maryelizabeth Kaufmann, SPTA   Maryelizabeth Kaufmann 10/18/2018, 2:43 PM

## 2018-10-18 NOTE — Plan of Care (Signed)
  Problem: Fluid Volume: Goal: Hemodynamic stability will improve Outcome: Progressing   Problem: Clinical Measurements: Goal: Diagnostic test results will improve Outcome: Progressing Goal: Signs and symptoms of infection will decrease Outcome: Progressing   Problem: Respiratory: Goal: Ability to maintain adequate ventilation will improve Outcome: Progressing   Problem: Education: Goal: Knowledge of General Education information will improve Description Including pain rating scale, medication(s)/side effects and non-pharmacologic comfort measures Outcome: Adequate for Discharge   Problem: Health Behavior/Discharge Planning: Goal: Ability to manage health-related needs will improve Outcome: Progressing   Problem: Clinical Measurements: Goal: Ability to maintain clinical measurements within normal limits will improve Outcome: Progressing Goal: Will remain free from infection Outcome: Progressing Goal: Diagnostic test results will improve Outcome: Progressing Goal: Respiratory complications will improve Outcome: Progressing Goal: Cardiovascular complication will be avoided Outcome: Progressing   Problem: Activity: Goal: Risk for activity intolerance will decrease Outcome: Adequate for Discharge   Problem: Nutrition: Goal: Adequate nutrition will be maintained Outcome: Adequate for Discharge   Problem: Coping: Goal: Level of anxiety will decrease Outcome: Progressing   Problem: Elimination: Goal: Will not experience complications related to bowel motility Outcome: Progressing Goal: Will not experience complications related to urinary retention Outcome: Not Applicable   Problem: Pain Managment: Goal: General experience of comfort will improve Outcome: Not Applicable   Problem: Safety: Goal: Ability to remain free from injury will improve Outcome: Adequate for Discharge   Problem: Skin Integrity: Goal: Risk for impaired skin integrity will decrease Outcome:  Progressing   Problem: Education: Goal: Knowledge of disease or condition will improve Outcome: Progressing Goal: Knowledge of secondary prevention will improve Outcome: Progressing

## 2018-10-18 NOTE — Progress Notes (Signed)
Very hard to complete nuero assessment and NIH.  Pt is being difficult and noncompliant.

## 2018-10-18 NOTE — Progress Notes (Signed)
   Subjective:  Patrick Rhodes is a 85 y.o. with PMH of CVA, hypothyroidism, HTN admit for altered mental status on hospital day 5  [name] was examined and evaluated at bedside this AM. He was observed resting comfortably in bed. He has no acute complaints at this time. Speaks fondly of his son. He is able to follow some directions but unable to participate in full neuro exam. He was observed moving side to side in bed.  Objective:  Vital signs in last 24 hours: Vitals:   10/16/18 1434 10/16/18 2236 10/17/18 0607 10/17/18 1446  BP: (!) 163/96 137/81 (!) 145/88 125/66  Pulse: 83 86 81 (!) 48  Resp:  14 16 16   Temp: 97.7 F (36.5 C) 97.7 F (36.5 C) (!) 97.5 F (36.4 C) 98.5 F (36.9 C)  TempSrc:  Oral Oral Oral  SpO2: 97% 97% 100% 100%  Weight:       Gen: Cachetic appearing, not in acute distress HEENT: white mucous noted on left eye CV: RRR, S1, S2 normal Abd: Soft, BS+, NTND, No rebound, no guarding Extm: ROM intact, Peripheral pulses intact, No peripheral edema Skin: Dry, Warm, normal turgor Neuro: AAOx1 to name. Unable to follow directions for full neuro exam.  Assessment/Plan:  Principal Problem:   Cerebellar stroke ([place]) Active Problems:   Sepsis ([place])   Pneumonia of left lower lobe due to infectious organism [facility])   Infectious encephalopathy   Hypercalcemia  Patrick Rhodes is a 85 y.o. with PMH of CVA, Hypothyroidism, HTN and HLD admit for altered mental status. Neurology signed off with recommendation to continue dAPT for 3 months and do 30 day event monitor. Most likely he will be unable to tolerate such monitor. Currently awaiting TTE results.   Leukocytosis, altered mental status 2/2 CAP vs Sinusitis WBC 12.0->8.4->5.5->5.6  - Finishing 5 day course of ceftriaxone today   Acute Left cerebellar PICA territory infarct MRI showing non-hemorrhagic acute left cerebellar infarct as well as superior left frontal meningioma. 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 Carotid dopplers showing no significant stenosis. Currently awaiting TTE for PFO evaluation. Neuro following. PT/OT recommending SNF discharge. Family requesting patient be discharged home. [facility] provider aware and can set up transportation, PT, OT at [facility]. - Appreciate neuro recs - F/u TTE - C/w DAPT w/ aspirin 325mg  daily and clopidogrel 75mg  daily - Can discharge today as long as echo does not show any significant findings.  DVT prophx: Lovenox Diet: Soft diet Bowel: Senokot, Miralax Code: Full  Dispo: Anticipated discharge in approximately 0-1 day(s).   [name], MD 10/18/2018, 6:36 AM Pager: [phone]

## 2018-10-18 NOTE — Plan of Care (Signed)
  Problem: Ischemic Stroke/TIA Tissue Perfusion: Goal: Complications of ischemic stroke/TIA will be minimized Outcome: Progressing   Problem: Nutrition: Goal: Risk of aspiration will decrease Outcome: Progressing   Problem: Self-Care: Goal: Ability to participate in self-care as condition permits will improve Outcome: Adequate for Discharge Goal: Ability to communicate needs accurately will improve Outcome: Progressing   Problem: Health Behavior/Discharge Planning: Goal: Ability to manage health-related needs will improve Outcome: Progressing   Problem: Education: Goal: Knowledge of patient specific risk factors addressed and post discharge goals established will improve Outcome: Adequate for Discharge Goal: Individualized Educational Video(s) Outcome: Progressing

## 2018-10-18 NOTE — Progress Notes (Signed)
  Echocardiogram 2D Echocardiogram has been performed.  Patrick Rhodes 10/18/2018, 1:49 PM

## 2018-10-19 DIAGNOSIS — Z7902 Long term (current) use of antithrombotics/antiplatelets: Secondary | ICD-10-CM

## 2018-10-19 DIAGNOSIS — I712 Thoracic aortic aneurysm, without rupture: Secondary | ICD-10-CM

## 2018-10-19 DIAGNOSIS — F0150 Vascular dementia without behavioral disturbance: Secondary | ICD-10-CM

## 2018-10-19 LAB — BASIC METABOLIC PANEL
Anion gap: 7 (ref 5–15)
BUN: 11 mg/dL (ref 8–23)
CO2: 29 mmol/L (ref 22–32)
Calcium: 10 mg/dL (ref 8.9–10.3)
Chloride: 101 mmol/L (ref 98–111)
Creatinine, Ser: 0.69 mg/dL (ref 0.61–1.24)
GFR calc Af Amer: >60 mL/min (ref >60)
GFR calc non Af Amer: >60 mL/min (ref >60)
Glucose, Bld: 84 mg/dL (ref 70–99)
Potassium: 4.6 mmol/L (ref 3.5–5.1)
Sodium: 137 mmol/L (ref 135–145)

## 2018-10-19 MED ORDER — CLOPIDOGREL BISULFATE 75 MG PO TABS: 75.0000 mg | ORAL_TABLET | Freq: Every day | ORAL | 0 refills | Status: AC

## 2018-10-19 MED ORDER — ATORVASTATIN CALCIUM 20 MG PO TABS: 20.0000 mg | ORAL_TABLET | Freq: Every day | ORAL | 0 refills | Status: DC

## 2018-10-19 MED ORDER — ASPIRIN 325 MG PO TBEC: 325.0000 mg | DELAYED_RELEASE_TABLET | Freq: Every day | ORAL | 0 refills | Status: DC

## 2018-10-19 MED ORDER — ASPIRIN 325 MG PO TBEC: 325.0000 mg | DELAYED_RELEASE_TABLET | Freq: Every day | ORAL | 0 refills | Status: AC

## 2018-10-19 MED ORDER — CLOPIDOGREL BISULFATE 75 MG PO TABS: 75.0000 mg | ORAL_TABLET | Freq: Every day | ORAL | 0 refills | Status: DC

## 2018-10-19 NOTE — Plan of Care (Signed)
  Problem: Health Behavior/Discharge Planning: Goal: Ability to manage health-related needs will improve Outcome: Progressing   Problem: Clinical Measurements: Goal: Ability to maintain clinical measurements within normal limits will improve Outcome: Progressing Goal: Will remain free from infection Outcome: Progressing   Problem: Coping: Goal: Level of anxiety will decrease Outcome: Progressing   Problem: Elimination: Goal: Will not experience complications related to bowel motility Outcome: Progressing   Problem: Skin Integrity: Goal: Risk for impaired skin integrity will decrease Outcome: Progressing   Problem: Education: Goal: Knowledge of General Education information will improve Description Including pain rating scale, medication(s)/side effects and non-pharmacologic comfort measures Outcome: Not Progressing   Problem: Activity: Goal: Risk for activity intolerance will decrease Outcome: Not Progressing   Problem: Nutrition: Goal: Adequate nutrition will be maintained Outcome: Not Progressing   Problem: Safety: Goal: Ability to remain free from injury will improve Outcome: Not Progressing

## 2018-10-19 NOTE — Progress Notes (Signed)
[facility] [phone]) called and informed NCM transportation has been setup and pt will be picked up by [facility]  @ 3pm. NCM made pt aware. Whitman Hero RN,BSN,CM

## 2018-10-19 NOTE — Discharge Summary (Signed)
Name: Patrick Rhodes MRN: [id number] DOB: 1929/08/26 85 y.o. PCP: System, Pcp Not In  Date of Admission: 10/13/2018 12:34 PM Date of Discharge: 10/19/2018 Attending Physician: [name], MD  Discharge Diagnosis: 1. Community Acquired Left Lower Lobe pneumonia 2. Acute Left Cerebellar Infarct 3. Ascending thoracic aortic aneurysm 4. Vascular Dementia  Discharge Medications: Allergies as of 10/19/2018   No Known Allergies     Medication List    TAKE these medications   aspirin 325 MG EC tablet Take 1 tablet (325 mg total) by mouth daily. What changed:    medication strength  how much to take   atorvastatin 20 MG tablet Commonly known as:  LIPITOR Take 1 tablet (20 mg total) by mouth daily at 6 PM.   clopidogrel 75 MG tablet Commonly known as:  PLAVIX Take 1 tablet (75 mg total) by mouth daily.   levothyroxine 75 MCG tablet Commonly known as:  SYNTHROID, LEVOTHROID Take 75 mcg by mouth daily before breakfast.   losartan 100 MG tablet Commonly known as:  COZAAR Take 100 mg by mouth daily.   multivitamin with minerals Tabs tablet Take 1 tablet by mouth daily.       Disposition and follow-up:   Patrick Rhodes was discharged from [facility] in Stable condition.  At the hospital follow up visit please address:  1. Community Acquired Left Lower Lobe pneumonia - No further follow up indicated  2. Acute Left Cerebellar Infarct  - Ensure that he takes aspirin and plavix for 3 months and the plavix alone indefinitely  3. Ascending thoracic aortic aneurysm - He will require annual CTA or MRA chest to f/u progression of aortic aneurysm  4. Vascular Dementia - Ensure he has proper home health support for management of his dementia  2.  Labs / imaging needed at time of follow-up: N/A  3.  Pending labs/ test needing follow-up: N/A  Follow-up Appointments: Follow-up Information    [facility]. Schedule an  appointment as soon as possible for a visit in 4 week(s).   Specialty:  Neurology Contact information: [address] [place] Hospital Course by problem list: 1. Community Acquired Left Lower Lobe pneumonia: Patrick Rhodes is a 85 yo M w/ PMH of CVA, Hypothyroidism and HTN presenting with sinus congestion with fatigue and weakness as well as altered mental status. He was initially thought to have sinusitis per primary care physician but his symptoms worsened and he was brought to the ED for evaluation. Chest X-ray showed left lower lobe opacity concerning for consolidation. Started initially on vancomycin and cefepime. Altered mental status did not improve. CT head showed air fluid level in right maxillary sinus but more importantly acute cerebellar stroke. AMS more associated with stroke rather than infection. Antibiotics narrowed down to ceftriaxone and completed 5 day course of to cover for sinusitis and pneumonia. Discharged with recommendation to f/u with PCP.  2. Acute Left Cerebellar Infarct: Presented with altered mental status with difficulty with orientation and gait instability. Initially thought to be infectious etiology due to Chest X-ray findings, but CT head showed nonhemorrhagic inferior left cerebellar infarct. MRI confirmed acute stroke. Started on DAPT with Plavix and aspirin. CTA head and neck showed occluded left vertebral artery with reconstitution at V4 segment. TTE showed no PFO. Discharged with recommendation to continue DAPT for 3 months and follow up with neurology.  3. Ascending thoracic aortic aneurysm:  Found to have dilated aortic sinus with pericardial effusion during TTE to evaluate for PFO. Followed up with CTA chest showing 4.3cm ascending thoracic aortic aneurysm. Discharged with recommendation to have annual CTA or MRA for follow up.  4. Vascular Dementia: Presented with worsening mentation despite  antibiotic treatment for presumed infectious etiology. Found to have left cerebellar infarct. Mentation did not return to baseline. Conversation with son revealed worsening mentation throughout the years exacerbated by multiple CVA events. Family requested home health instead of SNF placement to reduce risk of delirium with new environment. Discharged with recommendation to close f/u with home health.   Discharge Vitals:   BP (!) 159/95   Pulse 91   Temp 98.4 F (36.9 C)   Resp 19   Wt 59 kg   SpO2 100%   Pertinent Labs, Studies, and Procedures:  CBC Latest Ref Rng & Units 10/18/2018 10/17/2018 10/16/2018  WBC 4.0 - 10.5 K/uL 5.6 5.5 8.4  Hemoglobin 13.0 - 17.0 g/dL 10.3(L) 9.7(L) 10.3(L)  Hematocrit 39.0 - 52.0 % 31.8(L) 30.4(L) 32.3(L)  Platelets 150 - 400 K/uL 353 237 346   Chest X-ray 2 View IMPRESSION: 1. Left lower lobe atelectasis versus peribronchial airspace consolidation. 2. Enlarged cardiac silhouette.  CT HEAD WO CONTRAST IMPRESSION: 1. Suspect acute/subacute nonhemorrhagic inferior left cerebellar infarct. 2. New from prior exam although appearing remote is an infarct involving the mid aspect of the left cerebellum. 3. No intracranial hemorrhage. 4. Remote infarct left corona radiata, posterior limb left internal capsule and right lenticular nucleus. Prominent chronic microvascular changes. 5. Global atrophy. 6. Bubbly opacification right maxillary sinus with air-fluid level raises possibly of acute sinusitis. 7. Partial opacification right mastoid air cells and middle ear cavity. No obstructing lesion of eustachian tube noted.  MR BRAIN W W WO CONTRAST IMPRESSION: 1. Nonhemorrhagic acute infarct of the left cerebellum PICA territory. Mild edema without mass effect. 2. Abnormal left vertebral artery flow void may indicate stenosis/reduced flow or dissection in the context of left PICA territory infarct. MRA of the neck with without contrast might  be helpful. 3. Advanced chronic microvascular ischemia and generalized atrophy. 4. Superior left frontal convexity meningioma without abnormality of the underlying brain parenchyma.  CTA HEAD NECK W OR WO CONTRAST IMPRESSION: CT HEAD:  1. Evolving LEFT cerebellar/PICA territory nonhemorrhagic infarct. 2. Old basal ganglia, thalami and LEFT cerebellar infarcts. 3. Moderate chronic small vessel ischemic changes. 4. LEFT frontal meningioma better demonstrated on the today's MRI head.  CTA NECK:  1. Occluded LEFT vertebral artery without reconstitution. 2. No hemodynamically significant stenosis ICA.  CTA HEAD:  1. No emergent large vessel occlusion; reconstitution LEFT vertebral artery at V4 segment, patent PICA. 2. Severe stenoses LEFT MCA, LEFT PCA.  TEE IMPRESSIONS  1. The left ventricle has normal systolic function with an ejection fraction of 60-65%. The cavity size was normal. There is moderately increased left ventricular wall thickness. Left ventricular diastolic Doppler parameters are consistent with impaired  relaxation.  2. The right ventricle has normal systolic function. The cavity was normal. There is no increase in right ventricular wall thickness.  3. Small pericardial effusion.  4. The mitral valve is degenerative. Mild thickening of the mitral valve leaflet. Mild calcification of the mitral valve leaflet.  5. The tricuspid valve is normal in structure. Tricuspid valve regurgitation is mild-moderate.  6. The aortic valve was not well visualized Mild thickening of the aortic valve Aortic valve regurgitation is mild by color flow Doppler.  7. The pulmonic valve  was grossly normal. Pulmonic valve regurgitation is mild by color flow Doppler.  8. The aortic sinus is dilated with ? shadowing artifact Distal aortic root alos appears dilated With AR and small pericardial effusion would f/u with CTA to r/o aortic dissection Also note ? dilated descending thoracic aorta  posterior to the LA.  CTA CHEST AORTA W CONTRAST IMPRESSION: Cardiomegaly. Ascending thoracic aortic aneurysm, 4.3 cm maximally. Recommend annual imaging followup by CTA or MRA. This recommendation follows 2010 ACCF/AHA/AATS/ACR/ASA/SCA/SCAI/SIR/STS/SVM Guidelines for the Diagnosis and Management of Patients with Thoracic Aortic Disease. Circulation. 2010; 121: Z660-Y301. Aortic aneurysm NOS (ICD10-I71.9)  Small pericardial effusion and left pleural effusion.  Dependent atelectasis.   Discharge Instructions: Patrick Rhodes  You came to [place] with confusion. We have determined this was caused by a stroke. Here are our recommendations for you at discharge:  - Please take aspirin 325mg  and clopidogrel 75mg  for 3 months and then STOP clopidogrel and take aspirin alone - Please take atorvastatin 20mg  daily - Take all of your other home medications as prescribed - You will need repeat CT chest in 12 months - We are recommending setting up 30 day event monitor to evaluate for arrhythmia although he is currently endorsing altered mental status with poor adherence expected  Thank you for choosing [facility].   Discharge Instructions    Ambulatory referral to Neurology   Complete by:  As directed    Follow up with stroke clinic NP (Jessica Vanschaick or Cecille Rubin, if both not available, consider Zachery Dauer, or Ahern) at [facility] in about 4 weeks. Thanks.   Call MD for:  difficulty breathing, headache or visual disturbances   Complete by:  As directed    Call MD for:  persistant dizziness or light-headedness   Complete by:  As directed    Call MD for:  persistant nausea and vomiting   Complete by:  As directed    Call MD for:  redness, tenderness, or signs of infection (pain, swelling, redness, odor or green/yellow discharge around incision site)   Complete by:  As directed    Call MD for:  temperature >100.4   Complete by:  As directed    Diet - low sodium heart healthy    Complete by:  As directed    Increase activity slowly   Complete by:  As directed       Signed: [name], MD 10/22/2018, 10:21 PM   Pager: [phone]

## 2018-10-19 NOTE — Discharge Instructions (Signed)
Dear Patrick Rhodes  You came to [place] with confusion. We have determined this was caused by a stroke. Here are our recommendations for you at discharge:  - Please take aspirin 325mg  and clopidogrel 75mg  for 3 months and then STOP clopidogrel and take aspirin alone - Please take atorvastatin 20mg  daily - Take all of your other home medications as prescribed - You will need repeat CT chest in 12 months - We are recommending setting up 30 day event monitor to evaluate for arrhythmia although he is currently endorsing altered mental status with poor adherence expected  Thank you for choosing [facility].   Stroke Prevention Some medical conditions and lifestyle choices can lead to a higher risk for a stroke. You can help to prevent a stroke by making nutrition, lifestyle, and other changes. What nutrition changes can be made?   Eat healthy foods. ? Choose foods that are high in fiber. These include:  Fresh fruits.  Fresh vegetables.  Whole grains. ? Eat at least 5 or more servings of fruits and vegetables each day. Try to fill half of your plate at each meal with fruits and vegetables. ? Choose lean protein foods. These include:  Lowfat (lean) cuts of meat.  Chicken without skin.  Fish.  Tofu.  Beans.  Nuts. ? Eat low-fat dairy products. ? Avoid foods that:  Are high in salt (sodium).  Have saturated fat.  Have trans fat.  Have cholesterol.  Are processed.  Are premade.  Follow eating guidelines as told by your doctor. These may include: ? Reducing how many calories you eat and drink each day. ? Limiting how much salt you eat or drink each day to 1,500 milligrams (mg). ? Using only healthy fats for cooking. These include:  Olive oil.  Canola oil.  Sunflower oil. ? Counting how many carbohydrates you eat and drink each day. What lifestyle changes can be made?  Try to stay at a healthy weight. Talk to your doctor about what a good weight is for  you.  Get at least 30 minutes of moderate physical activity at least 5 days a week. This can include: ? Fast walking. ? Biking. ? Swimming.  Do not use any products that have nicotine or tobacco. This includes cigarettes and e-cigarettes. If you need help quitting, ask your doctor. Avoid being around tobacco smoke in general.  Limit how much alcohol you drink to no more than 1 drink a day for nonpregnant women and 2 drinks a day for men. One drink equals 12 oz of beer, 5 oz of wine, or 1 oz of hard liquor.  Do not use drugs.  Avoid taking birth control pills. Talk to your doctor about the risks of taking birth control pills if: ? You are over 66 years old. ? You smoke. ? You get migraines. ? You have had a blood clot. What other changes can be made?  Manage your cholesterol. ? It is important to eat a healthy diet. ? If your cholesterol cannot be managed through your diet, you may also need to take medicines. Take medicines as told by your doctor.  Manage your diabetes. ? It is important to eat a healthy diet and to exercise regularly. ? If your blood sugar cannot be managed through diet and exercise, you may need to take medicines. Take medicines as told by your doctor.  Control your high blood pressure (hypertension). ? Try to keep your blood pressure below 130/80. This can help lower your risk  of stroke. ? It is important to eat a healthy diet and to exercise regularly. ? If your blood pressure cannot be managed through diet and exercise, you may need to take medicines. Take medicines as told by your doctor. ? Ask your doctor if you should check your blood pressure at home. ? Have your blood pressure checked every year. Do this even if your blood pressure is normal.  Talk to your doctor about getting checked for a sleep disorder. Signs of this can include: ? Snoring a lot. ? Feeling very tired.  Take over-the-counter and prescription medicines only as told by your doctor.  These may include aspirin or blood thinners (antiplatelets or anticoagulants).  Make sure that any other medical conditions you have are managed. Where to find more information  American Stroke Association: www.strokeassociation.org  National Stroke Association: www.stroke.org Get help right away if:  You have any symptoms of stroke. "BE FAST" is an easy way to remember the main warning signs: ? B - Balance. Signs are dizziness, sudden trouble walking, or loss of balance. ? E - Eyes. Signs are trouble seeing or a sudden change in how you see. ? F - Face. Signs are sudden weakness or loss of feeling of the face, or the face or eyelid drooping on one side. ? A - Arms. Signs are weakness or loss of feeling in an arm. This happens suddenly and usually on one side of the body. ? S - Speech. Signs are sudden trouble speaking, slurred speech, or trouble understanding what people say. ? T - Time. Time to call emergency services. Write down what time symptoms started.  You have other signs of stroke, such as: ? A sudden, very bad headache with no known cause. ? Feeling sick to your stomach (nausea). ? Throwing up (vomiting). ? Jerky movements you cannot control (seizure). These symptoms may represent a serious problem that is an emergency. Do not wait to see if the symptoms will go away. Get medical help right away. Call your local emergency services (911 in the [place] Do not drive yourself to the hospital. Summary  You can prevent a stroke by eating healthy, exercising, not smoking, drinking less alcohol, and treating other health problems, such as diabetes, high blood pressure, or high cholesterol.  Do not use any products that contain nicotine or tobacco, such as cigarettes and e-cigarettes.  Get help right away if you have any signs or symptoms of a stroke. This information is not intended to replace advice given to you by your health care provider. Make sure you discuss any questions you  have with your health care provider. Document Released: 02/07/2012 Document Revised: 11/09/2016 Document Reviewed: 11/09/2016 Elsevier Interactive Patient Education  2019 [employer].

## 2018-10-19 NOTE — Progress Notes (Addendum)
Patrick Rhodes to be D/C'd home via [facility] per MD order. Discussed with the patient's son and all questions fully answered. VVS, Skin clean, dry and intact without evidence of skin break down, no evidence of skin tears noted. Copy of discharge paperwork given to son and [facility] transport. Stroke information book sent home with son. IV catheters discontinued intact. Site without signs and symptoms of complications. Dressing and pressure applied.  Patient escorted via private WC, and D/C home via [facility].  [name]  10/19/2018 4:00 PM

## 2018-10-19 NOTE — Progress Notes (Signed)
   Subjective:  Patrick Rhodes is a 85 y.o. with PMH of CVA, hypothyroidism, HTN admit for altered mental status on hospital day 6  Patrick Rhodes was examined and evaluated at bedside this AM. He was observed resting comfortably in bed. He states he feels fine although he was unhappy to see me. Repeatedly states he has been here for the whole week and would like to go home.   Objective:  Vital signs in last 24 hours: Vitals:   10/18/18 1248 10/18/18 2122 10/18/18 2206 10/19/18 0532  BP: 130/68 (!) 181/101 (!) 169/95 (!) 159/95  Pulse: 88 83 80 91  Resp: 18 19    Temp: 98.9 F (37.2 C) 97.8 F (36.6 C)  98.4 F (36.9 C)  TempSrc: Oral Oral    SpO2: 96% 90%  100%  Weight:       Gen: Cachetic appearing, not in acute distress HEENT: white mucous noted on left eye CV: RRR, S1, S2 normal Abd: Soft, BS+, NTND, No rebound, no guarding Extm: ROM intact, Peripheral pulses intact, No peripheral edema Skin: Dry, Warm, normal turgor Neuro: AAOx1 to name. Unable to follow directions for full neuro exam.  Assessment/Plan:  Principal Problem:   Cerebellar stroke ([place]) Active Problems:   Sepsis ([place])   Pneumonia of left lower lobe due to infectious organism [facility])   Infectious encephalopathy   Hypercalcemia  Patrick Rhodes is a 85 y.o. with PMH of CVA, Hypothyroidism, HTN and HLD admit for altered mental status. TTE yesterday with concerns for aortic dissection. CTA showed thoracic aortic aneurysm. Discharge today w/ close f/u with [facility].  Thoracic aortic aneurysm Incidental finding of dilated aortic root during TTE. F/u CTA shows 4.3cm thoracic aortic aneurysm. - F/u outpatient with repeat imaging in 12 months.  Acute Left cerebellar PICA territory infarct Stoke work up complete and neuro signed off.  - C/w DAPT w/ aspirin 325mg  daily and clopidogrel 75mg  daily - Discharge today.  DVT prophx: Lovenox Diet: Soft diet Bowel: Senokot, Miralax Code: Full  Dispo: Anticipated  discharge in approximately today(s).   [name], MD 10/19/2018, 12:11 PM Pager: [phone]

## 2018-10-19 NOTE — Progress Notes (Signed)
Occupational Therapy Treatment Patient Details Name: Patrick Rhodes MRN: [id number] DOB: [birth year] Today's Date: 10/19/2018    History of present illness [name] is an 85 year old man with a history of prior cerebrovascular accident, hypothyroidism, and hypertension who lives with his son and attends the [facility] 5 days a week.  He generally is ambulatory and independent but over the last week his son noted increasing fatigue and weakness Confusion, urinary incontinence, decreased oral intake. Found to have Nonhemorrhagic acute infarct of the left cerebellum PICA territory, sepsis, PNA   OT comments  This 85 yo male very confused this AM with needing max A to attempt redirection. He was not this way on eval nor was he this way with PT yesterday. RN in room and aware of pt's behavior. We will continue to follow.   Follow Up Recommendations  SNF;Supervision/Assistance - 24 hour    Equipment Recommendations  Other (comment)(may need 3n1 if going home and does not have one)       Precautions / Restrictions Precautions Precautions: Fall Restrictions Weight Bearing Restrictions: No       Mobility Bed Mobility               General bed mobility comments: total A to scoot up in bed         ADL either performed or assessed with clinical judgement   ADL                                         General ADL Comments: total A due to confusion     Vision   Additional Comments: Could state for me to get out of the way that he was watching the weather on the TV (that was what was on)          Cognition Arousal/Alertness: Awake/alert Behavior During Therapy: Agitated;Restless Overall Cognitive Status: (History of cognitive impairments)                                 General Comments: Pt pulling at gown when I entered as well as uncovered with LB exposed. Very difficult to redirect him from pulling at gown and telemetry wires. He was  determined to get gown off then he was pulling at foley. Once he got gown off he said he wanted to eat, but when I asked if I could set him up to seat he said not now. Called RN who brought in mitts and these were applied as well as gown put back on. Pt only oriented to self. Pt did finally agree to take washcloth from me to wash face, but did not wash face but rather layed washcloth on bed rail                   Pertinent Vitals/ Pain       Pain Assessment: Faces Faces Pain Scale: No hurt         Frequency  Min 2X/week        Progress Toward Goals  OT Goals(current goals can now be found in the care plan section)  Progress towards OT goals: Not progressing toward goals - comment     Plan Discharge plan remains appropriate       AM-PAC OT "6 Clicks" Daily Activity     Outcome Measure   Help  from another person eating meals?: Total Help from another person taking care of personal grooming?: Total Help from another person toileting, which includes using toliet, bedpan, or urinal?: Total Help from another person bathing (including washing, rinsing, drying)?: Total Help from another person to put on and taking off regular upper body clothing?: Total Help from another person to put on and taking off regular lower body clothing?: Total 6 Click Score: 6    End of Session    OT Visit Diagnosis: Unsteadiness on feet (R26.81);Other abnormalities of gait and mobility (R26.89);Muscle weakness (generalized) (M62.81);Other symptoms and signs involving cognitive function   Activity Tolerance Treatment limited secondary to agitation   Patient Left in bed;with call bell/phone within reach;with bed alarm set;with restraints reapplied(Bil Mitts)   Nurse Communication (need for bilateral mitts so pt would leave gown on and not pull at foley catheter--she came in to A)        Time: 3299-2426 OT Time Calculation (min): 11 min  Charges: OT General Charges $OT Visit: 1 Visit OT  Treatments $Self Care/Home Management : 8-22 mins  [place] [address], OTR/L Acute [employer] Pager [phone] Office [phone]      Almon Register 10/19/2018, 9:22 AM

## 2018-10-19 NOTE — Care Management Note (Addendum)
Case Management Note  Patient Details  Name: Patrick Rhodes MRN: [id number] Date of Birth: 01/24/1930  Subjective/Objective:    Admitted with AMS. Hx of CVA, hypothyroidism, HTN.           Dayshaun Whobrey [facility])     [phone]      PCP: [facility]  Action/Plan: Transition to home  today with the  resumption of [facility] program.  Per son [facility] is  to provide transportation to [facility] for evaluation once discharged. NCM has called  and left voice message with [facility] SW ANN [phone]) regarding pt's d/c status. Son was made aware also of pt's discharge per NCM. Son stated he is on his way to hospital to be with pt prior to pt leaving with [facility].   Expected Discharge Date:  10/18/18               Expected Discharge Plan:  [place]  In-House Referral:  Clinical Social Work  Discharge planning Services  CM Consult  Post Acute Care Choice:  NA Choice offered to:  NA  DME Arranged:  N/A DME Agency:  NA  HH Arranged:  NA HH Agency:  NA  Status of Service:  Completed, signed off  If discussed at [place] of Stay Meetings, dates discussed:    Additional Comments:  Sharin Mons, RN 10/19/2018, 1:02 PM

## 2018-11-14 ENCOUNTER — Inpatient Hospital Stay: Payer: Medicare (Managed Care) | Admitting: Adult Health

## 2020-04-11 ENCOUNTER — Inpatient Hospital Stay (HOSPITAL_COMMUNITY)
Admission: EM | Admit: 2020-04-11 | Discharge: 2020-04-17 | DRG: 480 | Disposition: A | Discharge status: Home Health | Payer: Medicare (Managed Care) | Attending: Internal Medicine | Admitting: Internal Medicine

## 2020-04-11 ENCOUNTER — Other Ambulatory Visit: Payer: Self-pay

## 2020-04-11 ENCOUNTER — Emergency Department (HOSPITAL_COMMUNITY): Payer: Medicare (Managed Care)

## 2020-04-11 DIAGNOSIS — F0150 Vascular dementia without behavioral disturbance: Secondary | ICD-10-CM | POA: Diagnosis present

## 2020-04-11 DIAGNOSIS — M80851A Other osteoporosis with current pathological fracture, right femur, initial encounter for fracture: Secondary | ICD-10-CM | POA: Diagnosis not present

## 2020-04-11 DIAGNOSIS — Z419 Encounter for procedure for purposes other than remedying health state, unspecified: Secondary | ICD-10-CM

## 2020-04-11 DIAGNOSIS — Z7902 Long term (current) use of antithrombotics/antiplatelets: Secondary | ICD-10-CM

## 2020-04-11 DIAGNOSIS — S72009A Fracture of unspecified part of neck of unspecified femur, initial encounter for closed fracture: Secondary | ICD-10-CM | POA: Diagnosis present

## 2020-04-11 DIAGNOSIS — Z79890 Hormone replacement therapy (postmenopausal): Secondary | ICD-10-CM

## 2020-04-11 DIAGNOSIS — D649 Anemia, unspecified: Secondary | ICD-10-CM | POA: Diagnosis present

## 2020-04-11 DIAGNOSIS — E039 Hypothyroidism, unspecified: Secondary | ICD-10-CM | POA: Diagnosis present

## 2020-04-11 DIAGNOSIS — I471 Supraventricular tachycardia: Secondary | ICD-10-CM | POA: Diagnosis not present

## 2020-04-11 DIAGNOSIS — I493 Ventricular premature depolarization: Secondary | ICD-10-CM | POA: Diagnosis not present

## 2020-04-11 DIAGNOSIS — I69398 Other sequelae of cerebral infarction: Secondary | ICD-10-CM

## 2020-04-11 DIAGNOSIS — U071 COVID-19: Secondary | ICD-10-CM | POA: Diagnosis present

## 2020-04-11 DIAGNOSIS — Z8249 Family history of ischemic heart disease and other diseases of the circulatory system: Secondary | ICD-10-CM

## 2020-04-11 DIAGNOSIS — Z79899 Other long term (current) drug therapy: Secondary | ICD-10-CM

## 2020-04-11 DIAGNOSIS — W010XXA Fall on same level from slipping, tripping and stumbling without subsequent striking against object, initial encounter: Secondary | ICD-10-CM | POA: Diagnosis present

## 2020-04-11 DIAGNOSIS — E860 Dehydration: Secondary | ICD-10-CM | POA: Diagnosis present

## 2020-04-11 DIAGNOSIS — S72141A Displaced intertrochanteric fracture of right femur, initial encounter for closed fracture: Secondary | ICD-10-CM

## 2020-04-11 DIAGNOSIS — N179 Acute kidney failure, unspecified: Secondary | ICD-10-CM | POA: Diagnosis present

## 2020-04-11 DIAGNOSIS — I10 Essential (primary) hypertension: Secondary | ICD-10-CM | POA: Diagnosis present

## 2020-04-11 DIAGNOSIS — F0280 Dementia in other diseases classified elsewhere without behavioral disturbance: Secondary | ICD-10-CM | POA: Diagnosis present

## 2020-04-11 DIAGNOSIS — E7800 Pure hypercholesterolemia, unspecified: Secondary | ICD-10-CM | POA: Diagnosis present

## 2020-04-11 LAB — BASIC METABOLIC PANEL
Anion gap: 12 (ref 5–15)
BUN: 29 mg/dL — ABNORMAL HIGH (ref 8–23)
CO2: 20 mmol/L — ABNORMAL LOW (ref 22–32)
Calcium: 10.2 mg/dL (ref 8.9–10.3)
Chloride: 104 mmol/L (ref 98–111)
Creatinine, Ser: 1.35 mg/dL — ABNORMAL HIGH (ref 0.61–1.24)
GFR calc Af Amer: 54 mL/min — ABNORMAL LOW (ref >60)
GFR calc non Af Amer: 47 mL/min — ABNORMAL LOW (ref >60)
Glucose, Bld: 284 mg/dL — ABNORMAL HIGH (ref 70–99)
Potassium: 4 mmol/L (ref 3.5–5.1)
Sodium: 136 mmol/L (ref 135–145)

## 2020-04-11 LAB — CBC WITH DIFFERENTIAL/PLATELET
Abs Immature Granulocytes: 0.04 10*3/uL (ref 0.00–0.07)
Basophils Absolute: 0 10*3/uL (ref 0.0–0.1)
Basophils Relative: 0 %
Eosinophils Absolute: 0 10*3/uL (ref 0.0–0.5)
Eosinophils Relative: 0 %
HCT: 27.2 % — ABNORMAL LOW (ref 39.0–52.0)
Hemoglobin: 9 g/dL — ABNORMAL LOW (ref 13.0–17.0)
Immature Granulocytes: 0 %
Lymphocytes Relative: 4 %
Lymphs Abs: 0.4 10*3/uL — ABNORMAL LOW (ref 0.7–4.0)
MCH: 29.3 pg (ref 26.0–34.0)
MCHC: 33.1 g/dL (ref 30.0–36.0)
MCV: 88.6 fL (ref 80.0–100.0)
Monocytes Absolute: 0.6 10*3/uL (ref 0.1–1.0)
Monocytes Relative: 6 %
Neutro Abs: 9.1 10*3/uL — ABNORMAL HIGH (ref 1.7–7.7)
Neutrophils Relative %: 90 %
Platelets: 230 10*3/uL (ref 150–400)
RBC: 3.07 MIL/uL — ABNORMAL LOW (ref 4.22–5.81)
RDW: 12.5 % (ref 11.5–15.5)
WBC: 10.1 10*3/uL (ref 4.0–10.5)
nRBC: 0 % (ref 0.0–0.2)

## 2020-04-11 LAB — PROTIME-INR
INR: 1.1 (ref 0.8–1.2)
Prothrombin Time: 13.3 seconds (ref 11.4–15.2)

## 2020-04-11 LAB — SARS CORONAVIRUS 2 BY RT PCR (HOSPITAL ORDER, PERFORMED IN ~~LOC~~ HOSPITAL LAB): SARS Coronavirus 2: POSITIVE — AB

## 2020-04-11 MED ORDER — FENTANYL CITRATE (PF) 100 MCG/2ML IJ SOLN: 25.0000 ug | INTRAMUSCULAR | Status: DC | PRN

## 2020-04-11 MED ORDER — ONDANSETRON HCL 4 MG/2ML IJ SOLN
4.0000 mg | Freq: Once | INTRAMUSCULAR | Status: AC
  Administered 2020-04-11: 4 mg via INTRAVENOUS
  Filled 2020-04-11: qty 2

## 2020-04-11 MED ORDER — FENTANYL CITRATE (PF) 100 MCG/2ML IJ SOLN
25.0000 ug | Freq: Once | INTRAMUSCULAR | Status: AC
  Administered 2020-04-11: 25 ug via INTRAVENOUS
  Filled 2020-04-11: qty 2

## 2020-04-11 MED ORDER — SODIUM CHLORIDE 0.9 % IV SOLN: INTRAVENOUS | Status: AC

## 2020-04-11 NOTE — ED Notes (Signed)
[phone] Patrick Rhodes son

## 2020-04-11 NOTE — ED Triage Notes (Signed)
Pt at home with family and fell while getting out of the chair. No LOC. Unaware if hit head. Family got pt off of the floor and placed him in bed. Noticed swelling to right thigh 2 hours later and called EMS. Pt is on Plavix. Has left deficit due to past stroke.

## 2020-04-11 NOTE — ED Provider Notes (Addendum)
[facility] EMERGENCY DEPARTMENT Provider Note   CSN: [id number] Arrival date & time: 04/11/20  2109     History No chief complaint on file.   Patrick Rhodes is a 86 y.o. male.  HPI     Past Medical History:  Diagnosis Date  . High cholesterol   . Hypertension   . Hypothyroidism   . Stroke [facility])     Patrick Rhodes Active Problem List   Diagnosis Date Noted  . Cerebellar stroke ([place]) 10/15/2018  . Pneumonia of left lower lobe due to infectious organism   . Infectious encephalopathy   . Hypercalcemia   . Sepsis ([place]) 10/13/2018    No past surgical history on file.     No family history on file.  Social History   Tobacco Use  . Smoking status: Never Smoker  . Smokeless tobacco: Never Used  Substance Use Topics  . Alcohol use: No  . Drug use: No    Home Medications Prior to Admission medications   Medication Sig Start Date End Date Taking? Authorizing Provider  atorvastatin (LIPITOR) 20 MG tablet Take 1 tablet (20 mg total) by mouth daily at 6 PM. 10/19/18   [name], MD  levothyroxine (SYNTHROID, LEVOTHROID) 75 MCG tablet Take 75 mcg by mouth daily before breakfast.    [provider]  losartan (COZAAR) 100 MG tablet Take 100 mg by mouth daily.    [provider]  Multiple Vitamin (MULTIVITAMIN WITH MINERALS) TABS tablet Take 1 tablet by mouth daily.    [provider]    Allergies    Patrick Rhodes has no known allergies.  Review of Systems   Review of Systems  Unable to perform ROS: Dementia    Physical Exam Updated Vital Signs BP 108/77 (BP Location: Left Arm)   Pulse (!) 104   Temp 98.6 F (37 C) (Oral)   Resp (!) 21   SpO2 100%   Physical Exam Vitals and nursing note reviewed.  Constitutional:      General: Patrick Rhodes is not in acute distress.    Appearance: Patrick Rhodes is well-developed.  HENT:     Head: Normocephalic and atraumatic.  Eyes:     Conjunctiva/sclera: Conjunctivae normal.     Pupils: Pupils are  equal, round, and reactive to light.  Cardiovascular:     Rate and Rhythm: Tachycardia present. Rhythm irregular.     Pulses: Normal pulses.     Heart sounds: No murmur heard.   Pulmonary:     Effort: Pulmonary effort is normal. No respiratory distress.     Breath sounds: Normal breath sounds. No wheezing or rales.  Abdominal:     General: There is no distension.     Palpations: Abdomen is soft.     Tenderness: There is no abdominal tenderness. There is no guarding or rebound.  Musculoskeletal:        General: Tenderness present.     Cervical back: Normal range of motion and neck supple.     Right hip: Deformity, tenderness and bony tenderness present. Decreased range of motion.     Left hip: Normal.       Legs:  Skin:    General: Skin is warm and dry.     Findings: No erythema or rash.  Neurological:     Mental Status: Patrick Rhodes is alert and oriented to person, place, and time.  Psychiatric:        Behavior: Behavior normal.     ED Results / Procedures /  Treatments   Labs (all labs ordered are listed, but only abnormal results are displayed) Labs Reviewed  SARS CORONAVIRUS 2 BY RT PCR ([facility]) - Abnormal; Notable for the following components:      Result Value   SARS Coronavirus 2 POSITIVE (*)    All other components within normal limits  BASIC METABOLIC PANEL - Abnormal; Notable for the following components:   CO2 20 (*)    Glucose, Bld 284 (*)    BUN 29 (*)    Creatinine, Ser 1.35 (*)    GFR calc non Af Amer 47 (*)    GFR calc Af Amer 54 (*)    All other components within normal limits  CBC WITH DIFFERENTIAL/PLATELET - Abnormal; Notable for the following components:   RBC 3.07 (*)    Hemoglobin 9.0 (*)    HCT 27.2 (*)    Neutro Abs 9.1 (*)    Lymphs Abs 0.4 (*)    All other components within normal limits  PROTIME-INR  TYPE AND SCREEN  ABO/RH    EKG EKG Interpretation  Date/Time:  Saturday April 11 2020 21:19:26  EDT Ventricular Rate:  97 PR Interval:    QRS Duration: 114 QT Interval:  338 QTC Calculation: 430 R Axis:   63 Text Interpretation: Sinus tachycardia Ventricular premature complex Borderline intraventricular conduction delay new Nonspecific T abnormalities, lateral leads Artifact in lead(s) I II aVR aVL aVF V1 Confirmed by Blanchie Dessert [phone]) on 04/11/2020 9:45:04 PM   Radiology DG Hip Unilat  With Pelvis 2-3 Views Right  Result Date: 04/11/2020 CLINICAL DATA:  Fall EXAM: DG HIP (WITH OR WITHOUT PELVIS) 2-3V RIGHT COMPARISON:  None. FINDINGS: The osseous structures appear diffusely demineralized which may limit detection of small or nondisplaced fractures. There is a comminuted obliquely oriented inter trochanteric right femur fracture with slight foreshortening, external rotation and valgus angulation across the fracture line. Proximal femoral component including the articular surface of the femoral head, remains in normal articulation with the right acetabulum. Remaining bones of the pelvis are intact and congruent albeit with suboptimal assessment of the sacrum due to extensive overlying bowel gas and bowel contents. Degenerative changes noted throughout the lower lumbar spine, hips and pelvis. Large bony excrescence along the left iliac crest may be degenerative or enthesopathic in nature possibly related to the gluteal musculature. Atherosclerotic calcifications in the soft tissues. IMPRESSION: 1. Comminuted, obliquely oriented intertrochanteric right femur fracture with slight foreshortening, external rotation and valgus angulation across the fracture line. Femoral head remains normally located. 2. The osseous structures appear diffusely demineralized. 3. No other discernible acute osseous injuries. 4. Large bony excrescence along the left iliac crest, indeterminate but possibly enthesopathic, related to the gluteal musculature. Electronically Signed   By: Lovena Le M.D.   On: 04/11/2020  22:10    Procedures Procedures (including critical care time)  Medications Ordered in ED Medications  0.9 %  sodium chloride infusion (has no administration in time range)  fentaNYL (SUBLIMAZE) injection 25 mcg (has no administration in time range)  fentaNYL (SUBLIMAZE) injection 25 mcg (25 mcg Intravenous Given 04/11/20 2132)  ondansetron (ZOFRAN) injection 4 mg (4 mg Intravenous Given 04/11/20 2130)    ED Course  I have reviewed the triage vital signs and the nursing notes.  Pertinent labs & imaging results that were available during my care of the Patrick Rhodes were reviewed by me and considered in my medical decision making (see chart for details).  MDM Rules/Calculators/A&P                          Elderly gentleman presenting today after a fall at home.  Son reports that Patrick Rhodes was getting off of Patrick bedside potty and leaned over to pull Patrick pants up and fell on Patrick right side.  Patrick Rhodes initially was put back in bed but was having significant right-sided hip pain.  Patrick Rhodes had no loss of consciousness and is at Patrick baseline mental status.  Patrick Rhodes on exam has deformity and pain of the right hip.  Patrick Rhodes is neurovascularly intact at this time.  BMP shows a creatinine of 1.35 elevated from 0.891-year ago, CBC with chronic anemia of 9.0 but no significant changes.  Coags are within normal limits but Patrick Rhodes does take Plavix.  Hip films show a comminuted obliquely oriented intertrochanteric right femur fracture with slight foreshortening external rotation and valgus angulation with femoral head intact.  Patrick Rhodes will need admission for repair of Patrick hip.  Will consult hospitalist and orthopedics for further care.  Spoke with Patrick Rhodes who is Patrick power of attorney and helps make decisions for Patrick Rhodes.  Patrick Rhodes however per report from son is ambulatory at home and very rarely has to use a cane and has never had a fall prior to this.  Patrick Rhodes states Patrick Rhodes would definitely want the surgery done.  EKG did show tachycardia  unchanged from prior EKG. Pt is also COVID positive.  11:23 PM Spoke with Dr. Lyla Glassing who has ordered a CT and further imaging of the Patrick Rhodes's hip due to the severity of fracture.  Patrick Rhodes also requested that Patrick Rhodes get a unit of blood prior to surgery tomorrow.  MDM Number of Diagnoses or Management Options   Amount and/or Complexity of Data Reviewed Clinical lab tests: ordered and reviewed Tests in the radiology section of CPT: ordered and reviewed Tests in the medicine section of CPT: ordered and reviewed Decide to obtain previous medical records or to obtain history from someone other than the Patrick Rhodes: yes Obtain history from someone other than the Patrick Rhodes: yes Review and summarize past medical records: yes Discuss the Patrick Rhodes with other providers: yes Independent visualization of images, tracings, or specimens: yes  Risk of Complications, Morbidity, and/or Mortality Presenting problems: moderate Diagnostic procedures: low Management options: moderate  Patrick Rhodes Progress Patrick Rhodes progress: improved  Patrick Rhodes was evaluated in Emergency Department on 04/11/2020 for the symptoms described in the history of present illness. Patrick Rhodes was evaluated in the context of the global COVID-19 pandemic, which necessitated consideration that the Patrick Rhodes might be at risk for infection with the SARS-CoV-2 virus that causes COVID-19. Institutional protocols and algorithms that pertain to the evaluation of patients at risk for COVID-19 are in a state of rapid change based on information released by regulatory bodies including the CDC and federal and state organizations. These policies and algorithms were followed during the Patrick Rhodes's care in the ED.   Final Clinical Impression(s) / ED Diagnoses Final diagnoses:  Displaced intertrochanteric fracture of right femur, initial encounter for closed fracture [facility])  COVID-19    Rx / DC Orders ED Discharge Orders    None       Blanchie Dessert,  MD 04/11/20 2300    Blanchie Dessert, MD 04/11/20 2329

## 2020-04-11 NOTE — Hospital Course (Addendum)
Admitted 04/11/2020  Allergies: Patient has no known allergies. Pertinent Hx: HTN, HLD, CVA, vascular dementia, hypothyroidism  87 y.o. male p/w mechanical fall  * Right femur fracture: Mechanical fall, noted to have right hip pain. Unwitnessed, getting CT head. Hip X-ray showed comminuted obliquely oriented intertrochanteric right femur fracture. Mildly anemic at 9, ortho wanting unit of blood prior to surgery. Orthopedics getting additional imaging, planning for OR in AM.   *COVID +: Noted on labs, no shortness of breath, cough, fever, chills. Monitoring.   *AKI: Cr 1.35, up from 0.89 1 year ago. Giving fluids, repeating in AM.   Consults: Orthopedics  Meds: Fentanyl VTE ppx: SCDs IVF: NS Diet: NPO

## 2020-04-12 ENCOUNTER — Inpatient Hospital Stay (HOSPITAL_COMMUNITY): Payer: Medicare (Managed Care)

## 2020-04-12 DIAGNOSIS — Z7902 Long term (current) use of antithrombotics/antiplatelets: Secondary | ICD-10-CM | POA: Diagnosis not present

## 2020-04-12 DIAGNOSIS — Z8249 Family history of ischemic heart disease and other diseases of the circulatory system: Secondary | ICD-10-CM | POA: Diagnosis not present

## 2020-04-12 DIAGNOSIS — S72009A Fracture of unspecified part of neck of unspecified femur, initial encounter for closed fracture: Secondary | ICD-10-CM | POA: Diagnosis present

## 2020-04-12 DIAGNOSIS — M80851A Other osteoporosis with current pathological fracture, right femur, initial encounter for fracture: Secondary | ICD-10-CM | POA: Diagnosis present

## 2020-04-12 DIAGNOSIS — U071 COVID-19: Secondary | ICD-10-CM | POA: Diagnosis present

## 2020-04-12 DIAGNOSIS — I493 Ventricular premature depolarization: Secondary | ICD-10-CM | POA: Diagnosis not present

## 2020-04-12 DIAGNOSIS — E7800 Pure hypercholesterolemia, unspecified: Secondary | ICD-10-CM | POA: Diagnosis present

## 2020-04-12 DIAGNOSIS — E860 Dehydration: Secondary | ICD-10-CM | POA: Diagnosis present

## 2020-04-12 DIAGNOSIS — N179 Acute kidney failure, unspecified: Secondary | ICD-10-CM | POA: Diagnosis present

## 2020-04-12 DIAGNOSIS — E039 Hypothyroidism, unspecified: Secondary | ICD-10-CM | POA: Diagnosis present

## 2020-04-12 DIAGNOSIS — W010XXA Fall on same level from slipping, tripping and stumbling without subsequent striking against object, initial encounter: Secondary | ICD-10-CM | POA: Diagnosis present

## 2020-04-12 DIAGNOSIS — I10 Essential (primary) hypertension: Secondary | ICD-10-CM | POA: Diagnosis present

## 2020-04-12 DIAGNOSIS — I69398 Other sequelae of cerebral infarction: Secondary | ICD-10-CM | POA: Diagnosis not present

## 2020-04-12 DIAGNOSIS — F0280 Dementia in other diseases classified elsewhere without behavioral disturbance: Secondary | ICD-10-CM | POA: Diagnosis present

## 2020-04-12 DIAGNOSIS — Z79899 Other long term (current) drug therapy: Secondary | ICD-10-CM | POA: Diagnosis not present

## 2020-04-12 DIAGNOSIS — Z79890 Hormone replacement therapy (postmenopausal): Secondary | ICD-10-CM | POA: Diagnosis not present

## 2020-04-12 DIAGNOSIS — D649 Anemia, unspecified: Secondary | ICD-10-CM | POA: Diagnosis present

## 2020-04-12 DIAGNOSIS — I471 Supraventricular tachycardia: Secondary | ICD-10-CM | POA: Diagnosis not present

## 2020-04-12 DIAGNOSIS — F0150 Vascular dementia without behavioral disturbance: Secondary | ICD-10-CM | POA: Diagnosis present

## 2020-04-12 LAB — CBC
HCT: 28.3 % — ABNORMAL LOW (ref 39.0–52.0)
Hemoglobin: 9.1 g/dL — ABNORMAL LOW (ref 13.0–17.0)
MCH: 28.7 pg (ref 26.0–34.0)
MCHC: 32.2 g/dL (ref 30.0–36.0)
MCV: 89.3 fL (ref 80.0–100.0)
Platelets: 160 10*3/uL (ref 150–400)
RBC: 3.17 MIL/uL — ABNORMAL LOW (ref 4.22–5.81)
RDW: 13 % (ref 11.5–15.5)
WBC: 10 10*3/uL (ref 4.0–10.5)
nRBC: 0 % (ref 0.0–0.2)

## 2020-04-12 LAB — PREPARE RBC (CROSSMATCH)

## 2020-04-12 LAB — ABO/RH: ABO/RH(D): O POS

## 2020-04-12 IMAGING — CT CT HEAD W/O CM
4 series · 15 of 47 positions shown, 17 images · non-contrast
Comparison: [DATE]

CLINICAL DATA: Head trauma.  Fall while on Plavix.

EXAM:
CT HEAD WITHOUT CONTRAST
TECHNIQUE: Contiguous axial images were obtained from the base of the skull
through the vertex without intravenous contrast.

[Series 3: head without · axial · non-contrast · 0.49mm/px · z∈[-142,-22]mm · 7 of 34 slices shown, 9 images]
[im 5/34  brain]
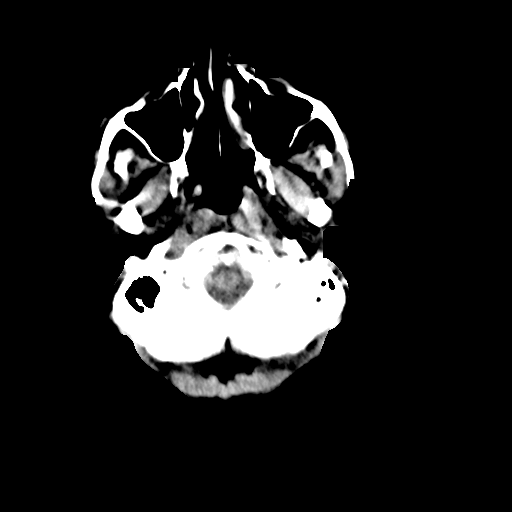
[im 5/34  bone]
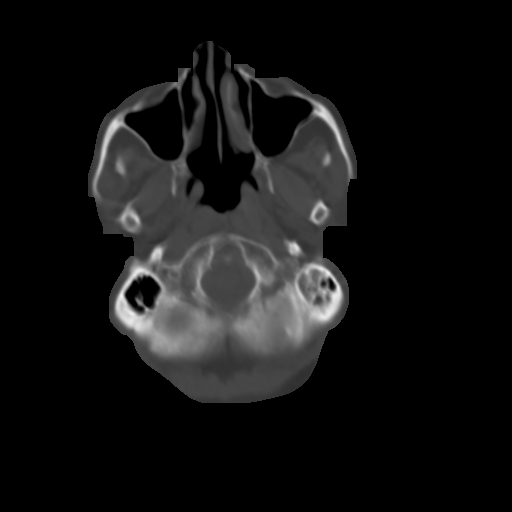
[im 9/34  brain]
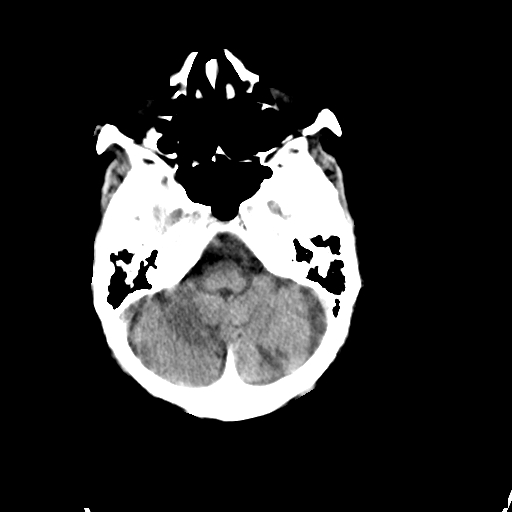
[im 13/34  brain]
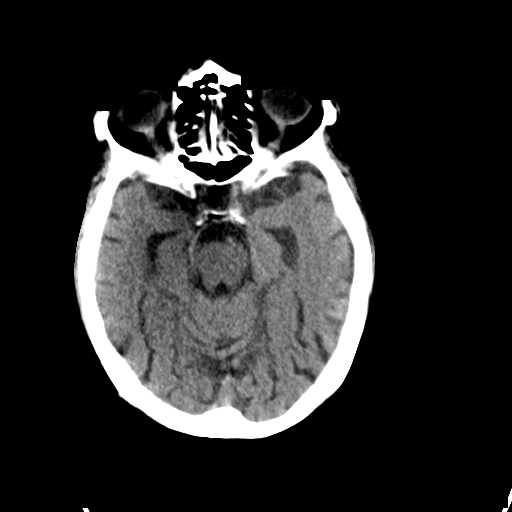
[im 17/34  brain]
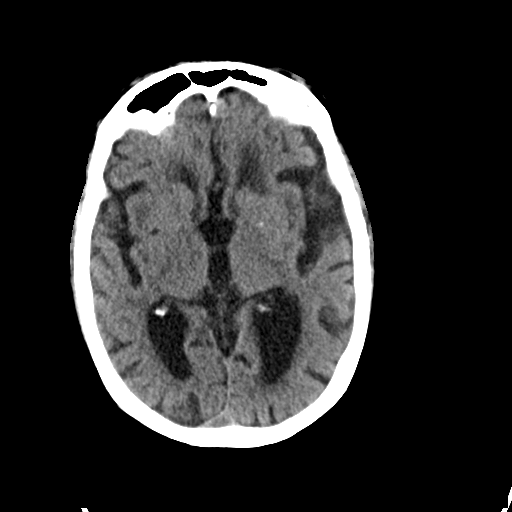
[im 21/34  brain]
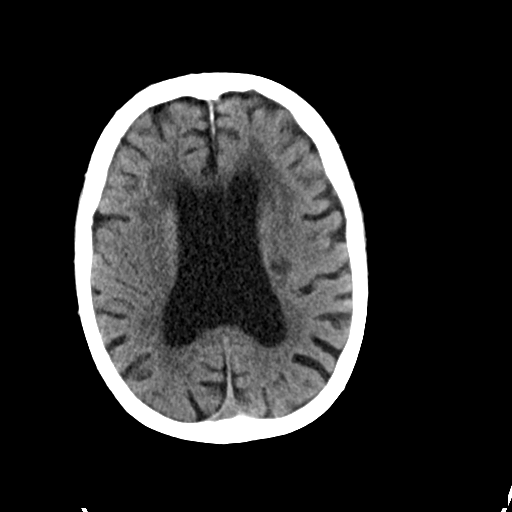
[im 21/34  bone]
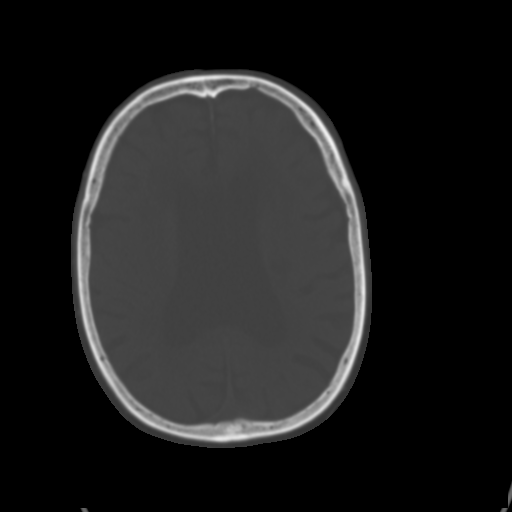
[im 25/34  brain]
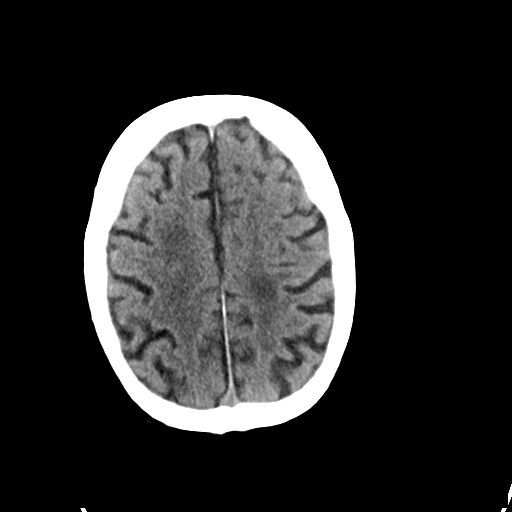
[im 29/34  brain]
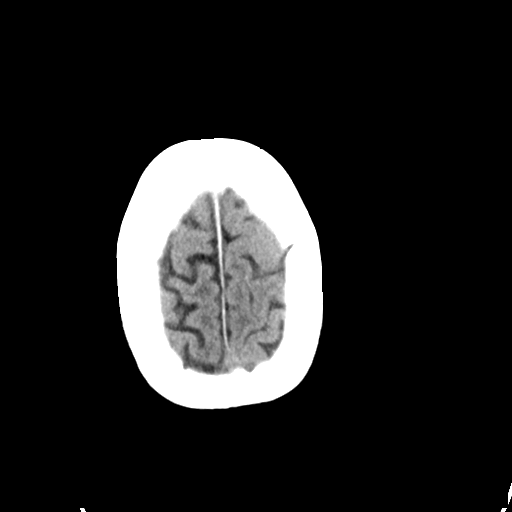

[Series 4: head bone · axial · 0.49mm/px · z∈[-146,-130]mm · 2 of 85 slices shown]
[im 9/85  bone]
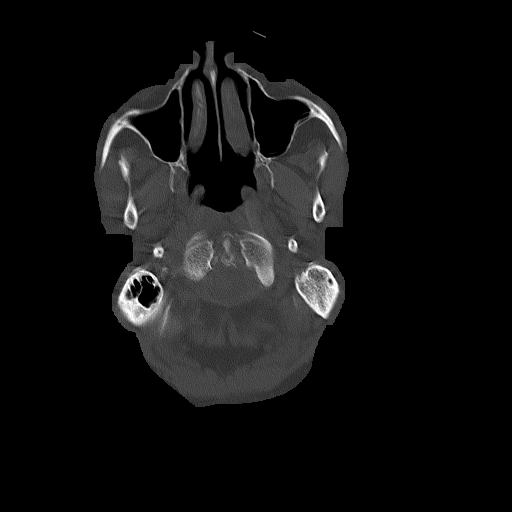
[im 17/85  bone]
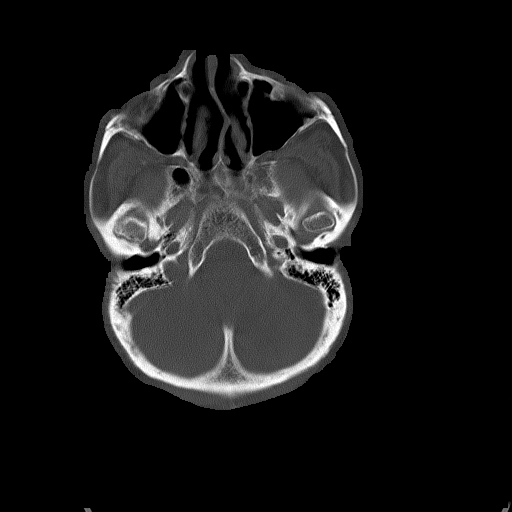

[Series 5: head without cor · coronal · non-contrast · 0.33mm/px · 3 of 73 slices shown]
[im 25/73  brain]
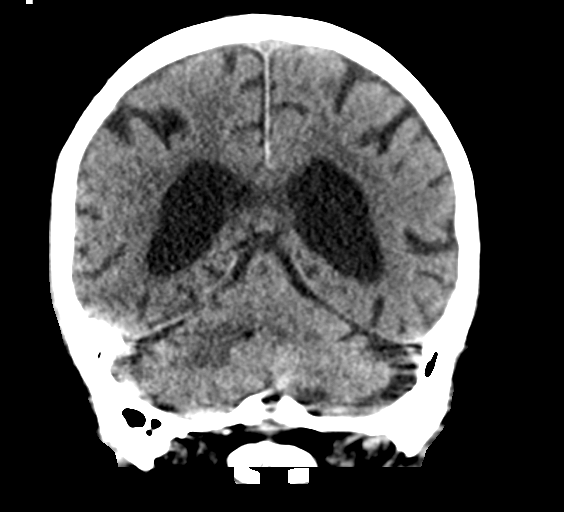
[im 33/73  brain]
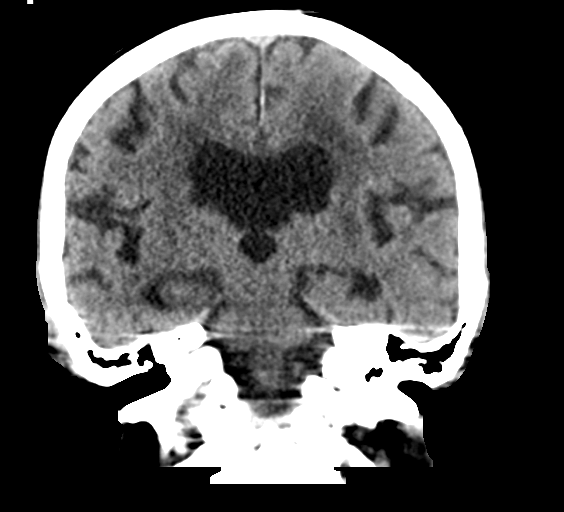
[im 41/73  brain]
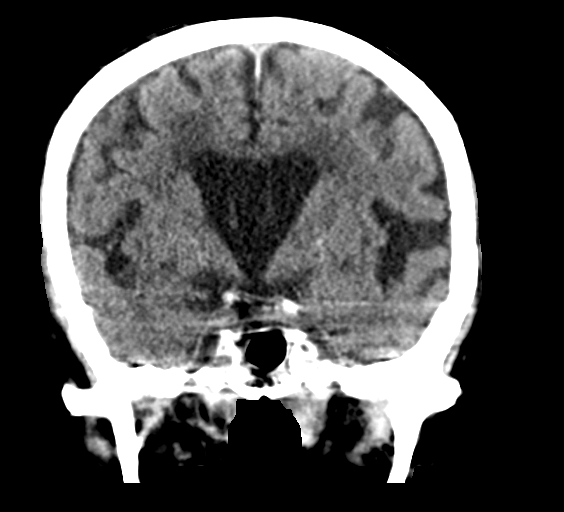

[Series 6: head without sag · sagittal · non-contrast · 0.33mm/px · 3 of 64 slices shown]
[im 22/64  brain]
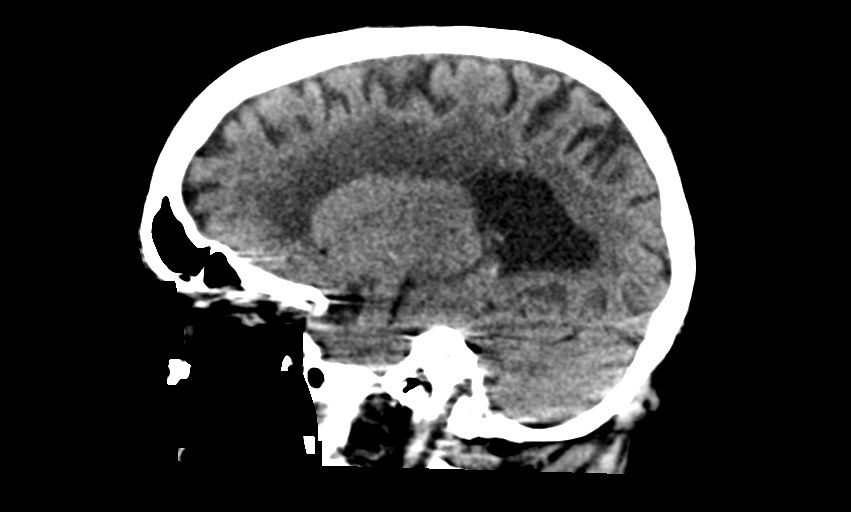
[im 32/64  brain]
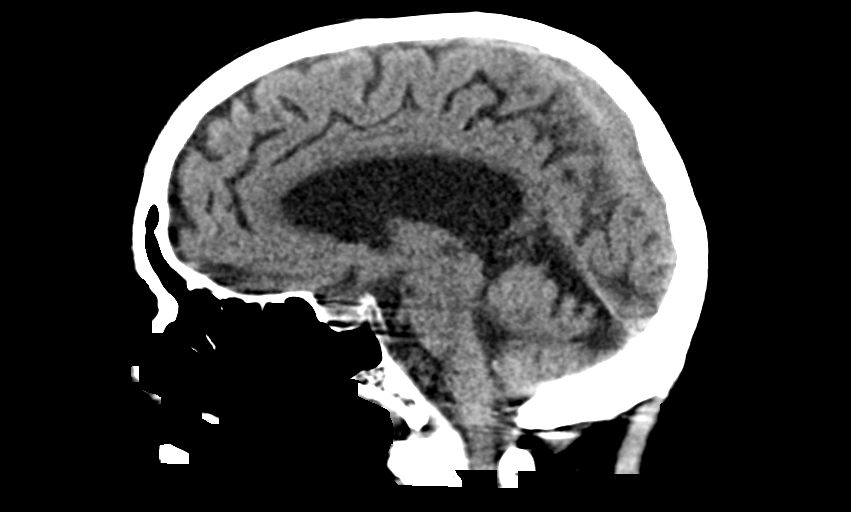
[im 43/64  brain]
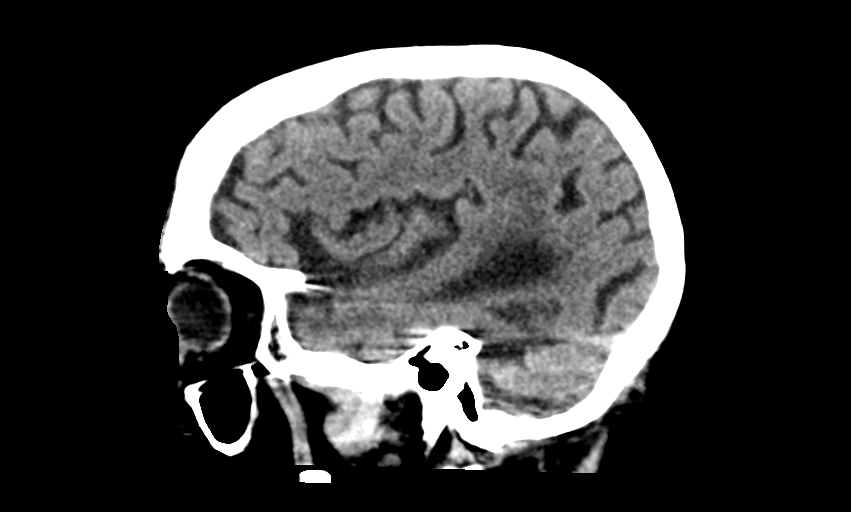

[15 of 47 positions shown; findings below may reference images not displayed]

FINDINGS: Brain: No acute intracranial hemorrhage. There is an old left
cerebellar infarct. There is periventricular hypoattenuation
compatible with chronic microvascular disease. Multiple small vessel
chronic infarcts of the deep gray nuclei.

Vascular: No abnormal hyperdensity of the major intracranial
arteries or dural venous sinuses. No intracranial atherosclerosis.

Skull: The visualized skull base, calvarium and extracranial soft
tissues are normal.

Sinuses/Orbits: No fluid levels or advanced mucosal thickening of
the visualized paranasal sinuses. No mastoid or middle ear effusion.
The orbits are normal.
IMPRESSION: 1. No acute intracranial abnormality.
2. Old left cerebellar infarct and findings of chronic microvascular
ischemia.

## 2020-04-12 MED ORDER — SODIUM CHLORIDE 0.9% IV SOLUTION: Freq: Once | INTRAVENOUS | Status: DC

## 2020-04-12 MED ORDER — POLYVINYL ALCOHOL 1.4 % OP SOLN
1.0000 [drp] | Freq: Three times a day (TID) | OPHTHALMIC | Status: DC
  Administered 2020-04-12 – 2020-04-17 (×10): 1 [drp] via OPHTHALMIC
  Filled 2020-04-12 (×3): qty 15

## 2020-04-12 MED ORDER — LOSARTAN POTASSIUM 50 MG PO TABS
100.0000 mg | ORAL_TABLET | Freq: Every day | ORAL | Status: DC
  Administered 2020-04-12 – 2020-04-17 (×6): 100 mg via ORAL
  Filled 2020-04-12 (×6): qty 2

## 2020-04-12 MED ORDER — HYPROMELLOSE (GONIOSCOPIC) 2.5 % OP SOLN: 1.0000 [drp] | Freq: Three times a day (TID) | OPHTHALMIC | Status: DC

## 2020-04-12 MED ORDER — ACETAMINOPHEN 325 MG PO TABS
650.0000 mg | ORAL_TABLET | Freq: Four times a day (QID) | ORAL | Status: DC
  Administered 2020-04-12 – 2020-04-15 (×7): 650 mg via ORAL
  Filled 2020-04-12 (×9): qty 2

## 2020-04-12 MED ORDER — ATORVASTATIN CALCIUM 10 MG PO TABS
20.0000 mg | ORAL_TABLET | Freq: Every day | ORAL | Status: DC
  Administered 2020-04-12 – 2020-04-16 (×5): 20 mg via ORAL
  Filled 2020-04-12 (×6): qty 2

## 2020-04-12 MED ORDER — POLYETHYLENE GLYCOL 3350 17 G PO PACK
17.0000 g | PACK | Freq: Every day | ORAL | Status: DC
  Administered 2020-04-12 – 2020-04-17 (×5): 17 g via ORAL
  Filled 2020-04-12 (×5): qty 1

## 2020-04-12 MED ORDER — LEVOTHYROXINE SODIUM 88 MCG PO TABS
88.0000 ug | ORAL_TABLET | Freq: Every day | ORAL | Status: DC
  Administered 2020-04-14 – 2020-04-17 (×4): 88 ug via ORAL
  Filled 2020-04-12 (×6): qty 1

## 2020-04-12 MED ORDER — VITAMIN D 25 MCG (1000 UNIT) PO TABS
1000.0000 [IU] | ORAL_TABLET | Freq: Every day | ORAL | Status: DC
  Administered 2020-04-14 – 2020-04-17 (×4): 1000 [IU] via ORAL
  Filled 2020-04-12 (×5): qty 1

## 2020-04-12 MED ORDER — OXYCODONE HCL 5 MG PO TABS
5.0000 mg | ORAL_TABLET | ORAL | Status: DC | PRN
  Administered 2020-04-12 – 2020-04-15 (×8): 5 mg via ORAL
  Filled 2020-04-12 (×8): qty 1

## 2020-04-12 NOTE — Progress Notes (Signed)
   Subjective:  Patrick Rhodes is a 86 y.o. with PMH of CVA, Hypothyroidism, HTN admit for hip fracture on hospital day 0  Patrick Rhodes was examined and evaluated at bedside this am. He was noted to be disoriented and repeatedly asks 'who's taking care of me?' Not agitated. Does not appear to be in pain  Objective:  Vital signs in last 24 hours: Vitals:   04/12/20 0600 04/12/20 0615 04/12/20 0625 04/12/20 0806  BP: 113/63 129/67 129/67 (!) 145/71  Pulse: 100 96 (!) 107 (!) 102  Resp:   16 15  Temp:   99.4 F (37.4 C) 98.3 F (36.8 C)  TempSrc:   Axillary Axillary  SpO2: 100% 100% 98% 97%   Gen: Well-developed, chronically ill-appearing, NAD CV: RRR, S1, S2 normal, No rubs, no murmurs, no gallops Pulm: CTAB, No rales, no wheezes Abd: Soft, BS+, NTND, No rebound, no guarding Extm: Unable to examine hip ROM due to patient non-participation Skin: Dry, Warm, normal turgor Neuro: AAOx1  Assessment/Plan:  Active Problems:   Hip fracture (HCC)  Patrick Rhodes is a 86 y.o. with PMH of CVA, Hypothyroidism, HTN, Dementia admit for R hip fracture after fall  Right femur comminuted intertrochanteric fracture Osteoporosis Initially presented after unwitnessed fall. Unclear history for etiology but CT head negative for brain bleeds. Ortho on board and planning for surgery tomorrow am. Would be eligible for bisphosphonate therapy due to evidence of fragility fracture with this admission. Will start at discharge. - Appreciate ortho recs: Npo at [place] for surgery - Scheduled tylenol - Oxy IR PRN for pain  Normocytic anemia Admit hgb 9. Chronically around 9-10. Per ortho, would like higher hgb pre-op. 1 unit pRBC ordered overnight. - F/u post-transfusion cbc - Trend cbc  Breakthrough COVID+  Current not requiring supplemental oxygen. Afebrile. Patient and family at home all vaccinated. Unclear date but possibly due to weakened protection with time.  - Monitor for symptoms  Acute  Kidney Injury 2/2 dehydration Admit bun 29, Creatinine 1.35. No hx of chronic kidney disease. Appeared dry on arrival - C/w NS 125cc/hr - Trend renal fx - Avoid nephrotoxic meds when able  Hypothyroidism On 30mcg levothyroxine at home - C/w home meds  DVT prophx: scd Diet: HH Bowel: Miralax Code: Full  Prior to Admission Living Arrangement: Home Anticipated Discharge Location: SNF Barriers to Discharge: Medical treatment Dispo: Anticipated discharge in approximately 2-3 day(s).   [name], MD 04/12/2020, 10:33 AM Pager: [phone] After 5pm on weekdays and 1pm on weekends: On Call Pager: [phone]

## 2020-04-12 NOTE — H&P (Signed)
Date: 04/12/2020               Patient Name:  Patrick Rhodes MRN: [id number]  DOB: 12/17/1929 Age / Sex: 86 y.o., male   PCP: System, Pcp Not In         Medical Service: Internal Medicine Teaching Service         Attending Physician: Dr. Aldine Contes, MD    First Contact: Dr. Collene Gobble Pager: [phone]  Second Contact: Dr. Truman Hayward Pager: [phone]       After Hours (After 5p/  First Contact Pager: [phone]  weekends / holidays): Second Contact Pager: [phone]   Chief Complaint: Fall  History of Present Illness:   This is an 86 year old male with a history of prior CVA/TIA's with residual weakness, hypothyroidism, vascular dementia, and hypertension who presented after a unwitnessed fall at home.  Patient has a history of vascular dementia and history was difficult to obtain from patient, he did state he was having some hip pain and kept talking about his collarbone.  History obtained from chart review and patient's son. Patient not answering questions appropriately, per son, patient's baseline.   Patient's son notes that around 74 the patient was calling for his son from his room. The patient has a bedside commode and the patient was found on the ground beside the commode and dresser. The patient's son suspects the patient leaned over to pull up his pants after using the commode, lost his balance, and fell to the ground landing on his right side. The fall was unwitnessed so the patient's son is unsure if the patient hit his head or not. The patient was complaining of his neck/collar bone hurting, however, patient's son notes that the patient broke his collar bone prior and continues to complain of pain there even without injury. Patient's son touched the collar bone area and the patient did not complain of pain. He then touched the patient's right hip and the patient complained of significant pain. The patient did not initially want EMS to be called so the patient's son assisted the  patient into the recliner, but the patient continued to be in pain even in the recliner. EMS was then called and the patient was transported to the ED.   Patient's son also notes that the patient has had no recent complaints, denied any runny nose, cough, fevers, chills. Denies anyone in the household being ill. Patient lives with son and his family, household is vaccinated with the exception of 45 year old child in the house. Patient attends [employer] once a week. Patient has had normal appetite recently. Patient is able to perform some of his adl's like eating and bathing, but has his food cooked for him and needs assistance being placed in the bathtub. Patient ambulates with cane.  Patient has history of cognitive decline, patient's son states baseline is some forgetfulness and need to be redirected. Cognitive decline is due to vascular dementia associated with strokes. Son gives example of asking patient a question and having to constantly redirect patient to relevance of conversation. Son notes patient was at baseline prior to being transported to the hospital via EMS.   Meds:  Current Meds  Medication Sig  . atorvastatin (LIPITOR) 20 MG tablet Take 1 tablet (20 mg total) by mouth daily at 6 PM. (Patient taking differently: Take 20 mg by mouth daily. )  . Cholecalciferol (VITAMIN D-3) 25 MCG (1000 UT) CAPS Take 1,000 Units by mouth daily with breakfast.  .  clopidogrel (PLAVIX) 75 MG tablet Take 75 mg by mouth daily.  . hydroxypropyl methylcellulose / hypromellose (ISOPTO TEARS / GONIOVISC) 2.5 % ophthalmic solution Place 1 drop into both eyes 3 (three) times daily.  [name] levothyroxine (SYNTHROID) 88 MCG tablet Take 88 mcg by mouth daily before breakfast.  . losartan (COZAAR) 100 MG tablet Take 100 mg by mouth daily.  . Multiple Vitamins-Minerals (PRESERVISION AREDS 2) CHEW Chew 1 tablet by mouth See admin instructions. Cut 1 tablet into two halves and chew two times a day (to equal a sum total of 2  tablets/day)     Allergies: Allergies as of 04/11/2020  . (No Known Allergies)   Past Medical History:  Diagnosis Date  . High cholesterol   . Hypertension   . Hypothyroidism   . Stroke [facility])     Family History:  On chart review patient has several family members with hypertension and CAD.  Social History:  No tobacco use No alcohol since 2008 No recreational drug use Lived with son since 2008.  Review of Systems: Limited due to patient's vascular dementia.   Physical Exam: Blood pressure 122/88, pulse 62, temperature 98.6 F (37 C), temperature source Oral, resp. rate 20, SpO2 100 %. Physical Exam HENT:     Head: Normocephalic and atraumatic.     Mouth/Throat:     Mouth: Mucous membranes are moist.     Pharynx: Oropharynx is clear.  Cardiovascular:     Rate and Rhythm: Regular rhythm. Tachycardia present.     Pulses: Normal pulses.     Heart sounds: Normal heart sounds.  Pulmonary:     Comments: Limited lung exam, no obvious wheezing, rhonchi, or rales. Abdominal:     General: Abdomen is flat.     Palpations: Abdomen is soft.  Musculoskeletal:     Comments: Patient declined evaluation of legs.  Skin:    General: Skin is warm and dry.  Neurological:     Mental Status: He is alert.     Comments: Moves all extremities, only intermittently following commands, oriented to self.  Declined evaluation.  Psychiatric:     Comments: Intermittent agitation, declining multiple physical exams    EKG: personally reviewed my interpretation is sinus tachycardia with ventricular premature complexes, diffuse artifact noted, no ST changes or TWI  CXR: personally reviewed my interpretation is poor positioning, possible cardiomegaly, no acute cardiopulmonary abnormality noted, noted lucency along left mediastinal border could be prominent pulmonary artery or potential hiatal hernia  Hip and pelvis x-ray: IMPRESSION: 1. Comminuted, obliquely oriented intertrochanteric right  femur fracture with slight foreshortening, external rotation and valgus angulation across the fracture line. Femoral head remains normally located. 2. The osseous structures appear diffusely demineralized. 3. No other discernible acute osseous injuries. 4. Large bony excrescence along the left iliac crest, indeterminate but possibly enthesopathic, related to the gluteal musculature.  Right knee x-ray: IMPRESSION: 1. Corticated fragment anterior to the patella could reflect a chronic degenerative enthesopathic change, or sequela of remote injury. Acute injury is less favored though some overlying soft tissue swelling is present. Correlate with point tenderness. 2. No other acute or suspicious osseous injury. 3. 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   Assessment & Plan by Problem: Active Problems:   Hip fracture [facility])  This is a 86 year old male with a history of vascular dementia, HTN, hypothyroidism, symptomatic anemia, and prior CVA presenting after an unwitnessed fall. Found to have a right femur  fracture on hip x-ray.   Right femur fracture 2/2 unwitnessed fall:  Patient presenting after an unwitnessed fall from standing height. Difficult to obtain if syncopal symptoms occurred however patient and son denied any symptoms. Interview and exam limited due to patients vascular dementia and patient declining services. X-ray showed comminuted, obliquely oriented intertrochanteric right femur fracture. CT Head w/o contrast negative for intracranial pathology and hemorrhage. Patient fell from standing height and fractured hip, this meets diagnostic criteria for osteoporosis without DEXA scan as considered fragility fracture. Will consider starting medical therapy during admission or prior to discharge.  -Orthopedics following, planning surgical IM pinning in AM, obtaining CT  scan -Pain control with fentanyl   Anemia: History of normocytic anemia, BL around 10. Hgb 9 on admission. No active signs of bleeding. Will need 1 unit pRBC prior to surgery per orthopedics.  -CBC in AM -Typed and screened, transfusing 1 unit of pRBC. Post transfusion H&H ordered  COVID +:  Noted on labs, no shortness of breath, cough, fever, chills. Patient has received both vaccinations. No sick contacts noted. Family is vaccinated at home.  AKI:  Cr 1.35, up from 0.89 1 year ago. Could be pre-renal. Will give fluids and monitor response.  -Continue NS 125 cc/hr -BMP in AM  HTN: On losartan 100 mg daily at home. BP well controlled at this time. Holding for normotension and NPO status.   Hypothyroidism: -On 75 mcg levothyroxine at home. Will resume once diet placed.   Diet: NPO DVT Prophylaxis: SCD's Code Status: Full Code Dispo: Admit patient to Inpatient with expected length of stay greater than 2 midnights.  Signed: Asencion Noble, MD 04/12/2020, 1:47 AM  Pager: @MYPAGER @ After 5pm on weekdays and 1pm on weekends: On Call pager: [phone]

## 2020-04-12 NOTE — ED Notes (Signed)
Jamone Garrido RN, and Journalist, newspaper, spoke to Delcie Roch to receive verbal consent for Blood Transfusion.

## 2020-04-12 NOTE — Consult Note (Addendum)
ORTHOPAEDIC CONSULTATION  REQUESTING PHYSICIAN: Aldine Contes, MD  PCP:  System, Pcp Not In  Chief Complaint: Right hip fracture  HPI: Patrick Rhodes is a 86 y.o. male who complains of right hip pain after a fall at home. He has history of stroke, and is currently Covid positive. No respiratory symptoms. History unable to be obtained from patient during exam due to patient suffering from dementia. Son reports that he was getting off of his bedside potty and leaned over to pull his pants up and fell on his right side.  He initially was put back in bed but was having significant right-sided hip pain. Normally ambulates with a cane. Orthopaedic consultation placed for hip fracture management.   Past Medical History:  Diagnosis Date  . High cholesterol   . Hypertension   . Hypothyroidism   . Stroke [facility])    No past surgical history on file. Social History   Socioeconomic History  . Marital status: Widowed    Spouse name: Not on file  . Number of children: Not on file  . Years of education: Not on file  . Highest education level: Not on file  Occupational History  . Not on file  Tobacco Use  . Smoking status: Never Smoker  . Smokeless tobacco: Never Used  Substance and Sexual Activity  . Alcohol use: No  . Drug use: No  . Sexual activity: Not on file  Other Topics Concern  . Not on file  Social History Narrative  . Not on file   Social Determinants of Health   Financial Resource Strain:   . Difficulty of Paying Living Expenses: Not on file  Food Insecurity:   . Worried About Charity fundraiser in the Last Year: Not on file  . Ran Out of Food in the Last Year: Not on file  Transportation Needs:   . Lack of Transportation (Medical): Not on file  . Lack of Transportation (Non-Medical): Not on file  Physical Activity:   . Days of Exercise per Week: Not on file  . Minutes of Exercise per Session: Not on file  Stress:   . Feeling of Stress : Not on file    Social Connections:   . Frequency of Communication with Friends and Family: Not on file  . Frequency of Social Gatherings with Friends and Family: Not on file  . Attends Religious Services: Not on file  . Active Member of Clubs or Organizations: Not on file  . Attends Archivist Meetings: Not on file  . Marital Status: Not on file   No family history on file. No Known Allergies Prior to Admission medications   Medication Sig Start Date End Date Taking? Authorizing Provider  atorvastatin (LIPITOR) 20 MG tablet Take 1 tablet (20 mg total) by mouth daily at 6 PM. Patient taking differently: Take 20 mg by mouth daily.  10/19/18  Yes [name], MD  Cholecalciferol (VITAMIN D-3) 25 MCG (1000 UT) CAPS Take 1,000 Units by mouth daily with breakfast.   Yes [provider]  clopidogrel (PLAVIX) 75 MG tablet Take 75 mg by mouth daily.   Yes [provider]  hydroxypropyl methylcellulose / hypromellose (ISOPTO TEARS / GONIOVISC) 2.5 % ophthalmic solution Place 1 drop into both eyes 3 (three) times daily.   Yes [provider]  levothyroxine (SYNTHROID) 88 MCG tablet Take 88 mcg by mouth daily before breakfast.   Yes [provider]  losartan (COZAAR) 100 MG tablet Take  100 mg by mouth daily.   Yes [provider]  Multiple Vitamins-Minerals (PRESERVISION AREDS 2) CHEW Chew 1 tablet by mouth See admin instructions. Cut 1 tablet into two halves and chew two times a day (to equal a sum total of 2 tablets/day)   Yes [provider]  levothyroxine (SYNTHROID, LEVOTHROID) 75 MCG tablet Take 75 mcg by mouth daily before breakfast. Patient not taking: Reported on 04/11/2020    [provider]  Multiple Vitamin (MULTIVITAMIN WITH MINERALS) TABS tablet Take 1 tablet by mouth daily. Patient not taking: Reported on 04/11/2020    [provider]   DG Chest Port 1 View  Result Date: 04/12/2020 CLINICAL DATA:  Femur fracture,  COVID-19 positive EXAM: PORTABLE CHEST 1 VIEW COMPARISON:  Radiograph 10/18/2018 FINDINGS: Suboptimal patient positioning with a steep left anterior obliquity superimposing portions of the mediastinum over the right chest and resulting in atypical projection of the mediastinum. Suspect cardiomegaly, similar to comparison with a tortuous, calcified aorta. Lucency along the left mediastinal border may be projection of lucent lung beneath a prominent pulmonary artery or potential hiatal hernia. Chronically coarsened interstitial changes are similar to priors. No consolidation, features of edema, pneumothorax, or effusion. No acute osseous or soft tissue abnormality. Degenerative changes are present in the imaged spine and shoulders. Telemetry leads overlie the chest. IMPRESSION: 1. Suboptimal patient positioning. 2. Lucency along the left mediastinal border may be projection of lucent lung beneath a prominent pulmonary artery or potential hiatal hernia. 3. Suspect cardiomegaly, similar to comparison. 4. No acute cardiopulmonary abnormality. Electronically Signed   By: Lovena Le M.D.   On: 04/12/2020 00:15   DG Knee Right Port  Result Date: 04/12/2020 CLINICAL DATA:  Left femur fracture EXAM: PORTABLE RIGHT KNEE - 1-2 VIEW COMPARISON:  Hip radiograph 04/11/2020 FINDINGS: The osseous structures appear diffusely demineralized which may limit detection of small or nondisplaced fractures. Corticated fragment anterior to the patella could reflect a chronic degenerative enthesopathic change, or sequela of remote injury, unlikely to be acute although some mild overlying soft tissue thickening is present at this time. No definite acute fracture or traumatic malalignment. Moderate tricompartmental degenerative changes with chondrocalcinosis of the bilateral menisci. Vascular calcium in the posterior soft tissues. Trace effusion. IMPRESSION: 1. Corticated fragment anterior to the patella could reflect a chronic  degenerative enthesopathic change, or sequela of remote injury. Acute injury is less favored though some overlying soft tissue swelling is present. Correlate with point tenderness. 2. No other acute or suspicious osseous injury. 3. 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 Electronically Signed   By: Lovena Le M.D.   On: 04/12/2020 00:17   DG Hip Unilat  With Pelvis 2-3 Views Right  Result Date: 04/11/2020 CLINICAL DATA:  Fall EXAM: DG HIP (WITH OR WITHOUT PELVIS) 2-3V RIGHT COMPARISON:  None. FINDINGS: The osseous structures appear diffusely demineralized which may limit detection of small or nondisplaced fractures. There is a comminuted obliquely oriented inter trochanteric right femur fracture with slight foreshortening, external rotation and valgus angulation across the fracture line. Proximal femoral component including the articular surface of the femoral head, remains in normal articulation with the right acetabulum. Remaining bones of the pelvis are intact and congruent albeit with suboptimal assessment of the sacrum due to extensive overlying bowel gas and bowel contents. Degenerative changes noted throughout the lower lumbar spine, hips and pelvis. Large bony excrescence along the left iliac crest may be degenerative or enthesopathic in  nature possibly related to the gluteal musculature. Atherosclerotic calcifications in the soft tissues. IMPRESSION: 1. Comminuted, obliquely oriented intertrochanteric right femur fracture with slight foreshortening, external rotation and valgus angulation across the fracture line. Femoral head remains normally located. 2. The osseous structures appear diffusely demineralized. 3. No other discernible acute osseous injuries. 4. Large bony excrescence along the left iliac crest, indeterminate but possibly enthesopathic, related to the gluteal musculature.  Electronically Signed   By: Lovena Le M.D.   On: 04/11/2020 22:10    Positive ROS: All other systems have been reviewed and were otherwise negative with the exception of those mentioned in the HPI and as above.  Physical Exam: General: Alert, pleasantly confused, no acute distress Cardiovascular: No pedal edema Respiratory: No cyanosis, no use of accessory musculature GI: No organomegaly, abdomen is soft and non-tender Skin: No lesions in the area of chief complaint Neurologic: Sensation intact distally Psychiatric: Patient is confused Lymphatic: No axillary or cervical lymphadenopathy  MUSCULOSKELETAL: Right hip: Deformity,shortened and externally rotated, and tenderness present. Decreased range of motion. Normal pulses and sensation  Assessment: Right peritrochanteric femur fracture  Plan: Bedrest for now Needs IM hip fracture nail hgb 9.0 - Needs one unit PRBCs in preparation for surgery Surgery Monday with Dr. Stann Mainland. NPO after MN Sunday night.    Dorothyann Peng, PA [phone]    04/12/2020 12:51 AM

## 2020-04-13 ENCOUNTER — Inpatient Hospital Stay (HOSPITAL_COMMUNITY): Payer: Medicare (Managed Care)

## 2020-04-13 ENCOUNTER — Inpatient Hospital Stay (HOSPITAL_COMMUNITY): Payer: Medicare (Managed Care) | Admitting: Certified Registered"

## 2020-04-13 ENCOUNTER — Encounter (HOSPITAL_COMMUNITY)
Admission: EM | Disposition: A | Discharge status: Home Health | Payer: Self-pay | Source: Home / Self Care | Attending: Internal Medicine

## 2020-04-13 HISTORY — PX: INTRAMEDULLARY (IM) NAIL INTERTROCHANTERIC: SHX5875

## 2020-04-13 LAB — BASIC METABOLIC PANEL
Anion gap: 8 (ref 5–15)
BUN: 33 mg/dL — ABNORMAL HIGH (ref 8–23)
CO2: 26 mmol/L (ref 22–32)
Calcium: 9.9 mg/dL (ref 8.9–10.3)
Chloride: 107 mmol/L (ref 98–111)
Creatinine, Ser: 1.27 mg/dL — ABNORMAL HIGH (ref 0.61–1.24)
GFR calc Af Amer: 58 mL/min — ABNORMAL LOW (ref >60)
GFR calc non Af Amer: 50 mL/min — ABNORMAL LOW (ref >60)
Glucose, Bld: 117 mg/dL — ABNORMAL HIGH (ref 70–99)
Potassium: 4.2 mmol/L (ref 3.5–5.1)
Sodium: 141 mmol/L (ref 135–145)

## 2020-04-13 LAB — TYPE AND SCREEN
ABO/RH(D): O POS
Antibody Screen: NEGATIVE
Unit division: 0
Unit division: 0

## 2020-04-13 LAB — BPAM RBC
Blood Product Expiration Date: 202109162359
Blood Product Expiration Date: 202109172359
ISSUE DATE / TIME: 202108220601
ISSUE DATE / TIME: 202108222242
Unit Type and Rh: 5100
Unit Type and Rh: 5100

## 2020-04-13 LAB — CBC
HCT: 30.7 % — ABNORMAL LOW (ref 39.0–52.0)
Hemoglobin: 10.1 g/dL — ABNORMAL LOW (ref 13.0–17.0)
MCH: 29.1 pg (ref 26.0–34.0)
MCHC: 32.9 g/dL (ref 30.0–36.0)
MCV: 88.5 fL (ref 80.0–100.0)
Platelets: 148 10*3/uL — ABNORMAL LOW (ref 150–400)
RBC: 3.47 MIL/uL — ABNORMAL LOW (ref 4.22–5.81)
RDW: 13.3 % (ref 11.5–15.5)
WBC: 9 10*3/uL (ref 4.0–10.5)
nRBC: 0 % (ref 0.0–0.2)

## 2020-04-13 LAB — HEMOGLOBIN AND HEMATOCRIT, BLOOD
HCT: 30.4 % — ABNORMAL LOW (ref 39.0–52.0)
Hemoglobin: 9.9 g/dL — ABNORMAL LOW (ref 13.0–17.0)

## 2020-04-13 LAB — CBG MONITORING, ED: Glucose-Capillary: 100 mg/dL — ABNORMAL HIGH (ref 70–99)

## 2020-04-13 SURGERY — FIXATION, FRACTURE, INTERTROCHANTERIC, WITH INTRAMEDULLARY ROD
Anesthesia: General | Laterality: Right

## 2020-04-13 MED ORDER — METOCLOPRAMIDE HCL 5 MG/ML IJ SOLN: 5.0000 mg | Freq: Three times a day (TID) | INTRAMUSCULAR | Status: DC | PRN

## 2020-04-13 MED ORDER — TRANEXAMIC ACID-NACL 1000-0.7 MG/100ML-% IV SOLN: 1000.0000 mg | INTRAVENOUS | Status: DC

## 2020-04-13 MED ORDER — ENSURE PRE-SURGERY PO LIQD: 296.0000 mL | Freq: Once | ORAL | Status: DC

## 2020-04-13 MED ORDER — ONDANSETRON HCL 4 MG PO TABS: 4.0000 mg | ORAL_TABLET | Freq: Four times a day (QID) | ORAL | Status: DC | PRN

## 2020-04-13 MED ORDER — ONDANSETRON HCL 4 MG/2ML IJ SOLN
INTRAMUSCULAR | Status: DC | PRN
  Administered 2020-04-13: 4 mg via INTRAVENOUS

## 2020-04-13 MED ORDER — LACTATED RINGERS IV SOLN: INTRAVENOUS | Status: DC | PRN

## 2020-04-13 MED ORDER — METOCLOPRAMIDE HCL 10 MG PO TABS: 5.0000 mg | ORAL_TABLET | Freq: Three times a day (TID) | ORAL | Status: DC | PRN

## 2020-04-13 MED ORDER — PHENYLEPHRINE HCL (PRESSORS) 10 MG/ML IV SOLN
INTRAVENOUS | Status: DC | PRN
  Administered 2020-04-13 (×2): 80 ug via INTRAVENOUS

## 2020-04-13 MED ORDER — CEFAZOLIN SODIUM-DEXTROSE 1-4 GM/50ML-% IV SOLN
1.0000 g | Freq: Four times a day (QID) | INTRAVENOUS | Status: AC
  Administered 2020-04-13 – 2020-04-14 (×3): 1 g via INTRAVENOUS
  Filled 2020-04-13 (×3): qty 50

## 2020-04-13 MED ORDER — VANCOMYCIN HCL IN DEXTROSE 1-5 GM/200ML-% IV SOLN: 1000.0000 mg | INTRAVENOUS | Status: DC

## 2020-04-13 MED ORDER — ROCURONIUM BROMIDE 10 MG/ML (PF) SYRINGE
PREFILLED_SYRINGE | INTRAVENOUS | Status: AC
  Filled 2020-04-13: qty 10

## 2020-04-13 MED ORDER — DEXAMETHASONE SODIUM PHOSPHATE 10 MG/ML IJ SOLN
INTRAMUSCULAR | Status: DC | PRN
  Administered 2020-04-13: 4 mg via INTRAVENOUS

## 2020-04-13 MED ORDER — CEFAZOLIN SODIUM-DEXTROSE 2-3 GM-%(50ML) IV SOLR
INTRAVENOUS | Status: DC | PRN
  Administered 2020-04-13: 2 g via INTRAVENOUS

## 2020-04-13 MED ORDER — FENTANYL CITRATE (PF) 100 MCG/2ML IJ SOLN
INTRAMUSCULAR | Status: DC | PRN
Start: 2020-04-13 — End: 2020-04-13
  Administered 2020-04-13: 50 ug via INTRAVENOUS
  Administered 2020-04-13: 100 ug via INTRAVENOUS
  Administered 2020-04-13 (×2): 50 ug via INTRAVENOUS

## 2020-04-13 MED ORDER — SUCCINYLCHOLINE CHLORIDE 20 MG/ML IJ SOLN
INTRAMUSCULAR | Status: DC | PRN
  Administered 2020-04-13: 120 mg via INTRAVENOUS

## 2020-04-13 MED ORDER — PROPOFOL 10 MG/ML IV BOLUS
INTRAVENOUS | Status: DC | PRN
  Administered 2020-04-13: 120 mg via INTRAVENOUS

## 2020-04-13 MED ORDER — ONDANSETRON HCL 4 MG/2ML IJ SOLN: 4.0000 mg | Freq: Four times a day (QID) | INTRAMUSCULAR | Status: DC | PRN

## 2020-04-13 MED ORDER — DOCUSATE SODIUM 100 MG PO CAPS
100.0000 mg | ORAL_CAPSULE | Freq: Two times a day (BID) | ORAL | Status: DC
  Administered 2020-04-14 – 2020-04-17 (×4): 100 mg via ORAL
  Filled 2020-04-13 (×7): qty 1

## 2020-04-13 MED ORDER — PROMETHAZINE HCL 25 MG/ML IJ SOLN: 6.2500 mg | INTRAMUSCULAR | Status: DC | PRN

## 2020-04-13 MED ORDER — POVIDONE-IODINE 10 % EX SWAB: 2.0000 "application " | Freq: Once | CUTANEOUS | Status: DC

## 2020-04-13 MED ORDER — CHLORHEXIDINE GLUCONATE 4 % EX LIQD: 60.0000 mL | Freq: Once | CUTANEOUS | Status: DC

## 2020-04-13 MED ORDER — SUGAMMADEX SODIUM 200 MG/2ML IV SOLN
INTRAVENOUS | Status: DC | PRN
  Administered 2020-04-13: 200 mg via INTRAVENOUS

## 2020-04-13 MED ORDER — LIDOCAINE 2% (20 MG/ML) 5 ML SYRINGE
INTRAMUSCULAR | Status: DC | PRN
  Administered 2020-04-13: 100 mg via INTRAVENOUS

## 2020-04-13 MED ORDER — OXYCODONE HCL 5 MG PO TABS: 5.0000 mg | ORAL_TABLET | Freq: Once | ORAL | Status: DC | PRN

## 2020-04-13 MED ORDER — FENTANYL CITRATE (PF) 100 MCG/2ML IJ SOLN
INTRAMUSCULAR | Status: AC
Start: 2020-04-13 — End: 2020-04-14
  Filled 2020-04-13: qty 2

## 2020-04-13 MED ORDER — PHENYLEPHRINE HCL-NACL 10-0.9 MG/250ML-% IV SOLN
INTRAVENOUS | Status: DC | PRN
  Administered 2020-04-13: 20 ug/min via INTRAVENOUS

## 2020-04-13 MED ORDER — FENTANYL CITRATE (PF) 250 MCG/5ML IJ SOLN
INTRAMUSCULAR | Status: AC
  Filled 2020-04-13: qty 5

## 2020-04-13 MED ORDER — OXYCODONE HCL 5 MG/5ML PO SOLN: 5.0000 mg | Freq: Once | ORAL | Status: DC | PRN

## 2020-04-13 MED ORDER — FENTANYL CITRATE (PF) 100 MCG/2ML IJ SOLN
25.0000 ug | INTRAMUSCULAR | Status: DC | PRN
  Administered 2020-04-13 (×3): 25 ug via INTRAVENOUS

## 2020-04-13 MED ORDER — PROPOFOL 10 MG/ML IV BOLUS
INTRAVENOUS | Status: AC
  Filled 2020-04-13: qty 20

## 2020-04-13 MED ORDER — ROCURONIUM BROMIDE 10 MG/ML (PF) SYRINGE
PREFILLED_SYRINGE | INTRAVENOUS | Status: DC | PRN
  Administered 2020-04-13: 30 mg via INTRAVENOUS
  Administered 2020-04-13: 20 mg via INTRAVENOUS

## 2020-04-13 SURGICAL SUPPLY — 39 items
ALCOHOL 70% 16 OZ (MISCELLANEOUS) ×3 IMPLANT
BIT DRILL CALIBRATED 4.2 (BIT) IMPLANT
BNDG COHESIVE 6X5 TAN STRL LF (GAUZE/BANDAGES/DRESSINGS) ×6 IMPLANT
CANISTER SUCT 3000ML PPV (MISCELLANEOUS) ×3 IMPLANT
COVER PERINEAL POST (MISCELLANEOUS) ×3 IMPLANT
COVER SURGICAL LIGHT HANDLE (MISCELLANEOUS) ×3 IMPLANT
COVER WAND RF STERILE (DRAPES) ×3 IMPLANT
DRAPE HALF SHEET 40X57 (DRAPES) IMPLANT
DRAPE INCISE IOBAN 66X45 STRL (DRAPES) ×3 IMPLANT
DRAPE STERI IOBAN 125X83 (DRAPES) ×3 IMPLANT
DRILL BIT CALIBRATED 4.2 (BIT) ×3
DRSG ADAPTIC 3X8 NADH LF (GAUZE/BANDAGES/DRESSINGS) ×3 IMPLANT
DURAPREP 26ML APPLICATOR (WOUND CARE) ×3 IMPLANT
ELECT CAUTERY BLADE 6.4 (BLADE) ×3 IMPLANT
ELECT REM PT RETURN 9FT ADLT (ELECTROSURGICAL) ×3
ELECTRODE REM PT RTRN 9FT ADLT (ELECTROSURGICAL) ×1 IMPLANT
GAUZE SPONGE 4X4 12PLY STRL (GAUZE/BANDAGES/DRESSINGS) ×2 IMPLANT
GAUZE SPONGE 4X4 12PLY STRL LF (GAUZE/BANDAGES/DRESSINGS) ×3 IMPLANT
GLOVE BIO SURGEON STRL SZ7.5 (GLOVE) ×3 IMPLANT
GLOVE BIOGEL PI IND STRL 8 (GLOVE) ×1 IMPLANT
GLOVE BIOGEL PI INDICATOR 8 (GLOVE) ×2
GOWN STRL REUS W/ TWL LRG LVL3 (GOWN DISPOSABLE) ×1 IMPLANT
GOWN STRL REUS W/ TWL XL LVL3 (GOWN DISPOSABLE) ×1 IMPLANT
GOWN STRL REUS W/TWL LRG LVL3 (GOWN DISPOSABLE) ×3
GOWN STRL REUS W/TWL XL LVL3 (GOWN DISPOSABLE) ×3
GUIDEWIRE 3.2X400 (WIRE) ×2 IMPLANT
KIT BASIN OR (CUSTOM PROCEDURE TRAY) ×3 IMPLANT
KIT TURNOVER KIT B (KITS) ×3 IMPLANT
NAIL TROCH FIX 10X170 130 (Nail) ×2 IMPLANT
NS IRRIG 1000ML POUR BTL (IV SOLUTION) ×3 IMPLANT
PACK GENERAL/GYN (CUSTOM PROCEDURE TRAY) ×3 IMPLANT
PAD ARMBOARD 7.5X6 YLW CONV (MISCELLANEOUS) ×6 IMPLANT
SCREW FENES TFNA 100 (Screw) ×2 IMPLANT
SCREW LOCK STAR 5X38 (Screw) ×2 IMPLANT
STAPLER VISISTAT 35W (STAPLE) ×3 IMPLANT
SUT MON AB 2-0 CT1 36 (SUTURE) ×3 IMPLANT
TOWEL GREEN STERILE (TOWEL DISPOSABLE) ×3 IMPLANT
TOWEL GREEN STERILE FF (TOWEL DISPOSABLE) ×3 IMPLANT
WATER STERILE IRR 1000ML POUR (IV SOLUTION) ×3 IMPLANT

## 2020-04-13 NOTE — Op Note (Signed)
Date of Surgery: 04/13/2020  INDICATIONS: Patrick Rhodes is a 86 y.o.-year-old male who sustained a right hip fracture. The risks and benefits of the procedure discussed with the patient and family prior to the procedure and all questions were answered; consent was obtained.  PREOPERATIVE DIAGNOSIS: right hip fracture   POSTOPERATIVE DIAGNOSIS: Same   PROCEDURE: Treatment of intertrochanteric, pertrochanteric, subtrochanteric fracture with intramedullary implant. CPT [phone]   SURGEON: Dannielle Karvonen. Stann Mainland, M.D.   PA:  Jonelle Sidle, PA-C.  Assistant attestation:  PA Mcclung was necessary during the surgery for all critical steps.  He was utilized for positioning the patient, retractor placement, reduction of fracture as well as implanting the final implants.  He was also utilized for closure and safely transferring the patient back to PACU.   ANESTHESIA: general   IV FLUIDS AND URINE: See anesthesia record   ESTIMATED BLOOD LOSS: 50 cc  IMPLANTS:  10 x 170 mm Synthes TFNA 100 mm proximal compression screw Distally 5.0 x 36 mm  DRAINS: None.   COMPLICATIONS: None.   DESCRIPTION OF PROCEDURE: The patient was brought to the operating room and placed supine on the operating table. The patient's leg had been signed prior to the procedure. The patient had the anesthesia placed by the anesthesiologist. The prep verification and incision time-outs were performed to confirm that this was the correct patient, site, side and location. The patient had an SCD on the opposite lower extremity. The patient did receive antibiotics prior to the incision and was re-dosed during the procedure as needed at indicated intervals. The patient was positioned on the fracture table with the table in traction and internal rotation to reduce the hip. The well leg was placed in a scissor position and all bony prominences were well-padded. The patient had the lower extremity prepped and draped in the standard  surgical fashion. The incision was made 4 finger breadths superior to the greater trochanter. A guide pin was inserted into the tip of the greater trochanter under fluoroscopic guidance. An opening reamer was used to gain access to the femoral canal. The nail length was measured and inserted down the femoral canal to its proper depth. The appropriate version of insertion for the lag screw was found under fluoroscopy. A pin was inserted up the femoral neck through the jig. The length of the lag screw was then measured. The lag screw was inserted as near to center-center in the head as possible. The leg was taken out of traction, then the compression screw was used to compress across the fracture. Compression was visualized on serial xrays.   We next turned our attention to the distal interlocking screw.  This was placed through the drill guide of the nail inserter.  A small incision was made overlying the lateral thigh at the screw site, and a tonsil was used to disect down to bone.  A drill pass was made through the jig and across the nail through both cortices.  This was measured, and the appropriate screw was placed under hand power and found to have good bite.    The wound was copiously irrigated with saline and the subcutaneous layer closed with 2.0 vicryl and the skin was reapproximated with staples. The wounds were cleaned and dried a final time and a sterile dressing was placed. The hip was taken through a range of motion at the end of the case under fluoroscopic imaging to visualize the approach-withdraw phenomenon and confirm implant length in the head.  The patient tolerated the procedure well.  There were no intraoperative complications.  Patient was Covid positive and was recovered in the operative theater.  We left the room with the postoperative care team in the room providing his care.  POSTOPERATIVE PLAN: The patient will be weight bearing as tolerated and will return in 2 weeks for staple  removal and the patient will receive DVT prophylaxis based on other medications, activity level, and risk ratio of bleeding to thrombosis.     Patrick Rile, MD [facility] [place] [phone] 12:32 PM

## 2020-04-13 NOTE — Progress Notes (Addendum)
   Subjective:  Patrick Rhodes is a 86 y.o. with PMH of CVA, Hypothyroidism, HTN admit for hip fracture on hospital day 1   Patrick Rhodes was examined and evaluated at bedside this am. He mentions being concerned that one of his foot is bigger than the other. Continues to be disoriented. Denies any significant pain  Objective:  Vital signs in last 24 hours: Vitals:   04/13/20 0109 04/13/20 0130 04/13/20 0230 04/13/20 0330  BP: (!) 156/79 (!) 148/75 (!) 149/81 140/84  Pulse: 86 96 90 92  Resp: 16 17 16 20   Temp: 98.2 F (36.8 C)     TempSrc: Oral     SpO2: 97% 98% 97% 97%   Gen: Well-developed, chornically ill-appearing, NAD HEENT: NCAT head, hearing intact, EOMI, MMM Pulm: Breathing comfortably on room air, no cough, no distress  Extm: RLE edema with warmth, ROM unable to assess Skin: Dry, Warm, normal turgor Neuro: AAOx1  Assessment/Plan:  Active Problems:   Hip fracture (HCC)  Patrick Rhodes is a 86 y.o. with PMH of CVA, Hypothyroidism, HTN, Dementia admit for R hip fracture after fall  Right femur comminuted intertrochanteric fracture Osteoporosis Initially presented after unwitnessed fall. Unclear history for etiology but CT head negative for brain bleeds. Ortho on board and planning for surgery today. Would be eligible for bisphosphonate therapy due to evidence of fragility fracture with this admission. Can be f/ued with PCP and started 2 weeks after acute fracture - Appreciate ortho recs: OR today - Scheduled tylenol - Oxy IR PRN for pain  Normocytic anemia Admit hgb 9. Received 2 units since admission in anticipation for orthopedic surgery. Am hgb 10.1 - Trend cbc  Breakthrough COVID+  Current not requiring supplemental oxygen. Afebrile. Patient and family at home all vaccinated. Per discussion with pharmacy, patient would be eligible for monoclonal antibody if symptom occur within 10 days, and if viral load is high. Will consult [facility] for additional  information. - Will f/u w/ PCP regarding vaccination  - Monitor for symptoms  Acute Kidney Injury 2/2 dehydration Admit bun 29, Creatinine 1.35. No hx of chronic kidney disease. Appeared dry on arrival. Improving with fluids. Creatinine 1.35->1.27 - C/w NS 125cc/hr - Trend renal fx - Avoid nephrotoxic meds when able  Hypothyroidism On 11mcg levothyroxine at home - C/w home meds  DVT prophx: scd Diet: HH Bowel: Miralax Code: Full  Prior to Admission Living Arrangement: Home Anticipated Discharge Location: Snf Barriers to Discharge: Medical treatment Dispo: Anticipated discharge in approximately 2-3 day(s).   Patrick Anis, MD 04/13/2020, 6:37 AM Pager: [phone] After 5pm on weekdays and 1pm on weekends: On Call Pager: [phone]

## 2020-04-13 NOTE — Anesthesia Postprocedure Evaluation (Signed)
Anesthesia Post Note  Patient: Patrick Rhodes  Procedure(s) Performed: INTRAMEDULLARY (IM) NAIL INTERTROCHANTRIC RIGHT FEMORAL NAIL (Right )     Patient location during evaluation: Other Anesthesia Type: General Level of consciousness: awake and alert and confused Pain management: pain level controlled Vital Signs Assessment: post-procedure vital signs reviewed and stable Respiratory status: spontaneous breathing, nonlabored ventilation and respiratory function stable Cardiovascular status: blood pressure returned to baseline Postop Assessment: no apparent nausea or vomiting Anesthetic complications: no Comments: Recovered in OR due to positive Covid status.    No complications documented.  Last Vitals:  Vitals:   04/13/20 1305 04/13/20 1320  BP: (!) 152/83 (!) 152/81  Pulse: 78 80  Resp: 18 19  Temp: 36.4 C   SpO2: 100% 99%    Last Pain:  Vitals:   04/13/20 1305  TempSrc:   PainSc: [place]

## 2020-04-13 NOTE — H&P (Signed)
H&P update  The surgical history has been reviewed and remains accurate without interval change.  The patient was re-examined and patient's physiologic condition has not changed significantly in the last 30 days. The condition still exists that makes this procedure necessary. The treatment plan remains the same, without new options for care.  No new pharmacological allergies or types of therapy has been initiated that would change the plan or the appropriateness of the plan.  The patient and/or family understand the potential benefits and risks.  Cashius Grandstaff P. Stann Mainland, MD 04/13/2020 7:17 AM

## 2020-04-13 NOTE — Anesthesia Procedure Notes (Signed)
Procedure Name: Intubation Date/Time: 04/13/2020 11:15 AM Performed by: Amadeo Garnet, CRNA Pre-anesthesia Checklist: Patient identified, Emergency Drugs available, Suction available and Patient being monitored Patient Re-evaluated:Patient Re-evaluated prior to induction Oxygen Delivery Method: Circle system utilized Preoxygenation: Pre-oxygenation with 100% oxygen Induction Type: IV induction and Rapid sequence Ventilation: Unable to mask ventilate Laryngoscope Size: Mac and 4 Grade View: Grade I Tube type: Oral Tube size: 7.5 mm Number of attempts: 1 Airway Equipment and Method: Stylet Placement Confirmation: positive ETCO2,  ETT inserted through vocal cords under direct vision and breath sounds checked- equal and bilateral Secured at: 22 cm Tube secured with: Tape Dental Injury: Teeth and Oropharynx as per pre-operative assessment

## 2020-04-13 NOTE — ED Notes (Signed)
Please call Marjorie Smolder @ [phone] a status update--Patrick Rhodes

## 2020-04-13 NOTE — Brief Op Note (Signed)
04/13/2020  12:31 PM  PATIENT:  Patrick Rhodes  86 y.o. male  PRE-OPERATIVE DIAGNOSIS:  right intertrochanteric hip  POST-OPERATIVE DIAGNOSIS:  right intertrochanteric hip  PROCEDURE:  Procedure(s): INTRAMEDULLARY (IM) NAIL INTERTROCHANTRIC RIGHT FEMORAL NAIL (Right)  SURGEON:  Surgeon(s) and Role:    * Nicholes Stairs, MD - Primary  PHYSICIAN ASSISTANT:   ASSISTANTS: Jonelle Sidle, PA-C   ANESTHESIA:   general  EBL: 50 cc   BLOOD ADMINISTERED:none  DRAINS: none   LOCAL MEDICATIONS USED:  NONE  SPECIMEN:  No Specimen  DISPOSITION OF SPECIMEN:  N/A  COUNTS:  YES  TOURNIQUET:  * No tourniquets in log *  DICTATION: .Note written in EPIC  PLAN OF CARE: Admit to inpatient   PATIENT DISPOSITION:  PACU - hemodynamically stable.   Delay start of Pharmacological VTE agent (>24hrs) due to surgical blood loss or risk of bleeding: not applicable

## 2020-04-13 NOTE — Transfer of Care (Signed)
Immediate Anesthesia Transfer of Care Note  Patient: Patrick Rhodes  Procedure(s) Performed: INTRAMEDULLARY (IM) NAIL INTERTROCHANTRIC RIGHT FEMORAL NAIL (Right )  Patient Location: recovery in OR 7 due to covid status  Anesthesia Type:General  Level of Consciousness: alert  and confused  Airway & Oxygen Therapy: Patient Spontanous Breathing and Patient connected to face mask oxygen  Post-op Assessment: Report given to RN, Post -op Vital signs reviewed and stable and Patient moving all extremities  Post vital signs: Reviewed and stable  Last Vitals:  Vitals Value Taken Time  BP    Temp    Pulse    Resp    SpO2      Last Pain:  Vitals:   04/13/20 0721  TempSrc:   PainSc: 6          Complications: No complications documented.

## 2020-04-13 NOTE — Anesthesia Preprocedure Evaluation (Addendum)
Anesthesia Evaluation  Patient identified by MRN, date of birth, ID band Patient awake and Patient confused    Reviewed: Allergy & Precautions, NPO status , Patient's Chart, lab work & pertinent test results  History of Anesthesia Complications Negative for: history of anesthetic complications  Airway Mallampati: II  TM Distance: >3 FB Neck ROM: Full    Dental  (+) Edentulous Upper, Edentulous Lower   Pulmonary neg pulmonary ROS,    Pulmonary exam normal        Cardiovascular hypertension, Pt. on medications Normal cardiovascular exam  TTE 09/2018: EF 60-65%, impaired relaxation, small pericardial effusion, mild-moderate TR, mild AR, mild PR     Neuro/Psych Dementia CVA    GI/Hepatic negative GI ROS, Neg liver ROS,   Endo/Other  Hypothyroidism   Renal/GU Cr 1.35  negative genitourinary   Musculoskeletal right intertrochanteric hip fracture   Abdominal   Peds  Hematology  (+) anemia , Hgb 9.9, plt 160   Anesthesia Other Findings Day of surgery medications reviewed with patient.  Reproductive/Obstetrics negative OB ROS                            Anesthesia Physical Anesthesia Plan  ASA: III and emergent  Anesthesia Plan: General   Post-op Pain Management:    Induction: Intravenous and Rapid sequence  PONV Risk Score and Plan: 3 and Treatment may vary due to age or medical condition, Ondansetron and Dexamethasone  Airway Management Planned: Oral ETT  Additional Equipment: None  Intra-op Plan:   Post-operative Plan: Extubation in OR  Informed Consent: I have reviewed the patients History and Physical, chart, labs and discussed the procedure including the risks, benefits and alternatives for the proposed anesthesia with the patient or authorized representative who has indicated his/her understanding and acceptance.     Dental advisory given and History available from chart  only  Plan Discussed with: CRNA  Anesthesia Plan Comments:        Anesthesia Quick Evaluation

## 2020-04-14 ENCOUNTER — Encounter (HOSPITAL_COMMUNITY): Payer: Self-pay | Admitting: Orthopedic Surgery

## 2020-04-14 MED ORDER — ENOXAPARIN SODIUM 40 MG/0.4ML ~~LOC~~ SOLN
40.0000 mg | SUBCUTANEOUS | Status: DC
  Administered 2020-04-14 – 2020-04-16 (×3): 40 mg via SUBCUTANEOUS
  Filled 2020-04-14 (×3): qty 0.4

## 2020-04-14 MED ORDER — HYDROMORPHONE HCL 1 MG/ML IJ SOLN: 0.5000 mg | Freq: Three times a day (TID) | INTRAMUSCULAR | Status: DC | PRN

## 2020-04-14 NOTE — Progress Notes (Signed)
PT Cancellation Note  Patient Details Name: Patrick Rhodes MRN: [id number] DOB: 28-Mar-1930   Cancelled Treatment:    Reason Eval/Treat Not Completed: Active bedrest order from pre-op. Please remove if appropriate to allow for PT Evaluation. Will check back as schedule permits.  Mabeline Caras, PT, DPT Acute Rehabilitation Services  Pager [phone] Office [place] 04/14/2020, 11:17 AM

## 2020-04-14 NOTE — Progress Notes (Signed)
   04/13/20 2031  Assess: MEWS Score  Temp 98.4 F (36.9 C)  BP (!) 117/51  Pulse Rate (!) 105  ECG Heart Rate (!) 108  Resp 20  SpO2 93 %  O2 Device Room Air  Patient Activity (if Appropriate) In bed (agitated)

## 2020-04-14 NOTE — TOC CAGE-AID Note (Signed)
Transition of Care [facility]) - CAGE-AID Screening   Patient Details  Name: Patrick Rhodes MRN: [id number] Date of Birth: 05/18/1930  Transition of Care [facility] CM/SW Contact:    Emeterio Reeve, [place] Phone Number: 04/14/2020, 10:58 AM   Clinical Narrative:  Pt was unable to participate in assessment due to only being oriented to person.   CAGE-AID Screening: Substance Abuse Screening unable to be completed due to: : Patient unable to participate            Providence [place] Clinical Social Worker [phone]

## 2020-04-14 NOTE — Plan of Care (Signed)
  Problem: Education: Goal: Knowledge of General Education information will improve Description: Including pain rating scale, medication(s)/side effects and non-pharmacologic comfort measures 04/14/2020 1155 by Lurline Idol, RN Outcome: Progressing 04/14/2020 1155 by Lurline Idol, RN Outcome: Progressing   Problem: Health Behavior/Discharge Planning: Goal: Ability to manage health-related needs will improve 04/14/2020 1155 by Lurline Idol, RN Outcome: Progressing 04/14/2020 1155 by Lurline Idol, RN Outcome: Progressing   Problem: Clinical Measurements: Goal: Ability to maintain clinical measurements within normal limits will improve 04/14/2020 1155 by Lurline Idol, RN Outcome: Progressing 04/14/2020 1155 by Lurline Idol, RN Outcome: Progressing Goal: Will remain free from infection 04/14/2020 1155 by Lurline Idol, RN Outcome: Progressing 04/14/2020 1155 by Lurline Idol, RN Outcome: Progressing Goal: Diagnostic test results will improve 04/14/2020 1155 by Lurline Idol, RN Outcome: Progressing 04/14/2020 1155 by Lurline Idol, RN Outcome: Progressing Goal: Respiratory complications will improve 04/14/2020 1155 by Lurline Idol, RN Outcome: Progressing 04/14/2020 1155 by Lurline Idol, RN Outcome: Progressing Goal: Cardiovascular complication will be avoided 04/14/2020 1155 by Lurline Idol, RN Outcome: Progressing 04/14/2020 1155 by Lurline Idol, RN Outcome: Progressing   Problem: Activity: Goal: Risk for activity intolerance will decrease 04/14/2020 1155 by Lurline Idol, RN Outcome: Progressing 04/14/2020 1155 by Lurline Idol, RN Outcome: Progressing   Problem: Nutrition: Goal: Adequate nutrition will be maintained 04/14/2020 1155 by Lurline Idol, RN Outcome: Progressing 04/14/2020 1155 by Lurline Idol, RN Outcome: Progressing   Problem: Coping: Goal: Level of anxiety will decrease 04/14/2020 1155 by Lurline Idol, RN Outcome:  Progressing 04/14/2020 1155 by Lurline Idol, RN Outcome: Progressing   Problem: Elimination: Goal: Will not experience complications related to bowel motility 04/14/2020 1155 by Lurline Idol, RN Outcome: Progressing 04/14/2020 1155 by Lurline Idol, RN Outcome: Progressing Goal: Will not experience complications related to urinary retention 04/14/2020 1155 by Lurline Idol, RN Outcome: Progressing 04/14/2020 1155 by Lurline Idol, RN Outcome: Progressing   Problem: Pain Managment: Goal: General experience of comfort will improve 04/14/2020 1155 by Lurline Idol, RN Outcome: Progressing 04/14/2020 1155 by Lurline Idol, RN Outcome: Progressing   Problem: Safety: Goal: Ability to remain free from injury will improve 04/14/2020 1155 by Lurline Idol, RN Outcome: Progressing 04/14/2020 1155 by Lurline Idol, RN Outcome: Progressing   Problem: Skin Integrity: Goal: Risk for impaired skin integrity will decrease 04/14/2020 1155 by Lurline Idol, RN Outcome: Progressing 04/14/2020 1155 by Lurline Idol, RN Outcome: Progressing   Problem: Education: Goal: Knowledge of risk factors and measures for prevention of condition will improve 04/14/2020 1155 by Lurline Idol, RN Outcome: Progressing 04/14/2020 1155 by Lurline Idol, RN Outcome: Progressing   Problem: Coping: Goal: Psychosocial and spiritual needs will be supported 04/14/2020 1155 by Lurline Idol, RN Outcome: Progressing 04/14/2020 1155 by Lurline Idol, RN Outcome: Progressing   Problem: Respiratory: Goal: Will maintain a patent airway 04/14/2020 1155 by Lurline Idol, RN Outcome: Progressing 04/14/2020 1155 by Lurline Idol, RN Outcome: Progressing Goal: Complications related to the disease process, condition or treatment will be avoided or minimized 04/14/2020 1155 by Lurline Idol, RN Outcome: Progressing 04/14/2020 1155 by Lurline Idol, RN Outcome: Progressing

## 2020-04-14 NOTE — Plan of Care (Signed)
  Problem: Education: Goal: Knowledge of General Education information will improve Description: Including pain rating scale, medication(s)/side effects and non-pharmacologic comfort measures Outcome: Not Progressing   Problem: Activity: Goal: Risk for activity intolerance will decrease Outcome: Not Progressing   Problem: Nutrition: Goal: Adequate nutrition will be maintained Outcome: Not Progressing   

## 2020-04-14 NOTE — Evaluation (Signed)
Physical Therapy Evaluation Patient Details Name: Patrick Rhodes MRN: [id number] DOB: 03-25-1930 Today's Date: 04/14/2020   History of Present Illness  Pt is an 86 y.o. male admited 04/11/20 after unwitnessed fall at home. Pt found to have R hip comminuted intertrochanteric fx. S/p R hip IMN 8/23. Pt also found to be (+) COVID; pt fully vaccinated. PMH includes CVAs with residual weakness, vascular dementia, HTN.    Clinical Impression  Pt presents with an overall decrease in functional mobility secondary to above. Pt poor historian with h/o dementia; per chart, son reports pt ambulatory with [facility], admission due to unwitnessed fall. Today, pt required max to totalA for mobility and ADLs. Poor tolerance to RLE PROM; positioned to encourage neutral rotation and knee extension. Will required lift equipment for OOB activity. Pt would benefit from continued acute PT services to maximize functional mobility and independence prior to d/c with SNF-level therapies.     Follow Up Recommendations SNF;Supervision/Assistance - 24 hour    Equipment Recommendations   (defer)    Recommendations for Other Services       Precautions / Restrictions Precautions Precautions: Fall;Other (comment) Precaution Comments: Incontinence Restrictions Weight Bearing Restrictions: Yes RLE Weight Bearing: Weight bearing as tolerated      Mobility  Bed Mobility Overal bed mobility: Needs Assistance Bed Mobility: Rolling Rolling: Max assist         General bed mobility comments: MaxA to roll R/L for pericare and linen change due to urine incontinence, pt limited by pain and actively resisting mobility, assisting with bed rails  Transfers                 General transfer comment: Deferred - will require +2 assist; recommend maximove lift for nursing staff  Ambulation/Gait                Stairs            Wheelchair Mobility    Modified Rankin (Stroke Patients Only)        Balance                                             Pertinent Vitals/Pain Pain Assessment: Faces Faces Pain Scale: Hurts even more Pain Location: RLE with mobility Pain Descriptors / Indicators: Grimacing;Guarding;Moaning Pain Intervention(s): Monitored during session;Limited activity within patient's tolerance;Repositioned    Home Living Family/patient expects to be discharged to:: Skilled nursing facility                 Additional Comments: Pt disoriented (h/o dementia) and unreliable historian    Prior Function           Comments: Per chart, son reports pt ambulatory with [facility], uses BSC, has family assist; admission due to fall likely off BSC when pt trying to pull pants up     Hand Dominance        Extremity/Trunk Assessment   Upper Extremity Assessment Upper Extremity Assessment: Generalized weakness;Difficult to assess due to impaired cognition (did not observe pt flex/abd shoulders >90')    Lower Extremity Assessment Lower Extremity Assessment: Generalized weakness;RLE deficits/detail;Difficult to assess due to impaired cognition RLE Deficits / Details: s/p R hip IMN; minimal muscle activation noted in hip/knee flex/ext; pt with some tolerance to hip and knee PROM RLE: Unable to fully assess due to pain RLE Coordination: decreased gross motor;decreased fine motor  Communication   Communication: HOH  Cognition Arousal/Alertness: Awake/alert Behavior During Therapy: Flat affect Overall Cognitive Status: History of cognitive impairments - at baseline                                 General Comments: Per chart, h/o dementia. Pt inconsistently following one step commands, actively resisting attempts at mobility, alert to himself      General Comments General comments (skin integrity, edema, etc.): Pt noted to have food in mouth, was able to spit out a least a tablespoon of rice, seemed to be pocketing in L-side  cheek; NT notified and aware pt is a feeder. VSS on RA    Exercises Other Exercises Other Exercises: PROM hip flex, hip add/abd, knee flex/ext; left RLE resting in hip IR/ADD with knee ext   Assessment/Plan    PT Assessment Patient needs continued PT services  PT Problem List Decreased strength;Decreased range of motion;Decreased activity tolerance;Decreased balance;Decreased mobility;Decreased cognition;Decreased knowledge of use of DME;Decreased knowledge of precautions;Pain       PT Treatment Interventions DME instruction;Gait training;Functional mobility training;Therapeutic activities;Therapeutic exercise;Balance training;Cognitive remediation;Patient/family education;Wheelchair mobility training    PT Goals (Current goals can be found in the Care Plan section)  Acute Rehab PT Goals Patient Stated Goal: Decreased RLE pain PT Goal Formulation: With patient Time For Goal Achievement: 04/28/20 Potential to Achieve Goals: Fair    Frequency Min 3X/week   Barriers to discharge        Co-evaluation               AM-PAC PT "6 Clicks" Mobility  Outcome Measure Help needed turning from your back to your side while in a flat bed without using bedrails?: A Lot Help needed moving from lying on your back to sitting on the side of a flat bed without using bedrails?: Total Help needed moving to and from a bed to a chair (including a wheelchair)?: Total Help needed standing up from a chair using your arms (e.g., wheelchair or bedside chair)?: Total Help needed to walk in hospital room?: Total Help needed climbing 3-5 steps with a railing? : Total 6 Click Score: 7    End of Session   Activity Tolerance: Patient limited by pain Patient left: in bed;with call bell/phone within reach;with bed alarm set Nurse Communication: Mobility status PT Visit Diagnosis: Other abnormalities of gait and mobility (R26.89);Muscle weakness (generalized) (M62.81);Pain Pain - Right/Left: Right Pain  - part of body: Hip    Time: 1416-1440 PT Time Calculation (min) (ACUTE ONLY): 24 min   Charges:   PT Evaluation $PT Eval Moderate Complexity: 1 Mod PT Treatments $Therapeutic Activity: 8-22 mins       Mabeline Caras, PT, DPT Acute Rehabilitation Services  Pager [phone] [facility] 04/14/2020, 4:29 PM

## 2020-04-14 NOTE — Progress Notes (Signed)
  Date: 04/14/2020  Patient name: Patrick Rhodes  Medical record number: [id number]  Date of birth: June 07, 1930    Subjective: Patient is confused this a.m. and is calling out to the nurses.  Patient also had episodes of tachycardia to 100s.  He is oriented to self but is unable to answer questions.  Objective:  Vital signs in last 24 hours: Vitals:   04/14/20 0408 04/14/20 0805 04/14/20 1000 04/14/20 1400  BP:  (!) 138/94    Pulse:  76 86 64  Resp:  18 17 12   Temp:  99.1 F (37.3 C)    TempSrc: Axillary Oral    SpO2:  93% 91% 96%  Weight:       General: Awake, alert, confused, calling out CVS: Tachycardic, normal heart sounds Lungs: CTA bilaterally Abdomen: Soft, nontender, nondistended, normoactive bowel sounds Extremities: Right lower extremity dressing intact, swelling noted over upper thigh but no evidence of ecchymosis HEENT: Normocephalic, atraumatic Skin: Warm and dry Neuro: Confused, unable to follow commands or answer questions  Assessment/Plan:  Active Problems:   Hip fracture ([place])  1.  Right comminuted intertrochanteric fracture: -Patient is initially admitted to the hospital with an unwitnessed fall and is found to have a right femur comminuted intertrochanteric fracture. -Ortho follow-up and recommendations appreciated. -Patient is status post IM nail right femur -I suspect the patient's pain is not well controlled as he is constantly pointing to his right leg.  Case discussed with RN at bedside.  Patient is written for Dilaudid 0.5 mg IV as needed for more severe pain.  He has only received 1 dose of oxycodone today. -PT recommending SNF placement -Patient will need to start bisphosphonate as an outpatient. -No further work-up at this time.  2.  Tachycardia: -I suspect the patient's tachycardia is likely secondary to pain from his recent femur fracture which was repaired yesterday -We will attempt to control the patient's pain better and monitor his  tachycardia -We will continue telemetry monitoring for now -No further work-up at this time  3.  Covid positive infection -Patient was vaccinated against Covid but was incidentally noted to be Covid positive on this admission -Patient has no symptoms of Covid currently.  He is not hypoxic nor dyspneic have any cough or shortness of breath. -We will continue to monitor closely -No further work-up at this time  4.  AKI: -Patient presented to the ED and was found to have a creatinine of 1.35.  Patient's baseline creatinine is around 0.6-0.8. -Patient received IV fluids. -We will repeat BMP tomorrow. -No further work-up at this time  5. Hypothyroidism On 87mcg levothyroxine at home - C/w home meds  6. DVT prophx:Lovenox   Prior to Admission Living Arrangement: Anticipated Discharge Location: Barriers to Discharge: Dispo: Anticipated discharge in approximately 2-3 day(s).   Aldine Contes, MD 04/14/2020, 4:44 PM  After 5pm on weekdays and 1pm on weekends: On Call pager [phone]

## 2020-04-15 LAB — BASIC METABOLIC PANEL
Anion gap: 9 (ref 5–15)
BUN: 26 mg/dL — ABNORMAL HIGH (ref 8–23)
CO2: 22 mmol/L (ref 22–32)
Calcium: 9.4 mg/dL (ref 8.9–10.3)
Chloride: 110 mmol/L (ref 98–111)
Creatinine, Ser: 0.91 mg/dL (ref 0.61–1.24)
GFR calc Af Amer: >60 mL/min (ref >60)
GFR calc non Af Amer: >60 mL/min (ref >60)
Glucose, Bld: 108 mg/dL — ABNORMAL HIGH (ref 70–99)
Potassium: 3.6 mmol/L (ref 3.5–5.1)
Sodium: 141 mmol/L (ref 135–145)

## 2020-04-15 LAB — CBC
HCT: 23.4 % — ABNORMAL LOW (ref 39.0–52.0)
Hemoglobin: 7.4 g/dL — ABNORMAL LOW (ref 13.0–17.0)
MCH: 28.7 pg (ref 26.0–34.0)
MCHC: 31.6 g/dL (ref 30.0–36.0)
MCV: 90.7 fL (ref 80.0–100.0)
Platelets: 160 10*3/uL (ref 150–400)
RBC: 2.58 MIL/uL — ABNORMAL LOW (ref 4.22–5.81)
RDW: 13.7 % (ref 11.5–15.5)
WBC: 7 10*3/uL (ref 4.0–10.5)
nRBC: 0.3 % — ABNORMAL HIGH (ref 0.0–0.2)

## 2020-04-15 LAB — MAGNESIUM: Magnesium: 1.9 mg/dL (ref 1.7–2.4)

## 2020-04-15 MED ORDER — ACETAMINOPHEN 500 MG PO TABS
1000.0000 mg | ORAL_TABLET | Freq: Four times a day (QID) | ORAL | Status: DC
  Administered 2020-04-15 – 2020-04-17 (×7): 1000 mg via ORAL
  Filled 2020-04-15 (×7): qty 2

## 2020-04-15 MED ORDER — POTASSIUM CHLORIDE 20 MEQ/15ML (10%) PO SOLN
40.0000 meq | Freq: Once | ORAL | Status: AC
  Administered 2020-04-15: 40 meq via ORAL
  Filled 2020-04-15: qty 30

## 2020-04-15 MED ORDER — METOPROLOL TARTRATE 25 MG PO TABS
25.0000 mg | ORAL_TABLET | Freq: Two times a day (BID) | ORAL | Status: DC
  Administered 2020-04-15 – 2020-04-17 (×5): 25 mg via ORAL
  Filled 2020-04-15 (×5): qty 1

## 2020-04-15 MED ORDER — POTASSIUM CHLORIDE CRYS ER 20 MEQ PO TBCR: 40.0000 meq | EXTENDED_RELEASE_TABLET | Freq: Once | ORAL | Status: DC

## 2020-04-15 NOTE — Progress Notes (Addendum)
   Subjective:  Patrick Rhodes is a 86 y.o. with PMH of CVA, Hypothyroidism, HTN admit for hip fracture on hospital day 3   Patrick Rhodes was examined and evaluated at bedside this am. He was unable to follow directions. Endorsing pain of his R hip. He was able to answer orientation question with AAOx2 to name and location.  Objective:  Vital signs in last 24 hours: Vitals:   04/14/20 1400 04/14/20 1654 04/14/20 2112 04/15/20 0500  BP:  127/70 (!) 110/50 (!) 168/80  Pulse: 64 78 63   Resp: 12 16 12    Temp:  98.8 F (37.1 C) 98.5 F (36.9 C) 98.1 F (36.7 C)  TempSrc:  Oral Oral Oral  SpO2: 96% 90% 92% 90%  Weight:       Gen: Well-developed, chornically ill-appearing, NAD HEENT: NCAT head, hearing intact, EOMI, MMM Pulm: Breathing comfortably on room air, no cough, no distress  Skin: Dry, Warm, Right hip with fresh bandaging, no notable surrounding discharge or erythema Neuro: AAOx2  Assessment/Plan:  Active Problems:   Hip fracture (HCC)  Patrick Rhodes is a 86 y.o. with PMH of CVA, Hypothyroidism, HTN, Dementia admit for R hip fracture after fall  Right femur comminuted intertrochanteric fracture Osteoporosis Initially presented after unwitnessed fall. Unclear history for etiology but CT head negative for brain bleeds. Ortho on board s/ IM nail placement yesterday. Would be eligible for bisphosphonate therapy due to evidence of fragility fracture with this admission. Can be f/ued with PCP and started 2 weeks after acute fracture. Yesterday started on scheduled tylenol for inadequate pain control. Will up-titrate scheduled regimen as patient unable to communicate need. - Appreciate ortho recs - Up-titrate schedule tylenol to 1g q6hr - Oxy IR PRN for pain - IV dilaudid for breakthrough  - PT/OT recommend SNF: [facility] to take over rehab at discharge w/ home PT  Normocytic anemia Pre-op hgb 10.1 No further labs drawn since surgery. Need hgb to assess - F/u cbc -  Transfuse if <7  Breakthrough COVID+  Current not requiring supplemental oxygen. Afebrile. Patient and family at home all vaccinated. Per discussion with pharmacy, patient not eligible for monoclonal antibody due to CT count of 37.2. Found to have been vaccinated with Moderna 10/03/19, 11/09/19. Does not appear to be symptomatic.  - F/u CRP, d-dimer - Monitor for symptoms  Acute Kidney Injury 2/2 dehydration Admit bun 29, Creatinine 1.35. No hx of chronic kidney disease. Appeared dry on arrival. Improving with fluids. Creatinine 1.35->1.27. Am labs pending - C/w NS 125cc/hr - Trend renal fx - Avoid nephrotoxic meds when able  Hypothyroidism On 34mcg levothyroxine at home - C/w home meds  DVT prophx: lovenox Diet: HH Bowel: Miralax Code: Full  Prior to Admission Living Arrangement: Home Anticipated Discharge Location: Snf Barriers to Discharge: Medical treatment Dispo: Anticipated discharge in approximately 2-3 day(s).   [name], MD 04/15/2020, 10:47 AM Pager: [phone] After 5pm on weekdays and 1pm on weekends: On Call Pager: [phone]

## 2020-04-15 NOTE — TOC Initial Note (Addendum)
Transition of Care [facility]) - Initial/Assessment Note    Patient Details  Name: Patrick Rhodes MRN: [id number] Date of Birth: [birth year]  Transition of Care [facility]) CM/SW Contact:    Patrick Halsted, LCSW Phone Number: 04/15/2020, 8:56 AM  Clinical Narrative:                 8:56am-CSW received consult for SNF placement. CSW left voicemail for patient's son, Patrick Rhodes. CSW spoke with [facility] to determine if patient is eligible for SNF since their contracted facilities do not accept COVID positive patients. [facility] SW, Patrick Rhodes, reports they can do a contract if one of the [place] facilities will accept that. CSW will follow up.  10:30 AM-Received call back from [name] with [facility]. They have decided to have patient return home at discharge and receive the therapy with [facility]. Patient's son in agreement. They will arrange services.    Expected Discharge Plan: Skilled Nursing Facility Barriers to Discharge: Continued Medical Work up   Patient Goals and CMS Choice Patient states their goals for this hospitalization and ongoing recovery are:: Rehab      Expected Discharge Plan and Services Expected Discharge Plan: [place] In-house Referral: Clinical Social Work     Living arrangements for the past 2 months: [place]                                      Prior Living Arrangements/Services Living arrangements for the past 2 months: Single Family Home Lives with:: Adult Children Patient language and need for interpreter reviewed:: Yes Do you feel safe going back to the place where you live?: Yes      Need for Family Participation in Patient Care: Yes (Comment) Care giver support system in place?: Yes (comment) Current home services: Other (comment) ([facility]) Criminal Activity/Legal Involvement Pertinent to Current Situation/Hospitalization: No - Comment as needed  Activities of Daily Living      Permission Sought/Granted Permission sought to share information with :  Facility Sport and exercise psychologist, Family Supports Permission granted to share information with : No  Share Information with NAME: Patrick Rhodes  Permission granted to share info w AGENCY: SNFs/[facility]  Permission granted to share info w Relationship: Son  Permission granted to share info w Contact Information: [phone]  Emotional Assessment   Attitude/Demeanor/Rapport: Unable to Assess Affect (typically observed): Unable to Assess Orientation: :  (Disoriented) Alcohol / Substance Use: Not Applicable Psych Involvement: No (comment)  Admission diagnosis:  Hip fracture ([place]) [S72.009A] Closed comminuted intertrochanteric fracture of proximal end of right femur ([place]) [S72.141A] Displaced intertrochanteric fracture of right femur, initial encounter for closed fracture ([place]) [S72.141A] COVID-19 [U07.1] Patient Active Problem List   Diagnosis Date Noted  . Hip fracture ([place]) 04/12/2020  . 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 Pneumonia of left lower lobe due to infectious organism   . Infectious encephalopathy   . Hypercalcemia   . Sepsis ([place]) 10/13/2018   PCP:  System, Pcp Not In Pharmacy:   [id number] [address], [place] [address] Phone: [phone] Fax: [phone]     Social Determinants of Health (SDOH) Interventions    Readmission Risk Interventions No flowsheet data found.

## 2020-04-15 NOTE — Progress Notes (Signed)
Paged by nursing staff regarding tachycardia. Examined and evaluated Patrick Rhodes at bedside. Noted to be disoriented, which is his baseline and unable to describe need. Nursing staff at bedside mentions giving additional pain med in case it was due to uncontrolled pain but HR has not changed. Noted to have HR in 100-110s. Reviewed rate on telemetry and rhythm noted to be in sinus tachycardia w/ very frequent PVC burden. Also noted run of SVTs earlier in the day.   A/P Sinus tachycardia with significant PVC burden. Am labs delayed due to inability to get blood. Need to r/o electrolyte abnormalities. BP stable at 027X systolic. No hx of systolic heart failure or atrial enlargement. Does have diastolic dysfunction on prior Echo. Start beta-blocker therapy  - 12-lead EKG - Stat bmp, mag - Metoprolol 25mg  BID - C/w telemetry

## 2020-04-15 NOTE — Progress Notes (Signed)
  Pharmacy COVID-19 Monoclonal Antibody Screening  Patrick Rhodes was identified as being not hospitalized with symptoms from Covid-19 on admission but an incidental positive PCR has been documented.  The patient may qualify for the use of monoclonal antibodies (mAB) for COVID-19 viral infection to prevent worsening symptoms stemming from Covid-19 infection.  The patient was identified based on a positive COVID-19 PCR and not requiring the use of supplemental oxygen at this time.  This patient meets the FDA criteria for Emergency Use Authorization of casirivimab/imdevimab.  Has a (+) direct SARS-CoV-2 viral test result   Is NOT hospitalized due to COVID-19  Is within 10 days of symptom onset  Has at least one of the high risk factor(s) for progression to severe COVID-19 and/or hospitalization as defined in EUA. Specific high risk criteria: Age >31, Hx CVA, HTN  Additionally: The patient has no had a positive COVID-19 PCR in the last 90 days The patient is fully vaccinated against COVID-19. Cycle time per lab is 37.2   Since the patient is (select one below): The patient does not meet criteria for mAB administration due to being partially or fully vaccinated for COVID-19, asymptomatic, with a cycle time > 32  This eligibility and indication for treatment was discussed with the patient's physician: Dr. Gilberto Better  Plan: Based on the above discussion, it was decided that the patient will not receive a dose of mAb combination   Benetta Spar, PharmD, BCPS, [facility] Clinical Pharmacist  Please check AMION for all [place] phone numbers After 10:00 PM, call [place] [phone]

## 2020-04-16 LAB — BASIC METABOLIC PANEL
Anion gap: 9 (ref 5–15)
BUN: 23 mg/dL (ref 8–23)
CO2: 23 mmol/L (ref 22–32)
Calcium: 9.6 mg/dL (ref 8.9–10.3)
Chloride: 110 mmol/L (ref 98–111)
Creatinine, Ser: 0.91 mg/dL (ref 0.61–1.24)
GFR calc Af Amer: >60 mL/min (ref >60)
GFR calc non Af Amer: >60 mL/min (ref >60)
Glucose, Bld: 87 mg/dL (ref 70–99)
Potassium: 4.2 mmol/L (ref 3.5–5.1)
Sodium: 142 mmol/L (ref 135–145)

## 2020-04-16 LAB — CBC
HCT: 23.3 % — ABNORMAL LOW (ref 39.0–52.0)
Hemoglobin: 7.7 g/dL — ABNORMAL LOW (ref 13.0–17.0)
MCH: 29.8 pg (ref 26.0–34.0)
MCHC: 33 g/dL (ref 30.0–36.0)
MCV: 90.3 fL (ref 80.0–100.0)
Platelets: UNDETERMINED 10*3/uL (ref 150–400)
RBC: 2.58 MIL/uL — ABNORMAL LOW (ref 4.22–5.81)
RDW: 13.9 % (ref 11.5–15.5)
WBC: 7.9 10*3/uL (ref 4.0–10.5)
nRBC: 0.3 % — ABNORMAL HIGH (ref 0.0–0.2)

## 2020-04-16 MED ORDER — HYDROXYZINE HCL 10 MG PO TABS
10.0000 mg | ORAL_TABLET | Freq: Once | ORAL | Status: AC
  Administered 2020-04-16: 10 mg via ORAL
  Filled 2020-04-16: qty 1

## 2020-04-16 NOTE — Progress Notes (Signed)
Physical Therapy Treatment Patient Details Name: Patrick Rhodes MRN: [id number] DOB: 06-03-1930 Today's Date: 04/16/2020    History of Present Illness Pt is an 86 y.o. male admited 04/11/20 after unwitnessed fall at home. Pt found to have R hip comminuted intertrochanteric fx. S/p R hip IMN 8/23. Pt also found to be (+) COVID; pt fully vaccinated. PMH includes CVAs with residual weakness, vascular dementia, HTN.   PT Comments    Pt slowly progressing with mobility. Pt with improved tolerance to RLE PROM, but continues to actively resist all mobility, although pt demonstrates good BUE/core strength able to reposition himself in bed, just won't do so to command. Spoke with son on phone while in pt's room regarding d/c plans. Son feels confident having pt return home with PACE/HH services and increased DME; verified DME and assist-level needs. Pt enjoyed chatting with son. If pt to remain admitted, will continue to follow acutely.    Follow Up Recommendations  SNF;Supervision/Assistance - 24 hour (son declined, going home with PACE/HH services)     Equipment Recommendations   (hoyer lift, hospital bed, wheelchair, rolling walker)    Recommendations for Other Services       Precautions / Restrictions Precautions Precautions: Fall;Other (comment) Precaution Comments: Incontinence, resistant to all movement Restrictions Weight Bearing Restrictions: Yes RLE Weight Bearing: Weight bearing as tolerated    Mobility  Bed Mobility Overal bed mobility: Needs Assistance Bed Mobility: Rolling Rolling: Max assist         General bed mobility comments: Pt repositioning himself well in bed but not to command, can minimally bridge L hip to scoot laterally and partially come to long sitting; maxA to roll R/L for checking incontinence  Transfers                 General transfer comment: Unable, pt actively resisting mobility attempts  Ambulation/Gait                 Stairs              Wheelchair Mobility    Modified Rankin (Stroke Patients Only)       Balance Overall balance assessment: Needs assistance                                          Cognition Arousal/Alertness: Awake/alert Behavior During Therapy: Restless Overall Cognitive Status: History of cognitive impairments - at baseline                                 General Comments: Spoke with son who verifies pt w/ baseline dementia "stubborn and reluctant to work with anyone but me" per son. Pt more alert and slightly more appropriate talking on phone with son. Continues to actively resist attempts at mobility      Exercises Other Exercises Other Exercises: RLE PROM with hip flex/ext/add/abd, knee flex/ext -- pt with improving tolerance to this    General Comments General comments (skin integrity, edema, etc.): Called pt's son while in room to discuss d/c planning and DME needs. Son confirms pt to return home with 24/7 assist from son and PACE services; PACE to provide hospital bed, hoyer lift and other needed DME. Son feels confident with d/c plans      Pertinent Vitals/Pain Pain Assessment: Faces Faces Pain Scale: Hurts even more Pain Location:  RLE with mobility Pain Descriptors / Indicators: Grimacing;Guarding;Moaning Pain Intervention(s): Monitored during session;Limited activity within patient's tolerance    Home Living                      Prior Function            PT Goals (current goals can now be found in the care plan section) Acute Rehab PT Goals Patient Stated Goal: Return home PT Goal Formulation: With patient/family Progress towards PT goals: Progressing toward goals (slowly)    Frequency    Min 3X/week      PT Plan Current plan remains appropriate    Co-evaluation              AM-PAC PT "6 Clicks" Mobility   Outcome Measure  Help needed turning from your back to your side while in a flat bed without  using bedrails?: A Lot Help needed moving from lying on your back to sitting on the side of a flat bed without using bedrails?: Total Help needed moving to and from a bed to a chair (including a wheelchair)?: Total Help needed standing up from a chair using your arms (e.g., wheelchair or bedside chair)?: Total Help needed to walk in hospital room?: Total Help needed climbing 3-5 steps with a railing? : Total 6 Click Score: 7    End of Session   Activity Tolerance: Patient limited by pain;Other (comment) (limited by cognitive impairment)   Nurse Communication: Mobility status;Need for lift equipment PT Visit Diagnosis: Other abnormalities of gait and mobility (R26.89);Muscle weakness (generalized) (M62.81);Pain Pain - Right/Left: Right Pain - part of body: Hip     Time: 4401-0272 PT Time Calculation (min) (ACUTE ONLY): 27 min  Charges:  $Therapeutic Exercise: 8-22 mins $Self Care/Home Management: 8-22                     Mabeline Caras, PT, DPT Acute Rehabilitation Services  Pager [phone] Office [place] 04/16/2020, 1:27 PM

## 2020-04-16 NOTE — Progress Notes (Signed)
   04/16/20 1558  Vitals  BP (!) 169/106    Patient is very agitated and says that we have been "taking blood from him all day". He is restless and will not stay still enough to get an accurate BP.

## 2020-04-16 NOTE — Progress Notes (Signed)
Two attempts were made to gain IV access for this patient. The patient was agitated and non compliant with directions given to remain still, the patient was also trying to get up out of the bed and was cursing. I secure chatted MD Marianna Payment who ordered atarax for this patient. RN Delana Meyer gave medication and I returned a little while after the medication was given in hopes to be able to place an IV for this patient. Unfortunately the medication did not help and the patient seems worse then before, he started hitting at myself and another staff member who was trying to help me hold the patient to place the IV, he then started cursing at [place] and telling us to "get out of his face and leave him the hell alone". I apologized to the patient and secure chatted MD Marianna Payment again making him aware of the patient's behavior once again, and letting him know access was not established.

## 2020-04-16 NOTE — Progress Notes (Signed)
   Subjective:  Chart Reviewed. Patient is 3 days s/p right hip IM Nail by Dr. Stann Mainland.   Patient being evaluated for intermittent tachycardia possibly due to uncontrolled pain.  PRN pain meds prescribed by the primary team.    Date of Surgery: 04/13/2020   PREOPERATIVE DIAGNOSIS: right hip fracture   POSTOPERATIVE DIAGNOSIS: Same   PROCEDURE: Treatment of intertrochanteric, pertrochanteric, subtrochanteric fracture with intramedullary implant. CPT [phone]   SURGEON: Dannielle Karvonen. Stann Mainland, M.D Objective:   VITALS:   Vitals:   04/16/20 0400 04/16/20 0518 04/16/20 0741 04/16/20 0825  BP:  135/89 (!) 161/71 (!) 116/56  Pulse: 89 91 87   Resp: 19 13 13  (!) 24  Temp:  97.9 F (36.6 C) 98.1 F (36.7 C)   TempSrc:  Oral Oral   SpO2: 100% 93% 95%   Weight:  65.8 kg      Exam deferred due to COVID status.   Lab Results  Component Value Date   WBC 7.9 04/16/2020   HGB 7.7 (L) 04/16/2020   HCT 23.3 (L) 04/16/2020   MCV 90.3 04/16/2020   PLT PLATELET CLUMPS NOTED ON SMEAR, UNABLE TO ESTIMATE 04/16/2020   BMET    Component Value Date/Time   NA 142 04/16/2020 0407   K 4.2 04/16/2020 0407   CL 110 04/16/2020 0407   CO2 23 04/16/2020 0407   GLUCOSE 87 04/16/2020 0407   BUN 23 04/16/2020 0407   CREATININE 0.91 04/16/2020 0407   CALCIUM 9.6 04/16/2020 0407   GFRNONAA >60 04/16/2020 0407   GFRAA >60 04/16/2020 0407     Assessment/Plan: 3 Days Post-Op   Active Problems:   Hip fracture ([place])   Up with therapy  Patient is status post right IM nail on 04/13/2020.   Weightbearing status:  weightbearing as tolerated.   Maintain dressings until follow up in 2 weeks status post surgery. Dressing changes as needed.   DVT Prophylaxis: Lovenox as prescribed by primary team.   Pain control: Tylenol as needed   Reach out for concerns or questions.   Faythe Casa 04/16/2020, 9:14 AM  Jonelle Sidle PA-C  Physician Assistant with Dr. Lillia Abed [place]

## 2020-04-16 NOTE — Progress Notes (Signed)
CCMD tech charted a HR of 140 which caused this patients MEWS to turn red, vitals were rechecked and they are stable. The MEWS is now green.

## 2020-04-16 NOTE — Progress Notes (Addendum)
HD#4 Subjective:  Overnight Events: Patient has had tachycardia with frequent PVC thought to be 2/2 to uncontrolled pain. Increased pain medication and started Beta-blocker.   Patient in bed working with PT. He continues to have a difficult time following directions and participating with physical therapy. He does admit to pain in his right hip. Still alert   Objective:  Vital signs in last 24 hours: Vitals:   04/16/20 0400 04/16/20 0518 04/16/20 0741 04/16/20 0825  BP:  135/89 (!) 161/71 (!) 116/56  Pulse: 89 91 87   Resp: 19 13 13  (!) 24  Temp:  97.9 F (36.6 C) 98.1 F (36.7 C)   TempSrc:  Oral Oral   SpO2: 100% 93% 95%   Weight:  65.8 kg     Supplemental O2: Room Air SpO2: 95 % O2 Flow Rate (L/min): n/a   Physical Exam:  Physical Exam HENT:     Head: Normocephalic and atraumatic.  Eyes:     Extraocular Movements: Extraocular movements intact.  Cardiovascular:     Rate and Rhythm: Tachycardia present.     Pulses: Normal pulses.     Heart sounds: Normal heart sounds. No murmur heard.   Pulmonary:     Effort: Pulmonary effort is normal. No respiratory distress.  Abdominal:     General: Abdomen is flat. There is no distension.     Tenderness: There is no abdominal tenderness.  Musculoskeletal:        General: Tenderness (right hip) present. No swelling (hip does not appear to have any significant swelling or erythema). Normal range of motion.     Cervical back: Normal range of motion.  Skin:    General: Skin is dry.  Neurological:     General: No focal deficit present.     Mental Status: He is alert. Mental status is at baseline.     Filed Weights   04/13/20 1543 04/16/20 0518  Weight: 64 kg 65.8 kg     Intake/Output Summary (Last 24 hours) at 04/16/2020 1133 Last data filed at 04/16/2020 0600 Gross per 24 hour  Intake 120 ml  Output 425 ml  Net -305 ml   Net IO Since Admission: 825 mL [04/16/20 1133]  Pertinent Labs: CBC Latest Ref Rng & Units  04/16/2020 04/15/2020 04/13/2020  WBC 4.0 - 10.5 K/uL 7.9 7.0 9.0  Hemoglobin 13.0 - 17.0 g/dL 7.7(L) 7.4(L) 10.1(L)  Hematocrit 39 - 52 % 23.3(L) 23.4(L) 30.7(L)  Platelets 150 - 400 K/uL PLATELET CLUMPS NOTED ON SMEAR, UNABLE TO ESTIMATE 160 148(L)    CMP Latest Ref Rng & Units 04/16/2020 04/15/2020 04/13/2020  Glucose 70 - 99 mg/dL 87 108(H) 117(H)  BUN 8 - 23 mg/dL 23 26(H) 33(H)  Creatinine 0.61 - 1.24 mg/dL 0.91 0.91 1.27(H)  Sodium 135 - 145 mmol/L 142 141 141  Potassium 3.5 - 5.1 mmol/L 4.2 3.6 4.2  Chloride 98 - 111 mmol/L 110 110 107  CO2 22 - 32 mmol/L 23 22 26   Calcium 8.9 - 10.3 mg/dL 9.6 9.4 9.9  Total Protein 6.5 - 8.1 g/dL - - -  Total Bilirubin 0.3 - 1.2 mg/dL - - -  Alkaline Phos 38 - 126 U/L - - -  AST 15 - 41 U/L - - -  ALT 0 - 44 U/L - - -    Pending Labs: none  Imaging: No results found.  Assessment/Plan:   Active Problems:   Hip fracture [facility])    has a past medical history of High  cholesterol, Hypertension, Hypothyroidism, and Stroke ([place]).  Patient Summary: DEMARUS LATTERELL a 86 y.o.with PMH of CVA, Hypothyroidism, HTN, Dementiaadmit for R hip fracture after fall.  He is on hospital day 4 and remains confused and having a difficult time participating with physical therapy.   Right femurcomminuted intertrochantericfracture s/p IM nail placement Fall Osteoporosis Patient continues to have pain in his hip. It is difficult to assess improvement. Will continue current pain management regimen. Patient would likely benefit from bisphosphonate therapy due to fragility fractures. His PTH and Ca are WNLs. He has never had a dexa scan. Patient' AKI has resolved and will initiate bisphosphonate at discharge.  - Will continue APAP 1g q 6hr, Oxy IR prn pain, and IV dilaudid for breakthrough - Continue PT/OT, recommend SNF, but PACE prefer outpatient rehab.    Normocytic anemia Hgb of 7.7 post op. Will continue to monitor and transfuse for Hgb < 7.    Breakthrough COVID+  Patient still not requiring oxygen remains afebrile.  Patient does have history of receiving the majority vaccine on 10/03/19, 11/09/19.  He does not appear to be symptomatic. We will continue to follow CRP and D-dimer.  Acute Kidney Injury 2/2 dehydration - Resolved   Diet: Heart Healthy IVF: None,None VTE: Enoxaparin Code: Full PT/OT recs: SNF for Subacute PT, none. TOC recs: [facility] want outpt Rehab.    Dispo: Anticipated discharge to Home in 1 days pending improvement.[name]    Marianna Payment, D.O. [facility], PGY-2 Date 04/16/2020 Time 11:33 AM Pager: [phone] Please contact the on call pager after 5 pm and on weekends at [phone].

## 2020-04-17 MED ORDER — APIXABAN 2.5 MG PO TABS: 2.5000 mg | ORAL_TABLET | Freq: Two times a day (BID) | ORAL | 0 refills | Status: DC

## 2020-04-17 MED ORDER — ACETAMINOPHEN 500 MG PO TABS: 1000.0000 mg | ORAL_TABLET | Freq: Four times a day (QID) | ORAL | 0 refills | Status: AC

## 2020-04-17 MED ORDER — METOPROLOL TARTRATE 25 MG PO TABS: 25.0000 mg | ORAL_TABLET | Freq: Two times a day (BID) | ORAL | 0 refills | Status: DC

## 2020-04-17 MED ORDER — OXYCODONE HCL 5 MG PO TABS
5.0000 mg | ORAL_TABLET | ORAL | 0 refills | Status: DC | PRN
Start: 2020-04-17 — End: 2022-05-22

## 2020-04-17 NOTE — Discharge Instructions (Signed)
Orthopedics Discharge:   Patient is status post right IM nail on 04/14/2020.   Weightbearing status:  weightbearing as tolerated.   Maintain dressings until follow up in 2 weeks status post surgery. Dressing changes as needed.   Pain control: Tylenol as needed   Follow up with Dr. Stann Mainland in 2 weeks at [facility]. [phone]

## 2020-04-17 NOTE — Discharge Summary (Signed)
Name: Patrick Rhodes MRN: [id number] DOB: 06/19/1930 86 y.o. PCP: System, Pcp Not In  Date of Admission: 04/11/2020  9:09 PM Date of Discharge: 04/17/2020 Attending Physician: Patrick Contes, MD  Discharge Diagnosis: 1. Comminuted right intertrochanteric fracture 2/2 fall 2. Post-operative anemia 3. Asymptomatic Breakthrough COVID infection 4. Acute Kidney Injury 2/2 dehydration  Discharge Medications: Allergies as of 04/17/2020   No Known Allergies     Medication List    TAKE these medications   acetaminophen 500 MG tablet Commonly known as: TYLENOL Take 2 tablets (1,000 mg total) by mouth every 6 (six) hours.   apixaban 2.5 MG Tabs tablet Commonly known as: Eliquis Take 1 tablet (2.5 mg total) by mouth 2 (two) times daily for 10 days.   atorvastatin 20 MG tablet Commonly known as: LIPITOR Take 1 tablet (20 mg total) by mouth daily at 6 PM. What changed: when to take this   clopidogrel 75 MG tablet Commonly known as: PLAVIX Take 75 mg by mouth daily.   hydroxypropyl methylcellulose / hypromellose 2.5 % ophthalmic solution Commonly known as: ISOPTO TEARS / GONIOVISC Place 1 drop into both eyes 3 (three) times daily.   levothyroxine 88 MCG tablet Commonly known as: SYNTHROID Take 88 mcg by mouth daily before breakfast.   losartan 100 MG tablet Commonly known as: COZAAR Take 100 mg by mouth daily.   metoprolol tartrate 25 MG tablet Commonly known as: LOPRESSOR Take 1 tablet (25 mg total) by mouth 2 (two) times daily.   oxyCODONE 5 MG immediate release tablet Commonly known as: Oxy IR/ROXICODONE Take 1 tablet (5 mg total) by mouth every 3 (three) hours as needed for severe pain.   PreserVision AREDS 2 Chew Chew 1 tablet by mouth See admin instructions. Cut 1 tablet into two halves and chew two times a day (to equal a sum total of 2 tablets/day)   Vitamin D-3 25 MCG (1000 UT) Caps Take 1,000 Units by mouth daily with breakfast.            Durable  Medical Equipment  (From admission, onward)         Start     Ordered   04/17/20 1033  For home use only DME Hospital bed  Once       Question Answer Comment  Length of Need 6 Months   Patient has (list medical condition): Vascular dementia, Hip fracture   The above medical condition requires: Patient requires the ability to reposition frequently   Bed type Semi-electric   Hoyer Lift Yes      04/17/20 1039   04/17/20 1031  DME Walker  Once       Question Answer Comment  Walker: With [place]   Patient needs a walker to treat with the following condition Hip fracture ([place])      04/17/20 1039   04/17/20 1025  DME standard manual wheelchair with seat cushion  Once       Comments: Patient suffers from vascular dementia and hip fracture which impairs their ability to perform daily activities like bathing, dressing, grooming, and toileting in the home.  A walker will not resolve issue with performing activities of daily living. A wheelchair will allow patient to safely perform daily activities. Patient can safely propel the wheelchair in the home or has a caregiver who can provide assistance. Length of need 6 months . Accessories: elevating leg rests (ELRs), wheel locks, extensions and anti-tippers.   04/17/20 1039  Discharge Care Instructions  (From admission, onward)         Start     Ordered   04/17/20 0000  Change dressing (specify)       Comments: Dressing change: As needed for two weeks until follow up visit with orthopedic clinic   04/17/20 1039   04/17/20 0000  Change dressing (specify)       Comments: Maintain dressings until follow up in 2 weeks status post surgery. Dressing changes as needed.   04/17/20 1348          Disposition and follow-up:   Mr.Patrick Rhodes was discharged from [facility] in Stable condition.  At the hospital follow up visit please address:  1. Comminuted right intertrochanteric fracture 2/2 fall - Assess  and up-titrate his pain regimen if needed (discharged w/ tylenol and oxycodone prn) - Ensure he f/u with ortho in 2 weeks - Consider starting bisphosphonate therapy as he has had a fragility fracture - Ensure he completes 14 days of post-op DVT prophylaxis (eliquis 2.5mg  BID for 10 additional days)  2. Post-operative anemia - At discharge, hgb of 7.7 - Check cbc - Assess for possible bleed or hematoma formation if anemia worsens  3. Asymptomatic Breakthrough COVID infection - Found to have COVID + despite being vaccinated but without symptoms - Ensure he receives his booster COVID vaccine shot 10 days after COVID + status (diagnosed 04/11/20)  4. Acute Kidney Injury 2/2 dehydration - Resolved at discharge (creatinine 0.91) - Check bmp  2.  Labs / imaging needed at time of follow-up: cbc, bmp  3.  Pending labs/ test needing follow-up: N/A  Follow-up Appointments:  Follow-up Information    Patrick Stairs, MD In 2 weeks.   Specialty: Orthopedic Surgery Why: For suture removal Contact information: [address] [place] [address] [phone]        Inc, [place]. Go on 04/21/2020.   Contact information: Patrick Rhodes [address] [place] by problem list:  1. Comminuted right intertrochanteric fracture 2/2 fall: Mr.Patrick Rhodes is a 86 yo M w/ PMh of vascular dementia, CVA, HTN, Hypothyroidism who presented to [facility] after an unwitnessed fall at home. His CT head was negative for any intracranial injury. His hip X-ray was positive for right intertrochanteric fracture. Orthopedic surgery was consulted who brought him to OR on 04/13/20 for intramedullary implant. He had no significant complications and was evaluated by PT/OT who recommended discharge to SNF. [facility] needs. Mr.Patrick Rhodes discharge home with [facility] with pain meds and eliquis for 2 week  course of DVT prophylaxis after hip surgery  2. Post-operative anemia: On admission noted to have hemoglobin of 9.1. He received 1 unit of pRBC in preparation for surgery. After operation, his hemoglobin dropped to 7.4. Hemoglobin was trended during hospitalization and was observed to be stable at discharge at 7.7.  3. Asymptomatic Breakthrough COVID infection: On admission, noted to have + COVID PCR. He was found to have no oxygen requirement and was afebrile during hospitalization. Viral load count was found to not be high enough for him to be eligible for monoclonal antibody treatment during hospitalization. Discharged w/ recommendation to get booster shot for COVID.  4. Acute Kidney Injury 2/2 dehydration: On admission, noted to have acute kidney injury with BUN of 29 and creatinine of 1.35. He  received IV fluids during his hospitalization and his renal function recovered swiftly. At discharge, BUN 23 and creatinine 0.91.  Discharge Vitals:   BP (!) 168/76 (BP Location: Right Arm)   Pulse 70   Temp 97.7 F (36.5 C) (Oral)   Resp 15   Wt 64.9 kg   SpO2 98%   Pertinent Labs, Studies, and Procedures:  CBC Latest Ref Rng & Units 04/16/2020 04/15/2020 04/13/2020  WBC 4.0 - 10.5 K/uL 7.9 7.0 9.0  Hemoglobin 13.0 - 17.0 g/dL 7.7(L) 7.4(L) 10.1(L)  Hematocrit 39 - 52 % 23.3(L) 23.4(L) 30.7(L)  Platelets 150 - 400 K/uL PLATELET CLUMPS NOTED ON SMEAR, UNABLE TO ESTIMATE 160 148(L)   BMP Latest Ref Rng & Units 04/16/2020 04/15/2020 04/13/2020  Glucose 70 - 99 mg/dL 87 108(H) 117(H)  BUN 8 - 23 mg/dL 23 26(H) 33(H)  Creatinine 0.61 - 1.24 mg/dL 0.91 0.91 1.27(H)  Sodium 135 - 145 mmol/L 142 141 141  Potassium 3.5 - 5.1 mmol/L 4.2 3.6 4.2  Chloride 98 - 111 mmol/L 110 110 107  CO2 22 - 32 mmol/L 23 22 26   Calcium 8.9 - 10.3 mg/dL 9.6 9.4 9.9   CT OF THE RIGHT HIP WITHOUT CONTRAST  FINDINGS: Bones/Joint/Cartilage  There is a a mildly comminuted intratrochanteric fracture of the right hip  identified. The lesser trochanter is avulsed and is displaced anteriorly, superiorly, and medially. The femoral head is still seated within the right acetabulum with mild superimposed right hip degenerative arthritis. The fracture plane extends through the greater trochanter with the femoral shaft in continuity with the anterior aspect of the greater trochanter. This demonstrates roughly 1 cm lateral displacement and 2-3 cm override. There is avulsion of the posterior aspect of the greater trochanter involving the insertion of the gluteal musculature which appears displaced medially. There is extensive, mature periosteal callus likely related to remote trauma or inflammation involving the a insertion of the gluteal musculature.  Ligaments  Suboptimally assessed by CT.  Muscles and Tendons  No significant findings  Soft tissues  Mild edema/hemorrhage in the region of fracture. Moderate prostate enlargement. Moderate atherosclerotic calcification within the aortoiliac arterial vasculature.  IMPRESSION: 1. Mildly comminuted and displaced 4 part intratrochanteric fracture of the right hip. Femoral head remains seated within the right acetabulum with mild superimposed degenerative hip arthritis. 2. Avulsion of the a lesser trochanter medially and superiorly. 3. Avulsion of the posterior aspect of the greater trochanter involving the insertion of the gluteal musculature which appears displaced medially. 4. Extensive, mature periosteal callus likely related to remote trauma or inflammation involving the a insertion of the gluteal musculature.  Aortic Atherosclerosis (ICD10-I70.0).  CT HEAD WITHOUT CONTRAST  FINDINGS: Brain: No acute intracranial hemorrhage. There is an old left cerebellar infarct. There is periventricular hypoattenuation compatible with chronic microvascular disease. Multiple small vessel chronic infarcts of the deep gray nuclei.  Vascular: No  abnormal hyperdensity of the major intracranial arteries or dural venous sinuses. No intracranial atherosclerosis.  Skull: The visualized skull base, calvarium and extracranial soft tissues are normal.  Sinuses/Orbits: No fluid levels or advanced mucosal thickening of the visualized paranasal sinuses. No mastoid or middle ear effusion. The orbits are normal.  IMPRESSION: 1. No acute intracranial abnormality. 2. Old left cerebellar infarct and findings of chronic microvascular ischemia.  DG HIP (WITH OR WITHOUT PELVIS) 2-3V RIGHT  FINDINGS: The osseous structures appear diffusely demineralized which may limit detection of small or nondisplaced fractures.  There is a comminuted obliquely oriented inter trochanteric right femur  fracture with slight foreshortening, external rotation and valgus angulation across the fracture line. Proximal femoral component including the articular surface of the femoral head, remains in normal articulation with the right acetabulum. Remaining bones of the pelvis are intact and congruent albeit with suboptimal assessment of the sacrum due to extensive overlying bowel gas and bowel contents. Degenerative changes noted throughout the lower lumbar spine, hips and pelvis. Large bony excrescence along the left iliac crest may be degenerative or enthesopathic in nature possibly related to the gluteal musculature. Atherosclerotic calcifications in the soft tissues.  IMPRESSION: 1. Comminuted, obliquely oriented intertrochanteric right femur fracture with slight foreshortening, external rotation and valgus angulation across the fracture line. Femoral head remains normally located. 2. The osseous structures appear diffusely demineralized. 3. No other discernible acute osseous injuries. 4. Large bony excrescence along the left iliac crest, indeterminate but possibly enthesopathic, related to the gluteal musculature.  Discharge  Instructions: Discharge Instructions    Call MD for:  persistant dizziness or light-headedness   Complete by: As directed    Call MD for:  persistant nausea and vomiting   Complete by: As directed    Call MD for:  redness, tenderness, or signs of infection (pain, swelling, redness, odor or green/yellow discharge around incision site)   Complete by: As directed    Call MD for:  temperature >100.4   Complete by: As directed    Change dressing (specify)   Complete by: As directed    Dressing change: As needed for two weeks until follow up visit with orthopedic clinic   Change dressing (specify)   Complete by: As directed    Maintain dressings until follow up in 2 weeks status post surgery. Dressing changes as needed.   Diet - low sodium heart healthy   Complete by: As directed    Discharge instructions   Complete by: As directed    Dear Ronita Hipps  You came to [place] with hip pain. We have determined this was caused by hip fracture. Here are our recommendations for you at discharge:  Please make sure to follow up with your othopedic clinic Please take tylenol 500mg  as needed for pain I have sent oxycodone 5mg  in case you need a stronger pain medicine Please take eliquis 2.5mg  twice daily for 10 additional days Please take metoprolol 25mg  twice daily for your heart rate Take your other home medications as prescribed  Thank you for choosing [place]   Increase activity slowly   Complete by: As directed      Signed: [name], MD 04/18/2020, 1:49 PM Pager: [phone] After 5pm on weekdays and 1pm on weekends: On Call Pager: [phone]

## 2020-04-17 NOTE — TOC Transition Note (Signed)
Transition of Care [facility]) - CM/SW Discharge Note   Patient Details  Name: Patrick Rhodes MRN: [id number] Date of Birth: 1929/10/22  [facility] CM/SW Contact:  Benard Halsted, LCSW Phone Number: 04/17/2020, 2:02 PM   Clinical Narrative:    Patient will DC to: Home w/[facility] Anticipated DC date: 8/27 Family notified: Son Transport by: [facility] will pick up   Per MD patient ready for DC to home. RN, patient, patient's family, and [facility] notified of DC.   CSW will sign off for now as social work intervention is no longer needed. Please consult [place] again if new needs arise.      Final next level of care: [place] Barriers to Discharge: Barriers Resolved   Patient Goals and CMS Choice Patient states their goals for this hospitalization and ongoing recovery are:: Rehab   Choice offered to / list presented to : Adult Children  Discharge Placement                Patient to be transferred to facility by: [facility] Name of family member notified: Son Patient and family notified of of transfer: 04/17/20  Discharge Plan and Services In-house Referral: Clinical Social Work                                   Social Determinants of Health (SDOH) Interventions     Readmission Risk Interventions No flowsheet data found.

## 2020-04-17 NOTE — Progress Notes (Signed)
   Subjective:  Patrick Rhodes is a 86 y.o. with PMH of CVA, Hypothyroidism, HTN admit for hip fracture on hospital day 5   Patrick Rhodes was examined and evaluated at bedside this am. He was noted to be disoriented but mentions 'feels good' while he was being examined. Denies any significant pain or distress  Objective:  Vital signs in last 24 hours: Vitals:   04/16/20 2140 04/17/20 0500 04/17/20 0530 04/17/20 0814  BP: (!) 189/95  (!) 180/94 (!) 156/70  Pulse: 85  85 72  Resp: 18  20 17   Temp: (!) 97.5 F (36.4 C)  (!) 97.4 F (36.3 C) 98.8 F (37.1 C)  TempSrc: Axillary  Oral Oral  SpO2: 95%  98%   Weight:  64.9 kg     Gen: Well-developed, chronically ill-appearing, NAD HEENT: NCAT head, hearing intact, MMM Pulm: CTAB, no rales, no wheezes  Skin: Dry, Warm, Right hip with fresh bandaging, no notable surrounding discharge or erythema Neuro: AAOx1  Assessment/Plan:  Active Problems:   Hip fracture (HCC)  Patrick Rhodes is a 86 y.o. with PMH of CVA, Hypothyroidism, HTN, Dementia admit for R hip fracture after fall  Right femur comminuted intertrochanteric fracture Osteoporosis Post-op day 5 from IM nail by ortho. Pain appears to be well controlled on oral meds. Not requiring IV dilaudid for breakthrough. Currently on tylenol and oxycodone prn (last dose 08/25). PT recommend SNF but [facility] will take over rehab needs in outpatient setting. - Discharge home today - C/w scheduled tylenol to 1g q6hr - Oxy IR PRN for pain  Normocytic anemia Pre-op hgb 10.1. Post-op cbc 7.4. Trended to assess for possible hematoma but remained stable at 7.7  - Transfuse if <7  Breakthrough COVID+  Current not requiring supplemental oxygen. Afebrile. Patient and family at home all vaccinated. Per discussion with pharmacy, patient not eligible for monoclonal antibody due to CT count of 37.2. Found to have been vaccinated with Moderna 10/03/19, 11/09/19. Does not appear to be symptomatic.  -  No further intervnetion required  DVT prophx: lovenox Diet: HH Bowel: Miralax Code: Full  Prior to Admission Living Arrangement: Home Anticipated Discharge Location: Hom e Barriers to Discharge: None Dispo: Anticipated discharge in approximately today(s).   Patrick Anis, MD 04/17/2020, 10:07 AM Pager: [phone] After 5pm on weekdays and 1pm on weekends: On Call Pager: [phone]

## 2020-11-17 ENCOUNTER — Telehealth: Payer: Self-pay

## 2020-11-17 NOTE — Telephone Encounter (Signed)
I connected by phone with Patrick Rhodes and/or patient's caregiver on 11/17/2020 at 2:58 PM to discuss the potential vaccination through our Homebound vaccination initiative.   Prevaccination Checklist for COVID-19 Vaccines  1.  Are you feeling sick today? no  2.  Have you ever received a dose of a COVID-19 vaccine?  yes      If yes, which one? Moderna   How many dose of Covid-19 vaccine have your received and dates ? 2, 10/03/2019, 10/30/2019   Check all that apply: I live in a long-term care setting. no  I have been diagnosed with a medical condition(s). Please list: CVA (pertinent to homebound status)  I am a first responder. no  I work in a long-term care facility, correctional facility, hospital, restaurant, retail setting, school, or other setting with high exposure to the public. no  4. Do you have a health condition or are you undergoing treatment that makes you moderately or severely immunocompromised? (This would include treatment for cancer or HIV, receipt of organ transplant, immunosuppressive therapy or high-dose corticosteroids, CAR-T-cell therapy, hematopoietic cell transplant [HCT], DiGeorge syndrome or Wiskott-Aldrich syndrome)  no  5. Have you received hematopoietic cell transplant (HCT) or CAR-T-cell therapies since receiving COVID-19 vaccine? no  6.  Have you ever had an allergic reaction: (This would include a severe reaction [ e.g., anaphylaxis] that required treatment with epinephrine or EpiPen or that caused you to go to the hospital.  It would also include an allergic reaction that occurred within 4 hours that caused hives, swelling, or respiratory distress, including wheezing.) A.  A previous dose of COVID-19 vaccine. no  B.  A vaccine or injectable therapy that contains multiple components, one of which is a COVID-19 vaccine component, but it is not known which component elicited the immediate reaction. no  C.  Are you allergic to polyethylene glycol? no  D. Are you  allergic to Polysorbate, which is found in some vaccines, film coated tablets and intravenous steroids?  no   7.  Have you ever had an allergic reaction to another vaccine (other than COVID-19 vaccine) or an injectable medication? (This would include a severe reaction [ e.g., anaphylaxis] that required treatment with epinephrine or EpiPen or that caused you to go to the hospital.  It would also include an allergic reaction that occurred within 4 hours that caused hives, swelling, or respiratory distress, including wheezing.)  no   8.  Have you ever had a severe allergic reaction (e.g., anaphylaxis) to something other than a component of the COVID-19 vaccine, or any vaccine or injectable medication?  This would include food, pet, venom, environmental, or oral medication allergies.  no   Check all that apply to you:  Am a male between ages 69 and 56 years old  no  Women 60 through 85 years of age can receive any FDA-authorized or -approved COVID-19 vaccine. However, they should be informed of the rare but increased risk of thrombosis with thrombocytopenia syndrome (TTS) after receipt of the [employer] Vaccine and the availability of other FDA-authorized and -approved COVID-19 vaccines. People who had TTS after a first dose of Janssen vaccine should not receive a subsequent dose of Janssen product    Am a male between ages 43 and 40 years old  no Males 5 through 85 years of age may receive the correct formulation of Pfizer-BioNTech COVID-19 vaccine. Males 18 and older can receive any FDA-authorized or -approved vaccine. However, people receiving an mRNA COVID-19 vaccine, especially males  36 through 85 years of age and their parents/legal representative (when relevant), should be informed of the risk of developing myocarditis (an inflammation of the heart muscle) or pericarditis (inflammation of the lining around the heart) after receipt of an mRNA vaccine. The risk of developing either myocarditis or  pericarditis after vaccination is low, and lower than the risk of myocarditis associated with SARS-CoV-2 infection in adolescents and adults. Vaccine recipients should be counseled about the need to seek care if symptoms of myocarditis or pericarditis develop after vaccination     Have a history of myocarditis or pericarditis  no Myocarditis or pericarditis after receipt of the first dose of an mRNA COVID-19 vaccine series but before administration of the second dose  Experts advise that people who develop myocarditis or pericarditis after a dose of an mRNA COVID-19 vaccine not receive a subsequent dose of any COVID-19 vaccine, until additional safety data are available.  Administration of a subsequent dose of COVID-19 vaccine before safety data are available can be considered in certain circumstances after the episode of myocarditis or pericarditis has completely resolved. Until additional data are available, some experts recommend a Alphonsa Overall COVID-19 vaccine be considered instead of an mRNA COVID-19 vaccine. Decisions about proceeding with a subsequent dose should include a conversation between the patient, their parent/legal representative (when relevant), and their clinical team, which may include a cardiologist.    Have been treated with monoclonal antibodies or convalescent serum to prevent or treat COVID-19  no Vaccination should be offered to people regardless of history of prior symptomatic or asymptomatic SARS-CoV-2 infection. There is no recommended minimal interval between infection and vaccination.  However, vaccination should be deferred if a patient received monoclonal antibodies or convalescent serum as treatment for COVID-19 or for post-exposure prophylaxis. This is a precautionary measure until additional information becomes available, to avoid interference of the antibody treatment with vaccine-induced immune responses.  Defer COVID-19 vaccination for 30 days when a passive antibody  product was used for post-exposure prophylaxis.  Defer COVID-19 vaccination for 90 days when a passive antibody product was used to treat COVID-19.     Diagnosed with Multisystem Inflammatory Syndrome (MIS-C or MIS-A) after a COVID-19 infection  no It is unknown if people with a history of MIS-C or MIS-A are at risk for a dysregulated immune response to COVID-19 vaccination.  People with a history of MIS-C or MIS-A may choose to be vaccinated. Considerations for vaccination may include:   Clinical recovery from MIS-C or MIS-A, including return to normal cardiac function   Personal risk of severe acute COVID-19 (e.g., age, underlying conditions)   High or substantial community transmission of SARS-CoV-2 and personal increased risk of reinfection.   Timing of any immunomodulatory therapies (general best practice guidelines for immunization can be consulted for more information [email or web address])   It has been 90 days or more since their diagnosis of MIS-C   Onset of MIS-C occurred before any COVID-19 vaccination   A conversation between the patient, their guardian(s), and their clinical team or a specialist may assist with COVID-19 vaccination decisions. Healthcare providers and health departments may also request a consultation from the [facility] at [email or web address].     Have a bleeding disorder  no Take a blood thinner  yes, Plavix and Eliquis As with all vaccines, any COVID-19 vaccine product may be given to these patients, if a physician familiar with the patient's bleeding risk determines that the vaccine can be administered intramuscularly with reasonable safety.  ACIP recommends the following technique for intramuscular vaccination in patients with bleeding disorders or taking blood thinners: a fine-gauge needle (23-gauge or smaller caliber) should be  used for the vaccination, followed by firm pressure on the site, without rubbing, for at least 2 minutes.  People who regularly take aspirin or anticoagulants as part of their routine medications do not need to stop these medications prior to receipt of any COVID-19 vaccine.    Have a history of heparin-induced thrombocytopenia (HIT)  no Although the etiology of TTS associated with the Alphonsa Overall COVID-19 vaccine is unclear, it appears to be similar to another rare immune-mediated syndrome, heparin-induced thrombocytopenia (HIT). People with a history of an episode of an immune-mediated syndrome characterized by thrombosis and thrombocytopenia, such as HIT, should be offered a currently FDA-approved or FDA-authorized mRNA COVID-19 vaccine if it has been ?90 days since their TTS resolved. After 90 days, patients may be vaccinated with any currently FDA-approved or FDA-authorized COVID-19 vaccine, including Janssen COVID-19 Vaccine. However, people who developed TTS after their initial Alphonsa Overall vaccine should not receive a Janssen booster dose.  Experts believe the following factors do not make people more susceptible to TTS after receipt of the [employer]. People with these conditions can be vaccinated with any FDA-authorized or - approved COVID-19 vaccine, including the [employer] COVID-19 Vaccine:   A prior history of venous thromboembolism   Risk factors for venous thromboembolism (e.g., inherited or acquired thrombophilia including Factor V Leiden; prothrombin gene 20210A mutation; antiphospholipid syndrome; protein C, protein S or antithrombin deficiency   A prior history of other types of thromboses not associated with thrombocytopenia   Pregnancy, post-partum status, or receipt of hormonal contraceptives (e.g., combined oral contraceptives, patch, ring)   Additional recipient education materials can be found at http://gutierrez-robinson.com/ vaccines/safety/JJUpdate.html.    Am  currently pregnant or breastfeeding  no Vaccination is recommended for all people aged 84 years and older, including people that are:   Pregnant   Breastfeeding   Trying to get pregnant now or who might become pregnant in the future   Pregnant, breastfeeding, and post-partum people 56 through 85 years of age should be aware of the rare risk of TTS after receipt of the Alphonsa Overall COVID-19 Vaccine and the availability of other FDA-authorized or -approved COVID-19 vaccines (i.e., mRNA vaccines).    Have received dermal fillers  no FDA-authorized or -approved COVID-19 vaccines can be administered to people who have received injectable dermal fillers who have no contraindications for vaccination.  Infrequently, these people might experience temporary swelling at or near the site of filler injection (usually the face or lips) following administration of a dose of an mRNA COVID-19 vaccine. These people should be advised to contact their healthcare provider if swelling develops at or near the site of dermal filler following vaccination.     Have a history of Guillain-Barr Syndrome (GBS)  no People with a history of GBS can receive any FDA-authorized or -approved COVID-19 vaccine. However, given the possible association between the [employer] and an increased risk of GBS, a patient with a history of GBS and their clinical team should discuss the availability of mRNA vaccines to offer protection against COVID-19. The highest risk has been observed in men aged 90-64 years with symptoms of GBS beginning within 42 days after Alphonsa Overall COVID-19 vaccination.  People who had GBS after receiving Janssen vaccine should be made aware of the option to receive an mRNA COVID-19 vaccine booster at least 2 months (8 weeks) after  the Janssen dose. However, Alphonsa Overall vaccine may be used as a booster, particularly if GBS occurred more than 42 days after vaccination or was related to a non-vaccine factor. Prior to  booster vaccination, a conversation between the patient and their clinical team may assist with decisions about use of a COVID-19 booster dose, including the timing of administration     Postvaccination Observation Times for People without Contraindications to Covid 19 Vaccination.  30 minutes:  People with a history of: A contraindication to another type of COVID-19 vaccine product (i.e., mRNA or viral vector COVID-19 vaccines)   Immediate (within 4 hours of exposure) non-severe allergic reaction to a COVID-19 vaccine or injectable therapies   Anaphylaxis due to any cause   Immediate allergic reaction of any severity to a non-COVID-19 vaccine   15 minutes: All other people  This patient is a 87 y.o. male that meets the FDA criteria to receive homebound vaccination. Patient or parent/caregiver understands they have the option to accept or refuse homebound vaccination.  Patient passed the pre-screening checklist and would like to proceed with homebound vaccination.  Based on questionnaire above, I recommend the patient be observed for 15 minutes.  There are an estimated #0 other household members/caregivers who are also interested in receiving the vaccine.    The patient has been confirmed homebound and eligible for homebound vaccination with the considerations outlined above. I will send the patient's information to our scheduling team who will reach out to schedule the patient and potential caregiver/family members for homebound vaccination.    Dan Humphreys 11/17/2020 2:58 PM

## 2022-05-22 ENCOUNTER — Inpatient Hospital Stay (HOSPITAL_COMMUNITY)
Admission: EM | Admit: 2022-05-22 | Discharge: 2022-05-26 | DRG: 378 | Disposition: A | Discharge status: Home Health | Payer: Medicare (Managed Care) | Attending: Internal Medicine | Admitting: Internal Medicine

## 2022-05-22 ENCOUNTER — Emergency Department (HOSPITAL_COMMUNITY): Payer: Medicare (Managed Care)

## 2022-05-22 ENCOUNTER — Encounter (HOSPITAL_COMMUNITY): Payer: Self-pay

## 2022-05-22 ENCOUNTER — Other Ambulatory Visit: Payer: Self-pay

## 2022-05-22 DIAGNOSIS — E039 Hypothyroidism, unspecified: Secondary | ICD-10-CM | POA: Diagnosis present

## 2022-05-22 DIAGNOSIS — C799 Secondary malignant neoplasm of unspecified site: Secondary | ICD-10-CM

## 2022-05-22 DIAGNOSIS — Z79899 Other long term (current) drug therapy: Secondary | ICD-10-CM

## 2022-05-22 DIAGNOSIS — D500 Iron deficiency anemia secondary to blood loss (chronic): Secondary | ICD-10-CM | POA: Diagnosis not present

## 2022-05-22 DIAGNOSIS — Z7902 Long term (current) use of antithrombotics/antiplatelets: Secondary | ICD-10-CM | POA: Diagnosis not present

## 2022-05-22 DIAGNOSIS — F0390 Unspecified dementia without behavioral disturbance: Secondary | ICD-10-CM | POA: Diagnosis present

## 2022-05-22 DIAGNOSIS — D649 Anemia, unspecified: Secondary | ICD-10-CM

## 2022-05-22 DIAGNOSIS — E860 Dehydration: Secondary | ICD-10-CM | POA: Diagnosis present

## 2022-05-22 DIAGNOSIS — Z515 Encounter for palliative care: Secondary | ICD-10-CM

## 2022-05-22 DIAGNOSIS — Z7901 Long term (current) use of anticoagulants: Secondary | ICD-10-CM | POA: Diagnosis not present

## 2022-05-22 DIAGNOSIS — E7800 Pure hypercholesterolemia, unspecified: Secondary | ICD-10-CM | POA: Diagnosis present

## 2022-05-22 DIAGNOSIS — C7802 Secondary malignant neoplasm of left lung: Secondary | ICD-10-CM | POA: Diagnosis present

## 2022-05-22 DIAGNOSIS — D72829 Elevated white blood cell count, unspecified: Secondary | ICD-10-CM | POA: Diagnosis present

## 2022-05-22 DIAGNOSIS — K922 Gastrointestinal hemorrhage, unspecified: Principal | ICD-10-CM | POA: Diagnosis present

## 2022-05-22 DIAGNOSIS — Z79890 Hormone replacement therapy (postmenopausal): Secondary | ICD-10-CM | POA: Diagnosis not present

## 2022-05-22 DIAGNOSIS — C787 Secondary malignant neoplasm of liver and intrahepatic bile duct: Secondary | ICD-10-CM | POA: Diagnosis present

## 2022-05-22 DIAGNOSIS — E876 Hypokalemia: Secondary | ICD-10-CM | POA: Diagnosis present

## 2022-05-22 DIAGNOSIS — Z66 Do not resuscitate: Secondary | ICD-10-CM | POA: Diagnosis not present

## 2022-05-22 DIAGNOSIS — C166 Malignant neoplasm of greater curvature of stomach, unspecified: Secondary | ICD-10-CM | POA: Diagnosis present

## 2022-05-22 DIAGNOSIS — R590 Localized enlarged lymph nodes: Secondary | ICD-10-CM | POA: Diagnosis present

## 2022-05-22 DIAGNOSIS — I10 Essential (primary) hypertension: Secondary | ICD-10-CM | POA: Diagnosis present

## 2022-05-22 DIAGNOSIS — C7801 Secondary malignant neoplasm of right lung: Secondary | ICD-10-CM | POA: Diagnosis present

## 2022-05-22 DIAGNOSIS — Z993 Dependence on wheelchair: Secondary | ICD-10-CM

## 2022-05-22 DIAGNOSIS — R627 Adult failure to thrive: Secondary | ICD-10-CM | POA: Diagnosis present

## 2022-05-22 DIAGNOSIS — C269 Malignant neoplasm of ill-defined sites within the digestive system: Secondary | ICD-10-CM | POA: Diagnosis not present

## 2022-05-22 DIAGNOSIS — Z7982 Long term (current) use of aspirin: Secondary | ICD-10-CM

## 2022-05-22 DIAGNOSIS — K769 Liver disease, unspecified: Secondary | ICD-10-CM | POA: Diagnosis present

## 2022-05-22 DIAGNOSIS — R911 Solitary pulmonary nodule: Secondary | ICD-10-CM | POA: Diagnosis present

## 2022-05-22 DIAGNOSIS — N179 Acute kidney failure, unspecified: Secondary | ICD-10-CM

## 2022-05-22 DIAGNOSIS — D509 Iron deficiency anemia, unspecified: Secondary | ICD-10-CM | POA: Diagnosis present

## 2022-05-22 DIAGNOSIS — Z7189 Other specified counseling: Secondary | ICD-10-CM | POA: Diagnosis not present

## 2022-05-22 DIAGNOSIS — Z8673 Personal history of transient ischemic attack (TIA), and cerebral infarction without residual deficits: Secondary | ICD-10-CM

## 2022-05-22 HISTORY — DX: Unspecified dementia, unspecified severity, without behavioral disturbance, psychotic disturbance, mood disturbance, and anxiety: F03.90

## 2022-05-22 LAB — URINALYSIS, ROUTINE W REFLEX MICROSCOPIC
Bacteria, UA: NONE SEEN
Bilirubin Urine: NEGATIVE
Glucose, UA: NEGATIVE mg/dL
Ketones, ur: NEGATIVE mg/dL
Nitrite: NEGATIVE
Protein, ur: NEGATIVE mg/dL
Specific Gravity, Urine: 1.014 (ref 1.005–1.030)
pH: 5 (ref 5.0–8.0)

## 2022-05-22 LAB — CBC WITH DIFFERENTIAL/PLATELET
Abs Immature Granulocytes: 0.05 10*3/uL (ref 0.00–0.07)
Basophils Absolute: 0 10*3/uL (ref 0.0–0.1)
Basophils Relative: 0 %
Eosinophils Absolute: 0 10*3/uL (ref 0.0–0.5)
Eosinophils Relative: 0 %
HCT: 15.5 % — ABNORMAL LOW (ref 39.0–52.0)
Hemoglobin: 4.4 g/dL — CL (ref 13.0–17.0)
Immature Granulocytes: 1 %
Lymphocytes Relative: 11 %
Lymphs Abs: 0.9 10*3/uL (ref 0.7–4.0)
MCH: 22.1 pg — ABNORMAL LOW (ref 26.0–34.0)
MCHC: 28.4 g/dL — ABNORMAL LOW (ref 30.0–36.0)
MCV: 77.9 fL — ABNORMAL LOW (ref 80.0–100.0)
Monocytes Absolute: 0.7 10*3/uL (ref 0.1–1.0)
Monocytes Relative: 8 %
Neutro Abs: 7 10*3/uL (ref 1.7–7.7)
Neutrophils Relative %: 80 %
Platelets: 512 10*3/uL — ABNORMAL HIGH (ref 150–400)
RBC: 1.99 MIL/uL — ABNORMAL LOW (ref 4.22–5.81)
RDW: 17.9 % — ABNORMAL HIGH (ref 11.5–15.5)
WBC: 8.7 10*3/uL (ref 4.0–10.5)
nRBC: 0.7 % — ABNORMAL HIGH (ref 0.0–0.2)

## 2022-05-22 LAB — CBG MONITORING, ED
Glucose-Capillary: 50 mg/dL — ABNORMAL LOW (ref 70–99)
Glucose-Capillary: 93 mg/dL (ref 70–99)

## 2022-05-22 LAB — COMPREHENSIVE METABOLIC PANEL
ALT: 40 U/L (ref 0–44)
AST: 90 U/L — ABNORMAL HIGH (ref 15–41)
Albumin: 2.3 g/dL — ABNORMAL LOW (ref 3.5–5.0)
Alkaline Phosphatase: 106 U/L (ref 38–126)
Anion gap: 11 (ref 5–15)
BUN: 41 mg/dL — ABNORMAL HIGH (ref 8–23)
CO2: 25 mmol/L (ref 22–32)
Calcium: 9.5 mg/dL (ref 8.9–10.3)
Chloride: 112 mmol/L — ABNORMAL HIGH (ref 98–111)
Creatinine, Ser: 1.6 mg/dL — ABNORMAL HIGH (ref 0.61–1.24)
GFR, Estimated: 41 mL/min — ABNORMAL LOW (ref >60)
Glucose, Bld: 101 mg/dL — ABNORMAL HIGH (ref 70–99)
Potassium: 4 mmol/L (ref 3.5–5.1)
Sodium: 148 mmol/L — ABNORMAL HIGH (ref 135–145)
Total Bilirubin: 0.6 mg/dL (ref 0.3–1.2)
Total Protein: 6.6 g/dL (ref 6.5–8.1)

## 2022-05-22 LAB — LACTIC ACID, PLASMA: Lactic Acid, Venous: 1.5 mmol/L (ref 0.5–1.9)

## 2022-05-22 LAB — TSH: TSH: 2.242 u[IU]/mL (ref 0.350–4.500)

## 2022-05-22 LAB — PREPARE RBC (CROSSMATCH)

## 2022-05-22 MED ORDER — ACETAMINOPHEN 650 MG RE SUPP: 650.0000 mg | Freq: Four times a day (QID) | RECTAL | Status: DC | PRN

## 2022-05-22 MED ORDER — DEXTROSE 50 % IV SOLN
1.0000 | Freq: Once | INTRAVENOUS | Status: AC
  Administered 2022-05-24: 50 mL via INTRAVENOUS
  Filled 2022-05-22: qty 50

## 2022-05-22 MED ORDER — PANTOPRAZOLE SODIUM 40 MG IV SOLR
40.0000 mg | Freq: Once | INTRAVENOUS | Status: AC
  Administered 2022-05-22: 40 mg via INTRAVENOUS
  Filled 2022-05-22: qty 10

## 2022-05-22 MED ORDER — ONDANSETRON HCL 4 MG/2ML IJ SOLN: 4.0000 mg | Freq: Four times a day (QID) | INTRAMUSCULAR | Status: DC | PRN

## 2022-05-22 MED ORDER — SODIUM CHLORIDE 0.9 % IV BOLUS
1000.0000 mL | Freq: Once | INTRAVENOUS | Status: AC
  Administered 2022-05-22: 1000 mL via INTRAVENOUS

## 2022-05-22 MED ORDER — DEXTROSE 5 % IV SOLN: INTRAVENOUS | Status: AC

## 2022-05-22 MED ORDER — SODIUM CHLORIDE 0.9% IV SOLUTION: Freq: Once | INTRAVENOUS | Status: AC

## 2022-05-22 MED ORDER — ONDANSETRON HCL 4 MG PO TABS: 4.0000 mg | ORAL_TABLET | Freq: Four times a day (QID) | ORAL | Status: DC | PRN

## 2022-05-22 MED ORDER — ACETAMINOPHEN 325 MG PO TABS: 650.0000 mg | ORAL_TABLET | Freq: Four times a day (QID) | ORAL | Status: DC | PRN

## 2022-05-22 MED ORDER — LACTATED RINGERS IV BOLUS
500.0000 mL | Freq: Once | INTRAVENOUS | Status: AC
  Administered 2022-05-22: 500 mL via INTRAVENOUS

## 2022-05-22 NOTE — ED Notes (Signed)
Lr bolus paused, pt's PIV in L AC infiltrated. IV removed. Rn unable to obtain other IV access, will give LR bolus after blood transfusion is completed.

## 2022-05-22 NOTE — Consult Note (Signed)
[facility] Consult Note   History Patrick Rhodes MRN # [id number]  Date of Admission: 05/22/2022 Date of Consultation: 05/22/2022 Referring physician: Dr. Deno Etienne, DO Primary Care Provider: Pcp, No Primary Gastroenterologist: None   Reason for Consultation/Chief Complaint: Microcytic anemia  Subjective  HPI:  We were asked by the emergency department physician to evaluate this patient fo severe microcytic anemia.  Due to this patient's advanced dementia and nonverbal status, no history or review of systems can be obtained from him.  His family was not present at the time of my evaluation.  According to the ED provider note, he was sent by family via EMS for change in mental status, though details unclear.  Patient has no head trauma on CT scan, he has significant white matter changes consistent with his advanced age.  No fracture or stroke seen.  No overt GI bleeding has been witnessed since arrival.  ROS:  As noted above, no history or review of systems can be obtained from this patient.  Past Medical History Past Medical History:  Diagnosis Date   Dementia ([place])    High cholesterol    Hypertension    Hypothyroidism    Stroke ([place])    No current details of his history of CVA are available  Past Surgical History Past Surgical History:  Procedure Laterality Date   INTRAMEDULLARY (IM) NAIL INTERTROCHANTERIC Right 04/13/2020   Procedure: INTRAMEDULLARY (IM) NAIL INTERTROCHANTRIC RIGHT FEMORAL NAIL;  Surgeon: Nicholes Stairs, MD;  Location: [facility];  Service: Orthopedics;  Laterality: Right;   Hip fracture repair 2 years ago  Family History History reviewed. No pertinent family history. Unable to obtain Social History Social History   Socioeconomic History   Marital status: Widowed    Spouse name: Not on file   Number of children: Not on file   Years of education: Not on file   Highest education level: Not on file  Occupational History   Not on  file  Tobacco Use   Smoking status: Never   Smokeless tobacco: Never  Substance and Sexual Activity   Alcohol use: No   Drug use: No   Sexual activity: Not on file  Other Topics Concern   Not on file  Social History Narrative   Not on file   Social Determinants of Health   Financial Resource Strain: Not on file  Food Insecurity: Not on file  Transportation Needs: Not on file  Physical Activity: Not on file  Stress: Not on file  Social Connections: Not on file    Allergies No Known Allergies  Outpatient Meds Home medications from the [facility] and/or nursing med reconciliation reviewed.  Eliquis Lipitor Vitamin D Plavix Synthroid Cozaar           _____________________________________________________________________ Objective   Exam:  Current vital signs  Patient Vitals for the past 8 hrs:  BP Temp Temp src Pulse Resp SpO2 Height Weight  05/22/22 1100 112/70 -- -- 88 11 100 % -- --  05/22/22 1043 -- -- -- -- -- 97 % -- --  05/22/22 1043 -- 98.1 F (36.7 C) Rectal 90 15 -- -- --  05/22/22 1027 108/83 98.1 F (36.7 C) Rectal (!) 102 16 -- -- --  05/22/22 1022 -- -- -- -- -- -- '5\' 10"'$  (1.778 m) 59 kg   No intake or output data in the 24 hours ending 05/22/22 1153  Physical Exam:   General: this is a very thin, feeble elderly African-American male patient who is minimally  verbal with grunts and other noises.  He does not consistently track visually.  Most of the time he is curled up in a ball and somewhat combative, making the exam difficult. Eyes: sclera anicteric, no redness ENT: oral mucosa moist without lesions, no cervical or supraclavicular lymphadenopathy, he appears to be edentulous CV: Irregular without appreciable murmur, no peripheral edema Resp: clear to auscultation bilaterally, normal RR and effort noted GI: soft, no obvious tenderness,, not distended or tympanitic.  Difficult to assess for mass or hepatosplenomegaly since he is pushing me away and  tensing up his abdomen while rolling away from me. Skin; warm and dry, no rash or jaundice noted Neuro: No facial droop.  He appears to be moving all extremities, fairly contracted throughout. Rectal exam (also difficult due to his mental status and behavior) decreased resting sphincter tone, no palpable internal lesion, soft brown-green stool.  Labs:     Latest Ref Rng & Units 05/22/2022   10:46 AM 04/16/2020    4:07 AM 04/15/2020    2:52 PM  CBC  WBC 4.0 - 10.5 K/uL 8.7  7.9  7.0   Hemoglobin 13.0 - 17.0 g/dL 4.4  7.7  7.4   Hematocrit 39.0 - 52.0 % 15.5  23.3  23.4   Platelets 150 - 400 K/uL 512  PLATELET CLUMPS NOTED ON SMEAR, UNABLE TO ESTIMATE  160        Latest Ref Rng & Units 04/16/2020    4:07 AM 04/15/2020    2:52 PM 04/13/2020    6:52 AM  CMP  Glucose 70 - 99 mg/dL 87  108  117   BUN 8 - 23 mg/dL 23  26  33   Creatinine 0.61 - 1.24 mg/dL 0.91  0.91  1.27   Sodium 135 - 145 mmol/L 142  141  141   Potassium 3.5 - 5.1 mmol/L 4.2  3.6  4.2   Chloride 98 - 111 mmol/L 110  110  107   CO2 22 - 32 mmol/L '23  22  26   '$ Calcium 8.9 - 10.3 mg/dL 9.6  9.4  9.9     No results for input(s): "INR" in the last 168 hours. _________________________________________________________ Radiologic studies:  CT head report on file ______________________________________________________ Other studies:   _______________________________________________________ Assessment & Plan  Impression: Severe microcytic anemia, possibly iron deficient. Advanced nonverbal dementia Reported history of cerebrovascular disease on antiplatelet agent and anticoagulant.  Poor and quite limited overall functional status  Plan:  This patient is being admitted to the medicine service with general failure to thrive and severe anemia.  Recommend the following:  Iron levels, D92 and folic acid -heme test stool Transfuse 3 units PRBCs If creatinine sufficiently improves with rehydration, consider CT abdomen  and pelvis to see if there are any obvious neoplasms.  GI and primary medical team will need to have further discussion with this patient's family about the goals of care and willingness to undergo endoscopic procedures.  An upper endoscopy is probably feasible for him (though decidedly increased risk given his age and overall poor condition), but it is very doubtful that he can undergo bowel preparation for colonoscopy.  Dr. Lorenso Courier will assume the consult service tomorrow and we will follow.  Thank you for the courtesy of this consult.  Please contact me with any questions or concerns.  Nelida Meuse III Office: [phone]

## 2022-05-22 NOTE — H&P (Signed)
Date: 05/22/2022               Patient Name:  Patrick Rhodes MRN: [id number]  DOB: July 24, 1930 Age / Sex: 88 y.o., male   PCP: Pcp, No         Medical Service: Internal Medicine Teaching Service         Attending Physician: Dr. Velna Ochs    First Contact: Dr. Marlene Lard Patrick Rhodes Pager: [phone]  Second Contact: Dr. Rick Duff Pager: [phone]       After Hours (After 5p/  First Contact Pager: [phone]  weekends / holidays): Second Contact Pager: [phone]   Chief Complaint: Failure to thrive  History of Present Illness:  [name] is a 88 year old male with past medical history of hypertension, hypothyroidism, and two CVA's (last one being in 2013) who presented to the emergency department with convusion that has lasted about two weeks. Patient's history was taken from son who was sitting bedside, as patient is disorientated.  Son states that patient is usually very independent, he is able to move around by himself in his wheelchair, feed himself, and was very responsive to questions.  He states over the last 2 weeks, he has noticed that his father has not been as responsive to questions, has had a decreased appetite, and has had dark formed stools.  Son has tried to feed father, however he has been denying any p.o. intake besides a couple sips of Ensure or orange juice.  Previous to the the last couple weeks, patient was doing well.  Patient has been wheelchair-bound since 2021, where he broke his hip.  However, he has still been able to complete most ADLs/IADLs by himself until symptoms presented 2 weeks ago.  He currently lives at home with his son and his girlfriend, they also have a caretaker that comes to see him as well.  Son denies that patient has been complaining of any chest pain, shortness of breath, recent fevers.   Meds:  Levothyroxine 100 mcg Aspirin 81 mg Plavix 75 mg  Vitamin D supplementation   Current Meds  Medication Sig   acetaminophen (TYLENOL) 500  MG tablet Take 2 tablets (1,000 mg total) by mouth every 6 (six) hours. (Patient taking differently: Take 1,000 mg by mouth every 6 (six) hours as needed for mild pain.)   aspirin EC 81 MG tablet Take 81 mg by mouth daily. Swallow whole.   Cholecalciferol (VITAMIN D-3) 25 MCG (1000 UT) CAPS Take 1,000 Units by mouth daily with breakfast.   clopidogrel (PLAVIX) 75 MG tablet Take 75 mg by mouth daily.   hydroxypropyl methylcellulose / hypromellose (ISOPTO TEARS / GONIOVISC) 2.5 % ophthalmic solution Place 1 drop into both eyes 3 (three) times daily as needed for dry eyes.   levothyroxine (SYNTHROID) 100 MCG tablet Take 100 mcg by mouth daily before breakfast.   losartan (COZAAR) 100 MG tablet Take 100 mg by mouth daily.   Multiple Vitamins-Minerals (CENTRUM ADULTS) TABS Take 1 tablet by mouth daily.     Allergies: Allergies as of 05/22/2022   (No Known Allergies)   Past Medical History:  Diagnosis Date   Dementia (HCC)    High cholesterol    Hypertension    Hypothyroidism    Stroke [facility])     Family History: No relevant family history  Social History:  Patient currently lives with son and son's girlfriend.  Son states that patient has never smoked cigarettes, and has not used alcohol in over  20 years.  He is a [facility] patient, however does not remember the name of the primary care physician at [facility]  Review of Systems: A complete ROS was negative except as per HPI.   Physical Exam: Blood pressure 122/79, pulse 95, temperature 98.1 F (36.7 C), temperature source Oral, resp. rate (!) 21, height '5\' 10"'$  (1.778 m), weight 59 kg, SpO2 97 %. Constitutional: Ill-appearing male, unable to communicate Cardiovascular: Normal rate and rhythm, no murmur heard, no rub or gallops heard Pulmonary: No respiratory distress, lungs clear to auscultation Neurological: AAO x0, commands cannot be followed  EKG: personally reviewed my interpretation is sinus rhythm  CXR: personally reviewed my  interpretation is no acute findings   CT HEAD WO CONTRAST  Result Date: 05/22/2022 CLINICAL DATA:  Delirium.  Altered mental status. EXAM: CT HEAD WITHOUT CONTRAST TECHNIQUE: Contiguous axial images were obtained from the base of the skull through the vertex without intravenous contrast. RADIATION DOSE REDUCTION: This exam was performed according to the departmental dose-optimization program which includes automated exposure control, adjustment of the mA and/or kV according to patient size and/or use of iterative reconstruction technique. COMPARISON:  04/12/2020. FINDINGS: Brain: No evidence of acute infarction, hemorrhage, hydrocephalus, extra-axial collection or mass effect. No parenchymal masses. Isoattenuating extra-axial mass overlies the superior left frontal lobe, 2.4 x 1.1 x 2 cm, consistent with a meningioma, mildly enlarged compared to the CT and brain MRI dated 09/2018 where it measured 1.9 x 1.1 x 1.8 cm. Cavum septum pellucidum and vergae, incidental developmental anomaly, stable. Multiple old basal ganglia and deep white matter lacunar infarcts. Chronic microvascular ischemic change. Old left cerebellar infarct. Vascular: No hyperdense vessel or unexpected calcification. Skull: Normal. Negative for fracture or focal lesion. Sinuses/Orbits: Globes and orbits are unremarkable. Visualized sinuses are clear. Other: None. IMPRESSION: 1. No acute intracranial abnormalities. No change the most recent prior head CT. Electronically Signed   By: Lajean Manes M.D.   On: 05/22/2022 11:40   DG Chest Port 1 View  Result Date: 05/22/2022 CLINICAL DATA:  Altered mental status EXAM: PORTABLE CHEST 1 VIEW COMPARISON:  04/11/2020 FINDINGS: Chronic cardiomegaly and aortic tortuosity/elongation. Artifact from EKG leads. There is no edema, consolidation, effusion, or pneumothorax. No acute osseous finding. IMPRESSION: Stable exam.  No acute finding. Electronically Signed   By: Jorje Guild M.D.   On: 05/22/2022  11:16     Assessment & Plan by Problem: Severe Microcytic Anemia    #Severe Microcytic Anemia, suspected GI bleed Patient presented with hemoglobin level of 4.4.  His son says that for the past month or so, he has had dark formed bowel movements.  In the emergency department, patient is receiving 3 units of blood.  Patient has been on Plavix because of multiple previous CVAs, last one being in 2013. Baseline appears to be around 7.7  Plan: - Appreciate GI recommendations - We will order iron levels, A25, folic acid heme test stool - If creatinine sufficiently improved with rehydration, can consider CT abdomen and pelvis - Trend CBC - Hold Aspirin and Plavix  #AMS Secondary to Dehydration  Investigation for causes of AMS have been underway.  Initial glucose level upon entering was 50, however upon recheck was 93.  No signs of acute infection such as pneumonia, as patient is afebrile, with no leukocytosis.  Chest x-ray is also negative for acute findings.  Head CT showed no acute abnormalities.  On CMP, sodium level was elevated at 148, chloride level was also elevated at 112, and  given patient's 2-week history of decreasing p.o. intake including fluids, dehydration may be playing a factor here.  Patient's creatinine level is also increased to 1.6.  Still was elevated at 90, however lactic acid was normal at 1.5   Plan: - Await UA results - Continue fluids as necessary - Daily BMPs   #Hypertension Patient is a history of hypertension, and was previously taking losartan for this.  Plan: - Blood pressure has been stable since arrival to emergency department, will hold losartan for now  #AKI in the setting of dehydration Pt's baseline creatinine seems to be .8-.9, admission BMP revealed a creatinine value of 1.6, likely in the setting of dehydration.  Plan:  - Continue rehydration with fluids - Daily BMPs  #Hypothyroidism Pt has a history of hypothyroidism, will resume  levothyroxine 154mg daily. TSH lab was done which revealed a value of 2.242.   Dispo: Admit patient to Inpatient with expected length of stay greater than 2 midnights.  Signed:Drucie Opitz MD 05/22/2022, 1:51 PM  Pager:: [phone] 5pm on weekdays and 1pm on weekends: On Call pager: [phone]

## 2022-05-22 NOTE — ED Triage Notes (Signed)
Patient came into ED via [facility] from home. Patients son has reported that over the past week patient has not been eating or walking around as he normally does at home. He aslo reports that the patient has dementia and is baseline as confused.

## 2022-05-22 NOTE — ED Notes (Signed)
Patient transported to CT 

## 2022-05-22 NOTE — Hospital Course (Signed)
Admitted: 05/22/22 Allergies: NKDA Pertinent PMH: Hypertension, Hypothyroidism, CVA x72     88 year old male with severe anemia. Son noticed significant decrease in activities for the last two months. Found to have Hb level of 4.4. Son says pt has had dark formed stools for the last two months. GI is on board and he is currently receiving three units of blood in the emergency department.   *Symptomatic Anemia - Currently receiving blood infusions, will get CTAP when creatinine settles to evaluate for where bleed is coming from   #AMS Secondary to Dehydration- Dehydration likely cause, as CT showed no acute abnormalities, and infectious causes have been ruled out by no leukocytosis, negative chest x-ray, and negative UA. Na level was elevated at 148, and Cl at 112.   *AKI - Pt's baseline is .8-.9, current creatinine is 1.6.   Consults: GI Meds: Awaiting SLP   10/3 - I feel fine, I feel real good.   10/4 - tired. Says he remembers [place]. Not sure where son is.

## 2022-05-22 NOTE — ED Provider Notes (Signed)
[facility] EMERGENCY DEPARTMENT Provider Note   CSN: [id number] Arrival date & time: 05/22/22  1016     History  Chief Complaint  Patient presents with   Failure To Thrive    Patrick Rhodes is a 88 y.o. male.  88 yo M with a ams.  Reported not eating and drinking normally.  Worsening confusion.  Patient without obvious complaint.  Demented.  Level V caveat.          Home Medications Prior to Admission medications   Medication Sig Start Date End Date Taking? Authorizing Provider  acetaminophen (TYLENOL) 500 MG tablet Take 2 tablets (1,000 mg total) by mouth every 6 (six) hours. 04/17/20   [name], MD  apixaban (ELIQUIS) 2.5 MG TABS tablet Take 1 tablet (2.5 mg total) by mouth 2 (two) times daily for 10 days. 04/17/20 04/27/20  [name], MD  atorvastatin (LIPITOR) 20 MG tablet Take 1 tablet (20 mg total) by mouth daily at 6 PM. Patient taking differently: Take 20 mg by mouth daily.  10/19/18   [name], MD  Cholecalciferol (VITAMIN D-3) 25 MCG (1000 UT) CAPS Take 1,000 Units by mouth daily with breakfast.    [provider]  clopidogrel (PLAVIX) 75 MG tablet Take 75 mg by mouth daily.    [provider]  hydroxypropyl methylcellulose / hypromellose (ISOPTO TEARS / GONIOVISC) 2.5 % ophthalmic solution Place 1 drop into both eyes 3 (three) times daily.    [provider]  levothyroxine (SYNTHROID) 88 MCG tablet Take 88 mcg by mouth daily before breakfast.    [provider]  losartan (COZAAR) 100 MG tablet Take 100 mg by mouth daily.    [provider]  metoprolol tartrate (LOPRESSOR) 25 MG tablet Take 1 tablet (25 mg total) by mouth 2 (two) times daily. 04/17/20   [name], MD  Multiple Vitamins-Minerals (PRESERVISION AREDS 2) CHEW Chew 1 tablet by mouth See admin instructions. Cut 1 tablet into two halves and chew two times a day (to equal a sum total of 2 tablets/day)    [provider]   oxyCODONE (OXY IR/ROXICODONE) 5 MG immediate release tablet Take 1 tablet (5 mg total) by mouth every 3 (three) hours as needed for severe pain. 04/17/20   [name], MD      Allergies    Patient has no known allergies.    Review of Systems   Review of Systems  Physical Exam Updated Vital Signs BP 133/74   Pulse 99   Temp 98.1 F (36.7 C) (Rectal)   Resp 12   Ht '5\' 10"'$  (1.778 m)   Wt 59 kg   SpO2 95%   BMI 18.65 kg/m  Physical Exam Vitals and nursing note reviewed.  Constitutional:      Appearance: He is well-developed.  HENT:     Head: Normocephalic and atraumatic.  Eyes:     Pupils: Pupils are equal, round, and reactive to light.  Neck:     Vascular: No JVD.  Cardiovascular:     Rate and Rhythm: Normal rate and regular rhythm.     Heart sounds: No murmur heard.    No friction rub. No gallop.  Pulmonary:     Effort: No respiratory distress.     Breath sounds: No wheezing.  Abdominal:     General: There is no distension.     Tenderness: There is no abdominal tenderness. There is no guarding or rebound.  Musculoskeletal:  General: Normal range of motion.     Cervical back: Normal range of motion and neck supple.  Skin:    Coloration: Skin is not pale.     Findings: No rash.  Neurological:     Mental Status: He is alert.     Comments: Able to answer yes and no questions, localizes to pain  Psychiatric:        Behavior: Behavior normal.     ED Results / Procedures / Treatments   Labs (all labs ordered are listed, but only abnormal results are displayed) Labs Reviewed  COMPREHENSIVE METABOLIC PANEL - Abnormal; Notable for the following components:      Result Value   Sodium 148 (*)    Chloride 112 (*)    Glucose, Bld 101 (*)    BUN 41 (*)    Creatinine, Ser 1.60 (*)    Albumin 2.3 (*)    AST 90 (*)    GFR, Estimated 41 (*)    All other components within normal limits  CBC WITH DIFFERENTIAL/PLATELET - Abnormal; Notable for the following  components:   RBC 1.99 (*)    Hemoglobin 4.4 (*)    HCT 15.5 (*)    MCV 77.9 (*)    MCH 22.1 (*)    MCHC 28.4 (*)    RDW 17.9 (*)    Platelets 512 (*)    nRBC 0.7 (*)    All other components within normal limits  CBG MONITORING, ED - Abnormal; Notable for the following components:   Glucose-Capillary 50 (*)    All other components within normal limits  LACTIC ACID, PLASMA  AMMONIA  URINALYSIS, ROUTINE W REFLEX MICROSCOPIC  TSH  CBG MONITORING, ED  PREPARE RBC (CROSSMATCH)  TYPE AND SCREEN    EKG EKG Interpretation  Date/Time:  Sunday May 22 2022 10:22:03 EDT Ventricular Rate:  99 PR Interval:  159 QRS Duration: 102 QT Interval:  356 QTC Calculation: 457 R Axis:   69 Text Interpretation: sinus rhythm Multiple premature complexes, vent & supraven Borderline T abnormalities, lateral leads No significant change since last tracing Confirmed by Deno Etienne [phone]) on 05/22/2022 10:34:34 AM  Radiology CT HEAD WO CONTRAST  Result Date: 05/22/2022 CLINICAL DATA:  Delirium.  Altered mental status. EXAM: CT HEAD WITHOUT CONTRAST TECHNIQUE: Contiguous axial images were obtained from the base of the skull through the vertex without intravenous contrast. RADIATION DOSE REDUCTION: This exam was performed according to the departmental dose-optimization program which includes automated exposure control, adjustment of the mA and/or kV according to patient size and/or use of iterative reconstruction technique. COMPARISON:  04/12/2020. FINDINGS: Brain: No evidence of acute infarction, hemorrhage, hydrocephalus, extra-axial collection or mass effect. No parenchymal masses. Isoattenuating extra-axial mass overlies the superior left frontal lobe, 2.4 x 1.1 x 2 cm, consistent with a meningioma, mildly enlarged compared to the CT and brain MRI dated 09/2018 where it measured 1.9 x 1.1 x 1.8 cm. Cavum septum pellucidum and vergae, incidental developmental anomaly, stable. Multiple old basal ganglia and  deep white matter lacunar infarcts. Chronic microvascular ischemic change. Old left cerebellar infarct. Vascular: No hyperdense vessel or unexpected calcification. Skull: Normal. Negative for fracture or focal lesion. Sinuses/Orbits: Globes and orbits are unremarkable. Visualized sinuses are clear. Other: None. IMPRESSION: 1. No acute intracranial abnormalities. No change the most recent prior head CT. Electronically Signed   By: Lajean Manes M.D.   On: 05/22/2022 11:40   DG Chest Port 1 View  Result Date: 05/22/2022 CLINICAL DATA:  Altered mental status EXAM: PORTABLE CHEST 1 VIEW COMPARISON:  04/11/2020 FINDINGS: Chronic cardiomegaly and aortic tortuosity/elongation. Artifact from EKG leads. There is no edema, consolidation, effusion, or pneumothorax. No acute osseous finding. IMPRESSION: Stable exam.  No acute finding. Electronically Signed   By: Jorje Guild M.D.   On: 05/22/2022 11:16    Procedures Procedures    Medications Ordered in ED Medications  dextrose 50 % solution 50 mL (50 mLs Intravenous Not Given 05/22/22 1052)  0.9 %  sodium chloride infusion (Manually program via Guardrails IV Fluids) (has no administration in time range)  sodium chloride 0.9 % bolus 1,000 mL (1,000 mLs Intravenous New Bag/Given 05/22/22 1044)  pantoprazole (PROTONIX) injection 40 mg (40 mg Intravenous Given 05/22/22 1151)    ED Course/ Medical Decision Making/ A&P                           Medical Decision Making Amount and/or Complexity of Data Reviewed Labs: ordered. Radiology: ordered.  Risk Prescription drug management.   88 yo M with increased confusion and decreased mobility at home.  Going on for about a week.  And patient is unable to provide any history entirety of history was provided by EMS.  We will give a bolus of IV fluids ultimately status work-up CT of the head chest x-ray UA.  Patient's initial CBG was 50, this was thought to be a poor sample on repeat it was 90.  Patient found  to have a hemoglobin of 4.4.  I discussed this with family who endorsed dark stools for at least the past month maybe 2 months.  He is on Eliquis.  We will order blood transfusion.  Patient's metabolic panel consistent with dehydration sodium and chloride are both elevated.  Patient's creatinine also has increased significantly.  Will discuss with medicine for admission.  CRITICAL CARE Performed by: Cecilio Asper   Total critical care time: 35 minutes  Critical care time was exclusive of separately billable procedures and treating other patients.  Critical care was necessary to treat or prevent imminent or life-threatening deterioration.  Critical care was time spent personally by me on the following activities: development of treatment plan with patient and/or surrogate as well as nursing, discussions with consultants, evaluation of patient's response to treatment, examination of patient, obtaining history from patient or surrogate, ordering and performing treatments and interventions, ordering and review of laboratory studies, ordering and review of radiographic studies, pulse oximetry and re-evaluation of patient's condition.  The patients results and plan were reviewed and discussed.   Any x-rays performed were independently reviewed by myself.   Differential diagnosis were considered with the presenting HPI.  Medications  dextrose 50 % solution 50 mL (50 mLs Intravenous Not Given 05/22/22 1052)  0.9 %  sodium chloride infusion (Manually program via Guardrails IV Fluids) (has no administration in time range)  sodium chloride 0.9 % bolus 1,000 mL (1,000 mLs Intravenous New Bag/Given 05/22/22 1044)  pantoprazole (PROTONIX) injection 40 mg (40 mg Intravenous Given 05/22/22 1151)    Vitals:   05/22/22 1043 05/22/22 1100 05/22/22 1145 05/22/22 1200  BP:  112/70 (!) 165/72 133/74  Pulse:  88 99   Resp:  '11 15 12  '$ Temp:      TempSrc:      SpO2: 97% 100% 95%   Weight:       Height:        Final diagnoses:  Dehydration  Symptomatic anemia  Admission/ observation were discussed with the admitting physician, patient and/or family and they are comfortable with the plan.         Final Clinical Impression(s) / ED Diagnoses Final diagnoses:  Dehydration  Symptomatic anemia    Rx / DC Orders ED Discharge Orders     None         Deno Etienne, DO 05/22/22 1212

## 2022-05-23 ENCOUNTER — Inpatient Hospital Stay (HOSPITAL_COMMUNITY): Payer: Medicare (Managed Care)

## 2022-05-23 DIAGNOSIS — K769 Liver disease, unspecified: Secondary | ICD-10-CM

## 2022-05-23 DIAGNOSIS — K922 Gastrointestinal hemorrhage, unspecified: Secondary | ICD-10-CM | POA: Diagnosis not present

## 2022-05-23 DIAGNOSIS — N179 Acute kidney failure, unspecified: Secondary | ICD-10-CM | POA: Diagnosis not present

## 2022-05-23 LAB — CBC
HCT: 29.8 % — ABNORMAL LOW (ref 39.0–52.0)
Hemoglobin: 9.4 g/dL — ABNORMAL LOW (ref 13.0–17.0)
MCH: 24.1 pg — ABNORMAL LOW (ref 26.0–34.0)
MCHC: 31.5 g/dL (ref 30.0–36.0)
MCV: 76.4 fL — ABNORMAL LOW (ref 80.0–100.0)
Platelets: 309 10*3/uL (ref 150–400)
RBC: 3.9 MIL/uL — ABNORMAL LOW (ref 4.22–5.81)
RDW: 19.8 % — ABNORMAL HIGH (ref 11.5–15.5)
WBC: 9.3 10*3/uL (ref 4.0–10.5)
nRBC: 2.8 % — ABNORMAL HIGH (ref 0.0–0.2)

## 2022-05-23 LAB — HEPATIC FUNCTION PANEL
ALT: 81 U/L — ABNORMAL HIGH (ref 0–44)
AST: 261 U/L — ABNORMAL HIGH (ref 15–41)
Albumin: 2.1 g/dL — ABNORMAL LOW (ref 3.5–5.0)
Alkaline Phosphatase: 323 U/L — ABNORMAL HIGH (ref 38–126)
Bilirubin, Direct: 3.8 mg/dL — ABNORMAL HIGH (ref 0.0–0.2)
Indirect Bilirubin: 2.3 mg/dL — ABNORMAL HIGH (ref 0.3–0.9)
Total Bilirubin: 6.1 mg/dL — ABNORMAL HIGH (ref 0.3–1.2)
Total Protein: 6.3 g/dL — ABNORMAL LOW (ref 6.5–8.1)

## 2022-05-23 LAB — FERRITIN: Ferritin: 59 ng/mL (ref 24–336)

## 2022-05-23 LAB — BASIC METABOLIC PANEL
Anion gap: 8 (ref 5–15)
BUN: 28 mg/dL — ABNORMAL HIGH (ref 8–23)
CO2: 24 mmol/L (ref 22–32)
Calcium: 8.7 mg/dL — ABNORMAL LOW (ref 8.9–10.3)
Chloride: 112 mmol/L — ABNORMAL HIGH (ref 98–111)
Creatinine, Ser: 1.28 mg/dL — ABNORMAL HIGH (ref 0.61–1.24)
GFR, Estimated: 53 mL/min — ABNORMAL LOW (ref >60)
Glucose, Bld: 103 mg/dL — ABNORMAL HIGH (ref 70–99)
Potassium: 3.1 mmol/L — ABNORMAL LOW (ref 3.5–5.1)
Sodium: 144 mmol/L (ref 135–145)

## 2022-05-23 LAB — IRON AND TIBC
Iron: 36 ug/dL — ABNORMAL LOW (ref 45–182)
Saturation Ratios: 14 % — ABNORMAL LOW (ref 17.9–39.5)
TIBC: 259 ug/dL (ref 250–450)
UIBC: 223 ug/dL

## 2022-05-23 LAB — FOLATE: Folate: 21.9 ng/mL (ref >5.9)

## 2022-05-23 LAB — VITAMIN B12: Vitamin B-12: 6033 pg/mL — ABNORMAL HIGH (ref 180–914)

## 2022-05-23 MED ORDER — DEXTROSE 5 % IV SOLN: INTRAVENOUS | Status: AC

## 2022-05-23 MED ORDER — SODIUM CHLORIDE 0.9 % IV SOLN
250.0000 mg | Freq: Once | INTRAVENOUS | Status: AC
  Administered 2022-05-23: 250 mg via INTRAVENOUS
  Filled 2022-05-23: qty 20

## 2022-05-23 MED ORDER — POTASSIUM CHLORIDE 10 MEQ/100ML IV SOLN
INTRAVENOUS | Status: AC
  Administered 2022-05-23: 10 meq via INTRAVENOUS
  Filled 2022-05-23: qty 200

## 2022-05-23 MED ORDER — POTASSIUM CHLORIDE 20 MEQ PO PACK
40.0000 meq | PACK | Freq: Two times a day (BID) | ORAL | Status: DC
  Administered 2022-05-23 – 2022-05-26 (×6): 40 meq via ORAL
  Filled 2022-05-23 (×7): qty 2

## 2022-05-23 MED ORDER — IOHEXOL 350 MG/ML SOLN
75.0000 mL | Freq: Once | INTRAVENOUS | Status: AC | PRN
  Administered 2022-05-23: 75 mL via INTRAVENOUS

## 2022-05-23 MED ORDER — POTASSIUM CHLORIDE 10 MEQ/100ML IV SOLN
10.0000 meq | INTRAVENOUS | Status: AC
  Administered 2022-05-23 (×3): 10 meq via INTRAVENOUS
  Filled 2022-05-23 (×3): qty 100

## 2022-05-23 MED ORDER — LACTATED RINGERS IV BOLUS
500.0000 mL | Freq: Once | INTRAVENOUS | Status: AC
  Administered 2022-05-23: 500 mL via INTRAVENOUS

## 2022-05-23 NOTE — ED Notes (Signed)
Ultrasound is at bedside

## 2022-05-23 NOTE — Progress Notes (Addendum)
Subjective:   Summary: Patrick Rhodes is a 88 y.o. year old male currently admitted on the IMTS HD#1 for severe microcytic anemia.  Overnight Events: NOE   Pt was seen this morning bedside. He was able to communicate, and seemed to understand questions that were being asked.   Objective:  Vital signs in last 24 hours: Vitals:   05/23/22 0300 05/23/22 0600 05/23/22 0639 05/23/22 1132  BP: (!) 113/55 (!) 98/54  112/66  Pulse: (!) 112 96  92  Resp: '20 15  18  '$ Temp:   99 F (37.2 C) 98.7 F (37.1 C)  TempSrc:   Axillary Axillary  SpO2: 100% 100%  100%  Weight:      Height:       Supplemental O2: Room Air SpO2: 100 %   Physical Exam:  Constitutional: Ill-appearing male, unable to speak coherently, no acute distress Cardiovascular: RRR, no murmurs, rubs or gallops Pulmonary/Chest: normal work of breathing on room air, lungs clear to auscultation bilaterally Abdominal: soft, non-tender, non-distended, small mass in epigastric region Skin: warm and dry Extremities: upper/lower extremity pulses 2+, no lower extremity edema present   Filed Weights   05/22/22 1022  Weight: 59 kg     Intake/Output Summary (Last 24 hours) at 05/23/2022 1157 Last data filed at 05/23/2022 0448 Gross per 24 hour  Intake 3573.6 ml  Output --  Net 3573.6 ml   Net IO Since Admission: 3,573.6 mL [05/23/22 1157]  Pertinent Labs:    Latest Ref Rng & Units 05/23/2022    4:59 AM 05/22/2022   10:46 AM 04/16/2020    4:07 AM  CBC  WBC 4.0 - 10.5 K/uL 9.3  8.7  7.9   Hemoglobin 13.0 - 17.0 g/dL 9.4  4.4  7.7   Hematocrit 39.0 - 52.0 % 29.8  15.5  23.3   Platelets 150 - 400 K/uL 309  512  PLATELET CLUMPS NOTED ON SMEAR, UNABLE TO ESTIMATE        Latest Ref Rng & Units 05/23/2022    4:59 AM 05/22/2022   10:46 AM 04/16/2020    4:07 AM  CMP  Glucose 70 - 99 mg/dL 103  101  87   BUN 8 - 23 mg/dL 28  41  23   Creatinine 0.61 - 1.24 mg/dL 1.28  1.60  0.91   Sodium 135 - 145  mmol/L 144  148  142   Potassium 3.5 - 5.1 mmol/L 3.1  4.0  4.2   Chloride 98 - 111 mmol/L 112  112  110   CO2 22 - 32 mmol/L '24  25  23   '$ Calcium 8.9 - 10.3 mg/dL 8.7  9.5  9.6   Total Protein 6.5 - 8.1 g/dL  6.6    Total Bilirubin 0.3 - 1.2 mg/dL  0.6    Alkaline Phos 38 - 126 U/L  106    AST 15 - 41 U/L  90    ALT 0 - 44 U/L  40     Imaging: US RENAL  Result Date: 05/23/2022 CLINICAL DATA:  AKI. EXAM: RENAL / URINARY TRACT ULTRASOUND COMPLETE COMPARISON:  None Available. FINDINGS: Right Kidney: Renal measurements: 9.1 x 4.9 x 3.4 cm = volume: 77 mL. Diffuse cortical thinning with increased echogenicity. No mass or hydronephrosis visualized. Left Kidney: Renal measurements: 9.5 x 5.4 x 3.5 cm = volume: 150 mL. Mild diffuse cortical thinning and  increased echogenicity. No hydronephrosis visualized. Two exophytic cortical cysts, both measuring 1.2 cm, are noted at the middle third of the kidney. Bladder: Appears normal for degree of bladder distention. Other: Multiple hyperechoic lesions are noted throughout the liver, the largest measuring 8.1 cm in the posterior right liver. Two adjacent hyperechoic masses in the left liver measure 3.6 cm and 2.8 cm. IMPRESSION: 1. Atrophic and echogenic kidneys, right-greater-than-left, consistent with chronic medical renal disease. 2. Benign left renal cysts.  No follow-up imaging indicated. 3. Multiple hyperechoic lesions are noted throughout the liver, measuring up to 8.1 cm. When the patient is clinically stable and able to follow directions and hold their breath (preferably as an outpatient) further evaluation with dedicated abdominal MRI should be considered. Electronically Signed   By: Ileana Roup M.D.   On: 05/23/2022 09:48     Assessment/Plan:   Principal Problem:   GIB (gastrointestinal bleeding)   Patient Summary: [name] is a 88 y.o. with a pertinent PMH of hypertension, hypothyroidism, 2 CVAs, who presented with decreased function  ability and admitted for severe microcytic anemia.    #Severe microcytic anemia, suspected GI malignancy #Liver lesions Patient initially presented with a hemoglobin level of 4.4.  After 3 units of blood, hemoglobin was increased to 9.4.  Patient was able to say that he feels better.  Patient's son was bedside and also states that father is looking much better.  Patient will undergo CT abdomen and pelvis today in order to probably visualize the GI tract.  Right upper quadrant ultrasound revealed atrophic and echogenic kidneys, consistent with chronic medical renal disease.  Ultrasound also revealed multiple hyperechoic echoic lesions that are noted throughout the liver, measuring up to 8.1 cm.  Plan: - Await CT abdomen and pelvis results - Liver lesions are new findings, unsure how long they have been there.  May have metastasis from suspected GI malignancy.  Currently unclear, will continue working this up.  #AKI, prerenal  In the setting of poor po intake. Creatinine has improved from 1.6, down to 1.28 after bolus administration.  Renal ultrasound revealed atrophic and echogenic kidneys, consistent with chronic medical renal disease.  Plan: - Continue fluid administration -Daily BMPs  #Hypokalemia Patient potassium level this a.m. was 3.1.  Currently giving potassium supplementation, will reevaluate in the a.m.  #Hypertension Patient has a history of hypertension, was previously taking losartan Plan: - Blood pressure has been soft, we will continue holding antihypertensive medications.  #AMS in the setting of dehydration and severe microcytic anemia Resolved   Code: Full   Dispo: Anticipated discharge in more than 2 midnights.  Drucie Opitz, MD PGY-1 Internal Medicine Resident Pager Number [phone] Please contact the on call pager after 5 pm and on weekends at [phone].

## 2022-05-23 NOTE — ED Notes (Signed)
ED TO INPATIENT HANDOFF REPORT  S Name/Age/Gender Patrick Rhodes 88 y.o. male Room/Bed: 041C/041C  Code Status   Code Status: Full Code  Home/SNF/Other Home Patient oriented to: self Is this baseline? No   Triage Complete: Triage complete  Chief Complaint GIB (gastrointestinal bleeding) [K92.2]  Triage Note Patient came into ED via [facility] from home. Patients son has reported that over the past week patient has not been eating or walking around as he normally does at home. He aslo reports that the patient has dementia and is baseline as confused.    Allergies No Known Allergies  Level of Care/Admitting Diagnosis ED Disposition     ED Disposition  Admit   Condition  --   Comment  Hospital Area: [facility] [[id number]]  Level of Care: Med-Surg [16]  May admit patient to [facility] or [facility] if equivalent level of care is available:: No  Covid Evaluation: Asymptomatic - no recent exposure (last 10 days) testing not required  Diagnosis: GIB (gastrointestinal bleeding) [132440]  Admitting Physician: Velna Ochs [[id number]]  Attending Physician: Velna Ochs [[id number]]  Certification:: I certify this patient will need inpatient services for at least 2 midnights  Estimated Length of Stay: 4          B Medical/Surgery History Past Medical History:  Diagnosis Date   Dementia ([place])    High cholesterol    Hypertension    Hypothyroidism    Stroke [facility])    Past Surgical History:  Procedure Laterality Date   INTRAMEDULLARY (IM) NAIL INTERTROCHANTERIC Right 04/13/2020   Procedure: INTRAMEDULLARY (IM) NAIL INTERTROCHANTRIC RIGHT FEMORAL NAIL;  Surgeon: Nicholes Stairs, MD;  Location: [facility];  Service: Orthopedics;  Laterality: Right;     A IV Location/Drains/Wounds Patient Lines/Drains/Airways Status     Active Line/Drains/Airways     Name Placement date Placement time Site Days   Peripheral IV 05/22/22 20 G Posterior;Right Hand  05/22/22  1250  Hand  1   Peripheral IV 05/22/22 20 G 1.88" Right;Anterior Forearm 05/22/22  1834  Forearm  1   Incision (Closed) 04/13/20 Hip 04/13/20  1212  -- 770            Intake/Output Last 24 hours  Intake/Output Summary (Last 24 hours) at 05/23/2022 1234 Last data filed at 05/23/2022 0448 Gross per 24 hour  Intake 3573.6 ml  Output --  Net 3573.6 ml    Labs/Imaging Results for orders placed or performed during the hospital encounter of 05/22/22 (from the past 48 hour(s))  Urinalysis, Routine w reflex microscopic Urine, Clean Catch     Status: Abnormal   Collection Time: 05/22/22 10:27 AM  Result Value Ref Range   Color, Urine YELLOW YELLOW   APPearance CLEAR CLEAR   Specific Gravity, Urine 1.014 1.005 - 1.030   pH 5.0 5.0 - 8.0   Glucose, UA NEGATIVE NEGATIVE mg/dL   Hgb urine dipstick MODERATE (A) NEGATIVE   Bilirubin Urine NEGATIVE NEGATIVE   Ketones, ur NEGATIVE NEGATIVE mg/dL   Protein, ur NEGATIVE NEGATIVE mg/dL   Nitrite NEGATIVE NEGATIVE   Leukocytes,Ua TRACE (A) NEGATIVE   RBC / HPF 11-20 0 - 5 RBC/hpf   WBC, UA 11-20 0 - 5 WBC/hpf   Bacteria, UA NONE SEEN NONE SEEN   Squamous Epithelial / LPF 0-5 0 - 5   Mucus PRESENT     Comment: Performed at [facility], [place] [address], [place], [place] [address]  CBG monitoring, ED  Status: Abnormal   Collection Time: 05/22/22 10:34 AM  Result Value Ref Range   Glucose-Capillary 50 (L) 70 - 99 mg/dL    Comment: Glucose reference range applies only to samples taken after fasting for at least 8 hours.  CBG monitoring, ED     Status: None   Collection Time: 05/22/22 10:40 AM  Result Value Ref Range   Glucose-Capillary 93 70 - 99 mg/dL    Comment: Glucose reference range applies only to samples taken after fasting for at least 8 hours.  Comprehensive metabolic panel     Status: Abnormal   Collection Time: 05/22/22 10:46 AM  Result Value Ref Range   Sodium 148 (H) 135 - 145 mmol/L   Potassium 4.0 3.5 -  5.1 mmol/L   Chloride 112 (H) 98 - 111 mmol/L   CO2 25 22 - 32 mmol/L   Glucose, Bld 101 (H) 70 - 99 mg/dL    Comment: Glucose reference range applies only to samples taken after fasting for at least 8 hours.   BUN 41 (H) 8 - 23 mg/dL   Creatinine, Ser 1.60 (H) 0.61 - 1.24 mg/dL   Calcium 9.5 8.9 - 10.3 mg/dL   Total Protein 6.6 6.5 - 8.1 g/dL   Albumin 2.3 (L) 3.5 - 5.0 g/dL   AST 90 (H) 15 - 41 U/L   ALT 40 0 - 44 U/L   Alkaline Phosphatase 106 38 - 126 U/L   Total Bilirubin 0.6 0.3 - 1.2 mg/dL   GFR, Estimated 41 (L) >60 mL/min    Comment: (NOTE) Calculated using the CKD-EPI Creatinine Equation (2021)    Anion gap 11 5 - 15    Comment: Performed at [facility] [address], [place], [place] [address]  Lactic acid, plasma     Status: None   Collection Time: 05/22/22 10:46 AM  Result Value Ref Range   Lactic Acid, Venous 1.5 0.5 - 1.9 mmol/L    Comment: Performed at [facility] [address], [place], [place] [address]  CBC with Differential/Platelet     Status: Abnormal   Collection Time: 05/22/22 10:46 AM  Result Value Ref Range   WBC 8.7 4.0 - 10.5 K/uL   RBC 1.99 (L) 4.22 - 5.81 MIL/uL   Hemoglobin 4.4 (LL) 13.0 - 17.0 g/dL    Comment: REPEATED TO VERIFY Reticulocyte Hemoglobin testing may be clinically indicated, consider ordering this additional test [id number] THIS CRITICAL RESULT HAS VERIFIED AND BEEN CALLED TO KRYSTAL SWORD RN BY MELISSA BOOSTEDT ON 10 01 2023 AT 1115, AND HAS BEEN READ BACK.     HCT 15.5 (L) 39.0 - 52.0 %   MCV 77.9 (L) 80.0 - 100.0 fL   MCH 22.1 (L) 26.0 - 34.0 pg   MCHC 28.4 (L) 30.0 - 36.0 g/dL   RDW 17.9 (H) 11.5 - 15.5 %   Platelets 512 (H) 150 - 400 K/uL   nRBC 0.7 (H) 0.0 - 0.2 %   Neutrophils Relative % 80 %   Neutro Abs 7.0 1.7 - 7.7 K/uL   Lymphocytes Relative 11 %   Lymphs Abs 0.9 0.7 - 4.0 K/uL   Monocytes Relative 8 %   Monocytes Absolute 0.7 0.1 - 1.0 K/uL   Eosinophils Relative 0 %   Eosinophils Absolute  0.0 0.0 - 0.5 K/uL   Basophils Relative 0 %   Basophils Absolute 0.0 0.0 - 0.1 K/uL   Immature Granulocytes 1 %   Abs Immature Granulocytes 0.05  0.00 - 0.07 K/uL    Comment: Performed at [facility], [place] [address] [place], [place] [address]  TSH     Status: None   Collection Time: 05/22/22 10:46 AM  Result Value Ref Range   TSH 2.242 0.350 - 4.500 uIU/mL    Comment: Performed by a 3rd Generation assay with a functional sensitivity of <=0.01 uIU/mL. Performed at [facility], [place] [address] [place], [place] [address]   Prepare RBC (crossmatch)     Status: None   Collection Time: 05/22/22 11:20 AM  Result Value Ref Range   Order Confirmation      ORDER PROCESSED BY BLOOD BANK Performed at [facility], [place] [address] [place], [place] [address]   Type and screen     Status: None (Preliminary result)   Collection Time: 05/22/22 11:38 AM  Result Value Ref Range   ABO/RH(D) O POS    Antibody Screen NEG    Sample Expiration 05/25/2022,2359    Unit Number [id number]    Blood Component Type RED CELLS,LR    Unit division 00    Status of Unit ISSUED,FINAL    Transfusion Status OK TO TRANSFUSE    Crossmatch Result Compatible    Unit Number [id number]    Blood Component Type RED CELLS,LR    Unit division 00    Status of Unit ISSUED,FINAL    Transfusion Status OK TO TRANSFUSE    Crossmatch Result      Compatible Performed at [facility], [place] [address], [place], [place] [address]    Unit Number [id number]    Blood Component Type RED CELLS,LR    Unit division 00    Status of Unit ISSUED,FINAL    Transfusion Status OK TO TRANSFUSE    Crossmatch Result Compatible    Unit Number [id number]    Blood Component Type RED CELLS,LR    Unit division 00    Status of Unit ALLOCATED    Transfusion Status OK TO TRANSFUSE    Crossmatch Result Compatible   Prepare RBC (crossmatch)     Status: None   Collection Time: 05/22/22  8:31 PM  Result Value Ref  Range   Order Confirmation      ORDER PROCESSED BY BLOOD BANK Performed at [facility], [place] [address] [place], [place] [address]   Basic metabolic panel     Status: Abnormal   Collection Time: 05/23/22  4:59 AM  Result Value Ref Range   Sodium 144 135 - 145 mmol/L   Potassium 3.1 (L) 3.5 - 5.1 mmol/L   Chloride 112 (H) 98 - 111 mmol/L   CO2 24 22 - 32 mmol/L   Glucose, Bld 103 (H) 70 - 99 mg/dL    Comment: Glucose reference range applies only to samples taken after fasting for at least 8 hours.   BUN 28 (H) 8 - 23 mg/dL   Creatinine, Ser 1.28 (H) 0.61 - 1.24 mg/dL   Calcium 8.7 (L) 8.9 - 10.3 mg/dL   GFR, Estimated 53 (L) >60 mL/min    Comment: (NOTE) Calculated using the CKD-EPI Creatinine Equation (2021)    Anion gap 8 5 - 15    Comment: Performed at [facility] [address], [place] [address]  CBC     Status: Abnormal   Collection Time: 05/23/22  4:59 AM  Result Value Ref Range   WBC 9.3 4.0 - 10.5 K/uL   RBC 3.90 (L)  4.22 - 5.81 MIL/uL   Hemoglobin 9.4 (L) 13.0 - 17.0 g/dL    Comment: REPEATED TO VERIFY POST TRANSFUSION SPECIMEN    HCT 29.8 (L) 39.0 - 52.0 %   MCV 76.4 (L) 80.0 - 100.0 fL   MCH 24.1 (L) 26.0 - 34.0 pg   MCHC 31.5 30.0 - 36.0 g/dL   RDW 19.8 (H) 11.5 - 15.5 %   Platelets 309 150 - 400 K/uL   nRBC 2.8 (H) 0.0 - 0.2 %    Comment: Performed at [facility] [address], [place], [place] [address]  Vitamin B12     Status: Abnormal   Collection Time: 05/23/22  4:59 AM  Result Value Ref Range   Vitamin B-12 6,033 (H) 180 - 914 pg/mL    Comment: (NOTE) This assay is not validated for testing neonatal or myeloproliferative syndrome specimens for Vitamin B12 levels. Performed at [facility], [place] [address], [place], [place] [address]   Ferritin     Status: None   Collection Time: 05/23/22  4:59 AM  Result Value Ref Range   Ferritin 59 24 - 336 ng/mL    Comment: ICTERUS AT THIS LEVEL MAY AFFECT  RESULT Performed at [facility], [place] [address], [place], [place] [address]   Iron and TIBC     Status: Abnormal   Collection Time: 05/23/22  4:59 AM  Result Value Ref Range   Iron 36 (L) 45 - 182 ug/dL   TIBC 259 250 - 450 ug/dL   Saturation Ratios 14 (L) 17.9 - 39.5 %   UIBC 223 ug/dL    Comment: Performed at [facility], [place] [address] [place], [place] [address]  Folate     Status: None   Collection Time: 05/23/22  4:59 AM  Result Value Ref Range   Folate 21.9 >5.9 ng/mL    Comment: Performed at [facility] [address], [place], [place] [address]   US RENAL  Result Date: 05/23/2022 CLINICAL DATA:  AKI. EXAM: RENAL / URINARY TRACT ULTRASOUND COMPLETE COMPARISON:  None Available. FINDINGS: Right Kidney: Renal measurements: 9.1 x 4.9 x 3.4 cm = volume: 77 mL. Diffuse cortical thinning with increased echogenicity. No mass or hydronephrosis visualized. Left Kidney: Renal measurements: 9.5 x 5.4 x 3.5 cm = volume: 150 mL. Mild diffuse cortical thinning and increased echogenicity. No hydronephrosis visualized. Two exophytic cortical cysts, both measuring 1.2 cm, are noted at the middle third of the kidney. Bladder: Appears normal for degree of bladder distention. Other: Multiple hyperechoic lesions are noted throughout the liver, the largest measuring 8.1 cm in the posterior right liver. Two adjacent hyperechoic masses in the left liver measure 3.6 cm and 2.8 cm. IMPRESSION: 1. Atrophic and echogenic kidneys, right-greater-than-left, consistent with chronic medical renal disease. 2. Benign left renal cysts.  No follow-up imaging indicated. 3. Multiple hyperechoic lesions are noted throughout the liver, measuring up to 8.1 cm. When the patient is clinically stable and able to follow directions and hold their breath (preferably as an outpatient) further evaluation with dedicated abdominal MRI should be considered. Electronically Signed   By: Ileana Roup M.D.   On: 05/23/2022  09:48   CT HEAD WO CONTRAST  Result Date: 05/22/2022 CLINICAL DATA:  Delirium.  Altered mental status. EXAM: CT HEAD WITHOUT CONTRAST TECHNIQUE: Contiguous axial images were obtained from the base of the skull through the vertex without intravenous contrast. RADIATION DOSE REDUCTION: This exam was performed according  to the departmental dose-optimization program which includes automated exposure control, adjustment of the mA and/or kV according to patient size and/or use of iterative reconstruction technique. COMPARISON:  04/12/2020. FINDINGS: Brain: No evidence of acute infarction, hemorrhage, hydrocephalus, extra-axial collection or mass effect. No parenchymal masses. Isoattenuating extra-axial mass overlies the superior left frontal lobe, 2.4 x 1.1 x 2 cm, consistent with a meningioma, mildly enlarged compared to the CT and brain MRI dated 09/2018 where it measured 1.9 x 1.1 x 1.8 cm. Cavum septum pellucidum and vergae, incidental developmental anomaly, stable. Multiple old basal ganglia and deep white matter lacunar infarcts. Chronic microvascular ischemic change. Old left cerebellar infarct. Vascular: No hyperdense vessel or unexpected calcification. Skull: Normal. Negative for fracture or focal lesion. Sinuses/Orbits: Globes and orbits are unremarkable. Visualized sinuses are clear. Other: None. IMPRESSION: 1. No acute intracranial abnormalities. No change the most recent prior head CT. Electronically Signed   By: Lajean Manes M.D.   On: 05/22/2022 11:40   DG Chest Port 1 View  Result Date: 05/22/2022 CLINICAL DATA:  Altered mental status EXAM: PORTABLE CHEST 1 VIEW COMPARISON:  04/11/2020 FINDINGS: Chronic cardiomegaly and aortic tortuosity/elongation. Artifact from EKG leads. There is no edema, consolidation, effusion, or pneumothorax. No acute osseous finding. IMPRESSION: Stable exam.  No acute finding. Electronically Signed   By: Jorje Guild M.D.   On: 05/22/2022 11:16    Pending  Labs Unresulted Labs (From admission, onward)     Start     Ordered   05/23/22 0828  Hepatic function panel  Once,   R        05/23/22 0827   05/22/22 1636  Occult blood card to lab, stool  Once,   R        05/22/22 1635   05/22/22 1240  Ammonia  Once,   R        05/22/22 1240            Vitals/Pain Today's Vitals   05/23/22 0300 05/23/22 0600 05/23/22 0639 05/23/22 1132  BP: (!) 113/55 (!) 98/54  112/66  Pulse: (!) 112 96  92  Resp: '20 15  18  '$ Temp:   99 F (37.2 C) 98.7 F (37.1 C)  TempSrc:   Axillary Axillary  SpO2: 100% 100%  100%  Weight:      Height:      PainSc:        Isolation Precautions No active isolations  Medications Medications  dextrose 50 % solution 50 mL (50 mLs Intravenous Not Given 05/22/22 1052)  acetaminophen (TYLENOL) tablet 650 mg (has no administration in time range)    Or  acetaminophen (TYLENOL) suppository 650 mg (has no administration in time range)  ondansetron (ZOFRAN) tablet 4 mg (has no administration in time range)    Or  ondansetron (ZOFRAN) injection 4 mg (has no administration in time range)  dextrose 5 % solution (0 mLs Intravenous Stopped 05/23/22 0448)  ferric gluconate (FERRLECIT) 250 mg in sodium chloride 0.9 % 250 mL IVPB (250 mg Intravenous New Bag/Given 05/23/22 1119)  dextrose 5 % solution (has no administration in time range)  potassium chloride 10 mEq in 100 mL IVPB (has no administration in time range)  potassium chloride (KLOR-CON) packet 40 mEq (has no administration in time range)  sodium chloride 0.9 % bolus 1,000 mL (0 mLs Intravenous Stopped 05/22/22 1647)  pantoprazole (PROTONIX) injection 40 mg (40 mg Intravenous Given 05/22/22 1151)  0.9 %  sodium chloride infusion (Manually program via Guardrails IV  Fluids) (0 mLs Intravenous Stopped 05/22/22 2018)  lactated ringers bolus 500 mL (0 mLs Intravenous Stopped 05/22/22 1935)  0.9 %  sodium chloride infusion (Manually program via Guardrails IV Fluids) (0 mLs  Intravenous Stopped 05/23/22 0135)  lactated ringers bolus 500 mL (500 mLs Intravenous New Bag/Given 05/23/22 1114)    Mobility walks with device High fall risk    R Recommendations: See Admitting Provider Note

## 2022-05-23 NOTE — Progress Notes (Signed)
Gastroenterology Inpatient Follow Up    Subjective: Patient is oriented to person but not place or time. Denies ab pain. Denies blood in stools.  Objective: Vital signs in last 24 hours: Temp:  [97.9 F (36.6 C)-100.1 F (37.8 C)] 98.7 F (37.1 C) (10/02 1132) Pulse Rate:  [80-114] 92 (10/02 1132) Resp:  [14-35] 18 (10/02 1132) BP: (92-160)/(54-141) 112/66 (10/02 1132) SpO2:  [95 %-100 %] 100 % (10/02 1132)    Intake/Output from previous day: 10/01 0701 - 10/02 0700 In: 3573.6 [I.V.:965; Blood:1115.7; IV Piggyback:1493] Out: -  Intake/Output this shift: No intake/output data recorded.  General appearance: alert Resp: no increased WOB Cardio: regular rate GI: soft, non-tender; bowel sounds normal; no masses,  no organomegaly Extremities: no BLE edema  Lab Results: Recent Labs    05/22/22 1046 05/23/22 0459  WBC 8.7 9.3  HGB 4.4* 9.4*  HCT 15.5* 29.8*  PLT 512* 309   BMET Recent Labs    05/22/22 1046 05/23/22 0459  NA 148* 144  K 4.0 3.1*  CL 112* 112*  CO2 25 24  GLUCOSE 101* 103*  BUN 41* 28*  CREATININE 1.60* 1.28*  CALCIUM 9.5 8.7*   LFT Recent Labs    05/22/22 1046  PROT 6.6  ALBUMIN 2.3*  AST 90*  ALT 40  ALKPHOS 106  BILITOT 0.6   PT/INR No results for input(s): "LABPROT", "INR" in the last 72 hours. Hepatitis Panel No results for input(s): "HEPBSAG", "HCVAB", "HEPAIGM", "HEPBIGM" in the last 72 hours. C-Diff No results for input(s): "CDIFFTOX" in the last 72 hours.  Studies/Results: US RENAL  Result Date: 05/23/2022 CLINICAL DATA:  AKI. EXAM: RENAL / URINARY TRACT ULTRASOUND COMPLETE COMPARISON:  None Available. FINDINGS: Right Kidney: Renal measurements: 9.1 x 4.9 x 3.4 cm = volume: 77 mL. Diffuse cortical thinning with increased echogenicity. No mass or hydronephrosis visualized. Left Kidney: Renal measurements: 9.5 x 5.4 x 3.5 cm = volume: 150 mL. Mild diffuse cortical thinning and increased echogenicity. No hydronephrosis  visualized. Two exophytic cortical cysts, both measuring 1.2 cm, are noted at the middle third of the kidney. Bladder: Appears normal for degree of bladder distention. Other: Multiple hyperechoic lesions are noted throughout the liver, the largest measuring 8.1 cm in the posterior right liver. Two adjacent hyperechoic masses in the left liver measure 3.6 cm and 2.8 cm. IMPRESSION: 1. Atrophic and echogenic kidneys, right-greater-than-left, consistent with chronic medical renal disease. 2. Benign left renal cysts.  No follow-up imaging indicated. 3. Multiple hyperechoic lesions are noted throughout the liver, measuring up to 8.1 cm. When the patient is clinically stable and able to follow directions and hold their breath (preferably as an outpatient) further evaluation with dedicated abdominal MRI should be considered. Electronically Signed   By: Ileana Roup M.D.   On: 05/23/2022 09:48   CT HEAD WO CONTRAST  Result Date: 05/22/2022 CLINICAL DATA:  Delirium.  Altered mental status. EXAM: CT HEAD WITHOUT CONTRAST TECHNIQUE: Contiguous axial images were obtained from the base of the skull through the vertex without intravenous contrast. RADIATION DOSE REDUCTION: This exam was performed according to the departmental dose-optimization program which includes automated exposure control, adjustment of the mA and/or kV according to patient size and/or use of iterative reconstruction technique. COMPARISON:  04/12/2020. FINDINGS: Brain: No evidence of acute infarction, hemorrhage, hydrocephalus, extra-axial collection or mass effect. No parenchymal masses. Isoattenuating extra-axial mass overlies the superior left frontal lobe, 2.4 x 1.1 x 2 cm, consistent with a meningioma, mildly enlarged compared to  the CT and brain MRI dated 09/2018 where it measured 1.9 x 1.1 x 1.8 cm. Cavum septum pellucidum and vergae, incidental developmental anomaly, stable. Multiple old basal ganglia and deep white matter lacunar infarcts.  Chronic microvascular ischemic change. Old left cerebellar infarct. Vascular: No hyperdense vessel or unexpected calcification. Skull: Normal. Negative for fracture or focal lesion. Sinuses/Orbits: Globes and orbits are unremarkable. Visualized sinuses are clear. Other: None. IMPRESSION: 1. No acute intracranial abnormalities. No change the most recent prior head CT. Electronically Signed   By: Lajean Manes M.D.   On: 05/22/2022 11:40   DG Chest Port 1 View  Result Date: 05/22/2022 CLINICAL DATA:  Altered mental status EXAM: PORTABLE CHEST 1 VIEW COMPARISON:  04/11/2020 FINDINGS: Chronic cardiomegaly and aortic tortuosity/elongation. Artifact from EKG leads. There is no edema, consolidation, effusion, or pneumothorax. No acute osseous finding. IMPRESSION: Stable exam.  No acute finding. Electronically Signed   By: Jorje Guild M.D.   On: 05/22/2022 11:16    Medications: I have reviewed the patient's current medications. Scheduled:  dextrose  1 ampule Intravenous Once   potassium chloride  40 mEq Oral BID   Continuous:  dextrose     ferric gluconate (FERRLECIT) IVPB 250 mg (05/23/22 1119)   RTM:YTRZNBVAPOLID **OR** acetaminophen, ondansetron **OR** ondansetron (ZOFRAN) IV  Assessment/Plan: 88 year old male with history of dementia, hypothyroidism, and CVA on Plavix presented with AMS. We are consulted for anemia. Hb upon arrival was 4.4. He was given 3 U pRBCs and responded well with recheck of his Hb as 9.4. Renal U/S showed some hyperechoic lesions in the liver. Per H&P, patient has had some dark appearing stools. I attempted to call the patient's son twice today but both attempts went unanswered, and the patient's son does not have a voicemail set up in order to leave messages. Presumably patient's last dose of Plavix was likely yesterday 10/1 so may need to wait for Plavix washout  - Trend Hb and look for clinical signs of bleeding - Follow up results of CT A/P w/contrast - Will continue  to attempt to get in contact with family to discuss the idea of EGD. I think it is unlikely that the patient would be able to easily drink a full colonoscopy preparation so EGD could be pursued to begin with.   LOS: 1 day   Sharyn Creamer 05/23/2022, 1:12 PM

## 2022-05-23 NOTE — Evaluation (Signed)
Clinical/Bedside Swallow Evaluation Patient Details  Name: Patrick Rhodes MRN: [id number] Date of Birth: [birth year]  Today's Date: 05/23/2022 Time: SLP Start Time (ACUTE ONLY): 10 SLP Stop Time (ACUTE ONLY): 0539 SLP Time Calculation (min) (ACUTE ONLY): 15 min  Past Medical History:  Past Medical History:  Diagnosis Date   Dementia ([place])    High cholesterol    Hypertension    Hypothyroidism    Stroke [facility])    Past Surgical History:  Past Surgical History:  Procedure Laterality Date   INTRAMEDULLARY (IM) NAIL INTERTROCHANTERIC Right 04/13/2020   Procedure: INTRAMEDULLARY (IM) NAIL INTERTROCHANTRIC RIGHT FEMORAL NAIL;  Surgeon: Nicholes Stairs, MD;  Location: [place];  Service: Orthopedics;  Laterality: Right;   HPI:  Pt is a 88 yo male with PMH of dementia, hypertension, hypothyroidism, and two CVA's (last one being in 2013) who presented to the ed with confusion that has lasted about two weeks. Reported pt is not eating and drinking normally. Pt has suspected GI bleed and AMS secondary to dehydration. Previous clinical swallow evaluation 09/2018, no overt s/sx aspiration noted, thin liquids/Dys 1 diet ordered due to AMS.    Assessment / Plan / Recommendation  Clinical Impression  Pt. presents with functional swallowing. Oral mechanism exam is normal; he is edentulous.  He presents with adequate oral attention, the appearance of a brisk swallow response, and no s/s of aspiration after 6 oz of thin water.  No regular solids were offered given degree of AMS today. Recommend starting a pureed/dysphagia 1 diet, thin liquids; meds whole in puree.  SLP will follow briefly for diet advancement/safety. D/W RN. SLP Visit Diagnosis: Dysphagia, oral phase (R13.11)    Aspiration Risk  No limitations    Diet Recommendation  Dysphagia 1/thin liquids  Medication Administration: Whole meds with puree    Other  Recommendations Oral Care Recommendations: Oral care BID    Recommendations for  follow up therapy are one component of a multi-disciplinary discharge planning process, led by the attending physician.  Recommendations may be updated based on patient status, additional functional criteria and insurance authorization.  Follow up Recommendations No SLP follow up          Functional Status Assessment Patient has had a recent decline in their functional status and demonstrates the ability to make significant improvements in function in a reasonable and predictable amount of time.  Frequency and Duration min 2x/week  1 week       Prognosis Prognosis for Safe Diet Advancement: Good Barriers to Reach Goals: Cognitive deficits      Swallow Study   General Date of Onset: 05/22/22 HPI: Pt is a 88 yo male with PMH of dementia, hypertension, hypothyroidism, and two CVA's (last one being in 2013) who presented to the ed with confusion that has lasted about two weeks. Reported pt is not eating and drinking normally. Pt has suspected GI bleed and AMS secondary to dehydration. Previous clinical swallow evaluation 09/2018, no overt s/sx aspiration noted, thin liquids/Dys 1 diet ordered due to AMS. Type of Study: Bedside Swallow Evaluation Previous Swallow Assessment: see HPI Diet Prior to this Study: NPO Temperature Spikes Noted: No Respiratory Status: Room air History of Recent Intubation: No Behavior/Cognition: Alert;Confused Oral Cavity Assessment: Within Functional Limits Oral Care Completed by SLP: No Oral Cavity - Dentition: Edentulous Self-Feeding Abilities: Needs assist Patient Positioning: Upright in bed Baseline Vocal Quality: Normal Volitional Cough: Cognitively unable to elicit Volitional Swallow: Able to elicit    Oral/Motor/Sensory Function Overall Oral Motor/Sensory  Function: Within functional limits   Ice Chips Ice chips: Within functional limits   Thin Liquid Thin Liquid: Within functional limits    Nectar Thick Nectar Thick Liquid: Not tested   Honey  Thick Honey Thick Liquid: Not tested   Puree Puree: Within functional limits   Solid     Solid: Not tested      Juan Quam Laurice 05/23/2022,1:20 PM  Estill Bamberg L. Tivis Ringer, MA CCC/SLP Clinical Specialist - [place] Office number [phone]

## 2022-05-24 DIAGNOSIS — Z515 Encounter for palliative care: Secondary | ICD-10-CM

## 2022-05-24 DIAGNOSIS — K922 Gastrointestinal hemorrhage, unspecified: Secondary | ICD-10-CM | POA: Diagnosis not present

## 2022-05-24 DIAGNOSIS — N179 Acute kidney failure, unspecified: Secondary | ICD-10-CM | POA: Diagnosis not present

## 2022-05-24 DIAGNOSIS — C799 Secondary malignant neoplasm of unspecified site: Secondary | ICD-10-CM

## 2022-05-24 LAB — BPAM RBC
Blood Product Expiration Date: 202311032359
Blood Product Expiration Date: 202311032359
Blood Product Expiration Date: 202311032359
Blood Product Expiration Date: 202311032359
ISSUE DATE / TIME: 202310011250
ISSUE DATE / TIME: 202310011721
ISSUE DATE / TIME: 202310012201
Unit Type and Rh: 5100
Unit Type and Rh: 5100
Unit Type and Rh: 5100
Unit Type and Rh: 5100

## 2022-05-24 LAB — TYPE AND SCREEN
ABO/RH(D): O POS
Antibody Screen: NEGATIVE
Unit division: 0
Unit division: 0
Unit division: 0
Unit division: 0

## 2022-05-24 LAB — GLUCOSE, CAPILLARY
Glucose-Capillary: 37 mg/dL — CL (ref 70–99)
Glucose-Capillary: 89 mg/dL (ref 70–99)
Glucose-Capillary: 108 mg/dL — ABNORMAL HIGH (ref 70–99)
Glucose-Capillary: 96 mg/dL (ref 70–99)
Glucose-Capillary: 83 mg/dL (ref 70–99)
Glucose-Capillary: 74 mg/dL (ref 70–99)

## 2022-05-24 LAB — COMPREHENSIVE METABOLIC PANEL
ALT: 73 U/L — ABNORMAL HIGH (ref 0–44)
AST: 160 U/L — ABNORMAL HIGH (ref 15–41)
Albumin: 1.8 g/dL — ABNORMAL LOW (ref 3.5–5.0)
Alkaline Phosphatase: 254 U/L — ABNORMAL HIGH (ref 38–126)
Anion gap: 6 (ref 5–15)
BUN: 22 mg/dL (ref 8–23)
CO2: 23 mmol/L (ref 22–32)
Calcium: 8.3 mg/dL — ABNORMAL LOW (ref 8.9–10.3)
Chloride: 115 mmol/L — ABNORMAL HIGH (ref 98–111)
Creatinine, Ser: 1.03 mg/dL (ref 0.61–1.24)
GFR, Estimated: >60 mL/min (ref >60)
Glucose, Bld: 67 mg/dL — ABNORMAL LOW (ref 70–99)
Potassium: 4.5 mmol/L (ref 3.5–5.1)
Sodium: 144 mmol/L (ref 135–145)
Total Bilirubin: 6.7 mg/dL — ABNORMAL HIGH (ref 0.3–1.2)
Total Protein: 5.5 g/dL — ABNORMAL LOW (ref 6.5–8.1)

## 2022-05-24 LAB — CBC
HCT: 25.2 % — ABNORMAL LOW (ref 39.0–52.0)
Hemoglobin: 8.3 g/dL — ABNORMAL LOW (ref 13.0–17.0)
MCH: 24.3 pg — ABNORMAL LOW (ref 26.0–34.0)
MCHC: 32.9 g/dL (ref 30.0–36.0)
MCV: 73.7 fL — ABNORMAL LOW (ref 80.0–100.0)
Platelets: 276 10*3/uL (ref 150–400)
RBC: 3.42 MIL/uL — ABNORMAL LOW (ref 4.22–5.81)
RDW: 19.9 % — ABNORMAL HIGH (ref 11.5–15.5)
WBC: 23.1 10*3/uL — ABNORMAL HIGH (ref 4.0–10.5)
nRBC: 0.4 % — ABNORMAL HIGH (ref 0.0–0.2)

## 2022-05-24 LAB — OCCULT BLOOD X 1 CARD TO LAB, STOOL: Fecal Occult Bld: POSITIVE — AB

## 2022-05-24 MED ORDER — DEXTROSE-NACL 5-0.45 % IV SOLN: INTRAVENOUS | Status: AC

## 2022-05-24 NOTE — Progress Notes (Signed)
Subjective:   Summary: Patrick Rhodes is a 88 y.o. year old male currently admitted on the IMTS HD#2 for severe microcytic anemia.  Overnight Events: No overnight events  Patient was seen this morning bedside, he was able to recognize myself and members of the team.  However his speech was largely unintelligible.  Objective:  Vital signs in last 24 hours: Vitals:   05/23/22 2001 05/23/22 2341 05/24/22 0600 05/24/22 0808  BP: (!) 127/91 (!) 129/98 139/87 116/75  Pulse: 83 85 76 76  Resp: '17 18 18 16  '$ Temp: 97.8 F (36.6 C) 97.9 F (36.6 C)  (!) 97.4 F (36.3 C)  TempSrc: Oral  (P) Oral Oral  SpO2:  90% 99% 100%  Weight:      Height:       Supplemental O2: Room Air SpO2: 100 %   Physical Exam:  Constitutional: Thin, ill appearing male, unable to speak coherently, in no acute distress Cardiovascular: RRR, no murmurs, rubs or gallops Pulmonary/Chest: normal work of breathing on room air, lungs clear to auscultation bilaterally Abdominal: soft, non-tender, non-distended Skin: warm and dry Extremities: upper/lower extremity pulses 2+, no lower extremity edema present  Filed Weights   05/22/22 1022 05/23/22 1450  Weight: 59 kg 50.9 kg     Intake/Output Summary (Last 24 hours) at 05/24/2022 1127 Last data filed at 05/24/2022 0900 Gross per 24 hour  Intake 241.53 ml  Output --  Net 241.53 ml   Net IO Since Admission: 3,815.13 mL [05/24/22 1127]  Pertinent Labs:    Latest Ref Rng & Units 05/24/2022    4:57 AM 05/23/2022    4:59 AM 05/22/2022   10:46 AM  CBC  WBC 4.0 - 10.5 K/uL 23.1  9.3  8.7   Hemoglobin 13.0 - 17.0 g/dL 8.3  9.4  4.4   Hematocrit 39.0 - 52.0 % 25.2  29.8  15.5   Platelets 150 - 400 K/uL 276  309  512        Latest Ref Rng & Units 05/24/2022    4:57 AM 05/23/2022    9:00 AM 05/23/2022    4:59 AM  CMP  Glucose 70 - 99 mg/dL 67   103   BUN 8 - 23 mg/dL 22   28   Creatinine 0.61 - 1.24 mg/dL 1.03   1.28   Sodium 135 - 145  mmol/L 144   144   Potassium 3.5 - 5.1 mmol/L 4.5   3.1   Chloride 98 - 111 mmol/L 115   112   CO2 22 - 32 mmol/L 23   24   Calcium 8.9 - 10.3 mg/dL 8.3   8.7   Total Protein 6.5 - 8.1 g/dL 5.5  6.3    Total Bilirubin 0.3 - 1.2 mg/dL 6.7  6.1    Alkaline Phos 38 - 126 U/L 254  323    AST 15 - 41 U/L 160  261    ALT 0 - 44 U/L 73  81       Imaging: CT ABDOMEN PELVIS W CONTRAST  Result Date: 05/23/2022 CLINICAL DATA:  Occult malignancy; * Tracking Code: BO * EXAM: CT ABDOMEN AND PELVIS WITH CONTRAST TECHNIQUE: Multidetector CT imaging of the abdomen and pelvis was performed using the standard protocol following bolus administration of intravenous contrast. RADIATION DOSE REDUCTION: This exam was performed according to the departmental dose-optimization program which includes automated exposure  control, adjustment of the mA and/or kV according to patient size and/or use of iterative reconstruction technique. CONTRAST:  9m OMNIPAQUE IOHEXOL 350 MG/ML SOLN COMPARISON:  Renal ultrasound dated May 23, 2022 FINDINGS: Lower chest: Solid nodule of the right lower lobe measuring 1.9 x 1.2 cm on series 5, image 10. Cardiomegaly. Hepatobiliary: Numerous irregular low-attenuation liver lesions. Largest is located in the right hepatic lobe and measures 7.0 x 6.5 cm. Additional reference lesion of the left lobe of the liver measuring 4.5 x 2.6 cm. Hydropic gallbladder with no evidence of wall thickening. No biliary ductal dilation. Pancreas: Unremarkable. No pancreatic ductal dilatation or surrounding inflammatory changes. Spleen: Normal in size without focal abnormality. Adrenals/Urinary Tract: Bilateral adrenal glands are unremarkable. No hydronephrosis or nephrolithiasis. Scattered bilateral low-attenuation renal lesions, largest are compatible with simple cysts, others are too small to completely characterize no specific follow-up imaging is recommended for these lesions. Stomach/Bowel: Large stool ball  seen in the rectum, no surrounding rectal wall thickening or inflammatory change. Area of soft tissue thickening involving the proximal greater curvature of the stomach measuring 1.9 cm. Vascular/Lymphatic: Aortic atherosclerosis. Mildly enlarged gastrohepatic ligament lymph node measuring 1.1 cm on series 3, image 23. Reproductive: Prostatomegaly, measuring up to 5.9 cm. Other: Enhancing nodular right periareolar soft tissue measuring 1.8 x 0.9 cm on series 3 image 5. Trace perihepatic ascites. Musculoskeletal: Intramedullary rod of the proximal right femur. Large well corticated osteophyte of the left iliac crest. No aggressive appearing osseous lesions. IMPRESSION: 1. Area of focal soft tissue thickening involving the proximal greater curvature of the stomach, concerning for gastric malignancy. Recommend endoscopy for further evaluation. 2. Enhancing nodular right periareolar soft tissue, concerning for male breast cancer versus soft tissue metastatic disease. Recommend correlation with mammography. 3. Numerous irregular low-attenuation liver lesions which were solid-appearing on prior ultrasound, highly concerning for metastatic disease. 4. Solid nodule of the right lower lobe, highly concerning for pulmonary metastatic disease. 5. Mildly enlarged gastrohepatic ligament lymph node, concerning for nodal metastatic disease. 6. Trace perihepatic ascites. 7. Hydropic gallbladder with no gallbladder wall thickening. If there are symptoms of right upper quadrant pain, could be further evaluated with gallbladder ultrasound. 8. Large stool ball in the rectum, compatible with constipation. 9. Aortic Atherosclerosis (ICD10-I70.0). Electronically Signed   By: LYetta GlassmanM.D.   On: 05/23/2022 14:43     Assessment/Plan:   Principal Problem:   GIB (gastrointestinal bleeding) Active Problems:   AKI (acute kidney injury) ([facility]   Patient Summary: Patrick SANTERREis a 88y.o. with a pertinent PMH of  hypertension, hypothyroidism, 2 CVAs, who presented with decreased function ability and admitted for severe microcytic anemia.   #Severe microcytic anemia, suspected GI malignancy #Liver Lesions #Pulmonary Nodule Patient's hemoglobin decreased from 9.4 to 8.3 today, likely in the setting of suspected GI malignancy and chronic slow bleed.  Patient was able to state that he recognizes myself, however functional status does not seem to have changed within the past day.  CT of abdomen results have revealed a thickening of the soft tissue involving the proximal greater curvature of the stomach which is concerning for gastric malignancy, enhancing nodular right perihilar soft tissue which is concerning for soft tissue metastatic disease, numerous irregular low-attenuation liver lesions also concerning for metastatic disease, and solid nodule of the right lower lobe concerning for pulmonary metastatic disease.   His AST is increased to 160, ALT was increased to 73, and it is increased to 254.  New leukocytosis as well has  white blood cell count is up to 23.1  At this point, per GI, he is scheduled for EGD for biopsy to confirm the diagnosis of gastric malignancy.  Patient's son was not bedside this AM, however will speak with patient regarding his options on pursuing treatment or switching to comfort care, and also CODE STATUS.   Plan: - EGD planned for Thursday, October 5 - We will present options for care to his son who is his primary decision maker  #AKI, prerenal Most likely in the setting of poor p.o. intake.  Creatinine yesterday was 1.28, today is 1.03 with back within normal limits.   #Hypertension Patient's blood pressure has been stable, this a.m. was 129/98.  We will continue to hold antihypertensive medications.  #Hypokalemia (resolved)   #AMS in the setting of dehydration and severe microcytic anemia  Code: Full   Dispo: Anticipated discharge in less than 2 midnights. Drucie Opitz, MD PGY-1 Internal Medicine Resident Pager Number [phone] Please contact the on call pager after 5 pm and on weekends at [phone].

## 2022-05-24 NOTE — Progress Notes (Signed)
Gastroenterology Inpatient Follow Up    Subjective: Patient is resting comfortably  Objective: Vital signs in last 24 hours: Temp:  [97.4 F (36.3 C)-98.7 F (37.1 C)] 97.4 F (36.3 C) (10/03 0808) Pulse Rate:  [76-92] 76 (10/03 0808) Resp:  [16-18] 16 (10/03 0808) BP: (112-139)/(66-98) 116/75 (10/03 0808) SpO2:  [90 %-100 %] 100 % (10/03 0808) Weight:  [50.9 kg] 50.9 kg (10/02 1450) Last BM Date : 05/23/22  Intake/Output from previous day: 10/02 0701 - 10/03 0700 In: 1.5 [IV Piggyback:1.5] Out: -  Intake/Output this shift: Total I/O In: 240 [P.O.:240] Out: -   General appearance: alert Resp: no increased WOB Cardio: regular rate GI: soft, non-tender; bowel sounds normal; no masses,  no organomegaly Extremities: no BLE edema  Lab Results: Recent Labs    05/22/22 1046 05/23/22 0459 05/24/22 0457  WBC 8.7 9.3 23.1*  HGB 4.4* 9.4* 8.3*  HCT 15.5* 29.8* 25.2*  PLT 512* 309 276   BMET Recent Labs    05/22/22 1046 05/23/22 0459 05/24/22 0457  NA 148* 144 144  K 4.0 3.1* 4.5  CL 112* 112* 115*  CO2 '25 24 23  '$ GLUCOSE 101* 103* 67*  BUN 41* 28* 22  CREATININE 1.60* 1.28* 1.03  CALCIUM 9.5 8.7* 8.3*   LFT Recent Labs    05/23/22 0900 05/24/22 0457  PROT 6.3* 5.5*  ALBUMIN 2.1* 1.8*  AST 261* 160*  ALT 81* 73*  ALKPHOS 323* 254*  BILITOT 6.1* 6.7*  BILIDIR 3.8*  --   IBILI 2.3*  --    PT/INR No results for input(s): "LABPROT", "INR" in the last 72 hours. Hepatitis Panel No results for input(s): "HEPBSAG", "HCVAB", "HEPAIGM", "HEPBIGM" in the last 72 hours. C-Diff No results for input(s): "CDIFFTOX" in the last 72 hours.  Studies/Results: CT ABDOMEN PELVIS W CONTRAST  Result Date: 05/23/2022 CLINICAL DATA:  Occult malignancy; * Tracking Code: BO * EXAM: CT ABDOMEN AND PELVIS WITH CONTRAST TECHNIQUE: Multidetector CT imaging of the abdomen and pelvis was performed using the standard protocol following bolus administration of intravenous  contrast. RADIATION DOSE REDUCTION: This exam was performed according to the departmental dose-optimization program which includes automated exposure control, adjustment of the mA and/or kV according to patient size and/or use of iterative reconstruction technique. CONTRAST:  23m OMNIPAQUE IOHEXOL 350 MG/ML SOLN COMPARISON:  Renal ultrasound dated May 23, 2022 FINDINGS: Lower chest: Solid nodule of the right lower lobe measuring 1.9 x 1.2 cm on series 5, image 10. Cardiomegaly. Hepatobiliary: Numerous irregular low-attenuation liver lesions. Largest is located in the right hepatic lobe and measures 7.0 x 6.5 cm. Additional reference lesion of the left lobe of the liver measuring 4.5 x 2.6 cm. Hydropic gallbladder with no evidence of wall thickening. No biliary ductal dilation. Pancreas: Unremarkable. No pancreatic ductal dilatation or surrounding inflammatory changes. Spleen: Normal in size without focal abnormality. Adrenals/Urinary Tract: Bilateral adrenal glands are unremarkable. No hydronephrosis or nephrolithiasis. Scattered bilateral low-attenuation renal lesions, largest are compatible with simple cysts, others are too small to completely characterize no specific follow-up imaging is recommended for these lesions. Stomach/Bowel: Large stool ball seen in the rectum, no surrounding rectal wall thickening or inflammatory change. Area of soft tissue thickening involving the proximal greater curvature of the stomach measuring 1.9 cm. Vascular/Lymphatic: Aortic atherosclerosis. Mildly enlarged gastrohepatic ligament lymph node measuring 1.1 cm on series 3, image 23. Reproductive: Prostatomegaly, measuring up to 5.9 cm. Other: Enhancing nodular right periareolar soft tissue measuring 1.8 x 0.9 cm on series  3 image 5. Trace perihepatic ascites. Musculoskeletal: Intramedullary rod of the proximal right femur. Large well corticated osteophyte of the left iliac crest. No aggressive appearing osseous lesions.  IMPRESSION: 1. Area of focal soft tissue thickening involving the proximal greater curvature of the stomach, concerning for gastric malignancy. Recommend endoscopy for further evaluation. 2. Enhancing nodular right periareolar soft tissue, concerning for male breast cancer versus soft tissue metastatic disease. Recommend correlation with mammography. 3. Numerous irregular low-attenuation liver lesions which were solid-appearing on prior ultrasound, highly concerning for metastatic disease. 4. Solid nodule of the right lower lobe, highly concerning for pulmonary metastatic disease. 5. Mildly enlarged gastrohepatic ligament lymph node, concerning for nodal metastatic disease. 6. Trace perihepatic ascites. 7. Hydropic gallbladder with no gallbladder wall thickening. If there are symptoms of right upper quadrant pain, could be further evaluated with gallbladder ultrasound. 8. Large stool ball in the rectum, compatible with constipation. 9. Aortic Atherosclerosis (ICD10-I70.0). Electronically Signed   By: Yetta Glassman M.D.   On: 05/23/2022 14:43   US RENAL  Result Date: 05/23/2022 CLINICAL DATA:  AKI. EXAM: RENAL / URINARY TRACT ULTRASOUND COMPLETE COMPARISON:  None Available. FINDINGS: Right Kidney: Renal measurements: 9.1 x 4.9 x 3.4 cm = volume: 77 mL. Diffuse cortical thinning with increased echogenicity. No mass or hydronephrosis visualized. Left Kidney: Renal measurements: 9.5 x 5.4 x 3.5 cm = volume: 150 mL. Mild diffuse cortical thinning and increased echogenicity. No hydronephrosis visualized. Two exophytic cortical cysts, both measuring 1.2 cm, are noted at the middle third of the kidney. Bladder: Appears normal for degree of bladder distention. Other: Multiple hyperechoic lesions are noted throughout the liver, the largest measuring 8.1 cm in the posterior right liver. Two adjacent hyperechoic masses in the left liver measure 3.6 cm and 2.8 cm. IMPRESSION: 1. Atrophic and echogenic kidneys,  right-greater-than-left, consistent with chronic medical renal disease. 2. Benign left renal cysts.  No follow-up imaging indicated. 3. Multiple hyperechoic lesions are noted throughout the liver, measuring up to 8.1 cm. When the patient is clinically stable and able to follow directions and hold their breath (preferably as an outpatient) further evaluation with dedicated abdominal MRI should be considered. Electronically Signed   By: Ileana Roup M.D.   On: 05/23/2022 09:48   CT HEAD WO CONTRAST  Result Date: 05/22/2022 CLINICAL DATA:  Delirium.  Altered mental status. EXAM: CT HEAD WITHOUT CONTRAST TECHNIQUE: Contiguous axial images were obtained from the base of the skull through the vertex without intravenous contrast. RADIATION DOSE REDUCTION: This exam was performed according to the departmental dose-optimization program which includes automated exposure control, adjustment of the mA and/or kV according to patient size and/or use of iterative reconstruction technique. COMPARISON:  04/12/2020. FINDINGS: Brain: No evidence of acute infarction, hemorrhage, hydrocephalus, extra-axial collection or mass effect. No parenchymal masses. Isoattenuating extra-axial mass overlies the superior left frontal lobe, 2.4 x 1.1 x 2 cm, consistent with a meningioma, mildly enlarged compared to the CT and brain MRI dated 09/2018 where it measured 1.9 x 1.1 x 1.8 cm. Cavum septum pellucidum and vergae, incidental developmental anomaly, stable. Multiple old basal ganglia and deep white matter lacunar infarcts. Chronic microvascular ischemic change. Old left cerebellar infarct. Vascular: No hyperdense vessel or unexpected calcification. Skull: Normal. Negative for fracture or focal lesion. Sinuses/Orbits: Globes and orbits are unremarkable. Visualized sinuses are clear. Other: None. IMPRESSION: 1. No acute intracranial abnormalities. No change the most recent prior head CT. Electronically Signed   By: Lajean Manes M.D.   On:  05/22/2022 11:40    Medications: I have reviewed the patient's current medications. Scheduled:  potassium chloride  40 mEq Oral BID   Continuous:  dextrose 5 % and 0.45% NaCl 100 mL/hr at 05/24/22 0636   QZE:[id number] **OR** acetaminophen, ondansetron **OR** ondansetron (ZOFRAN) IV  Assessment/Plan: 88 year old male with history of dementia, hypothyroidism, and CVA on Plavix presented with AMS. We are consulted for anemia. Hb upon arrival was 4.4. He was given 3 U pRBCs and responded well with recheck of his Hb as 9.4. Renal U/S showed some hyperechoic lesions in the liver. CT A/P showed area of focal soft tissue thickening involving the proximal greater curvature of the stomach that is concerning for stomach cancer. CT also showed findings concerning for metastatic disease in the right periareolar soft tissue, liver, right lower lobe of the lung, and lymph nodes. I discussed the findings of the CT scan with the patient's son over the phone. I also went over in detail the risks and benefits of the EGD procedure with the patient's son.  Risks were outlined as including, but not limited to, bleeding, infection, perforation, and adverse medication reaction leading to cardiac or pulmonary decompensation. Patient's son would like the patient to proceed with EGD for further evaluation.  - Trend Hb - Plan for EGD on 10/5 to allow for full Plavix wash out. Last dose of Plavix per patient's son was 9/30. Will need to be NPO after MN before the procedure.   LOS: 2 days   Sharyn Creamer 05/24/2022, 11:12 AM

## 2022-05-24 NOTE — Progress Notes (Signed)
   Hypoglycemic Event  CBG: 37  Treatment: D50 50 mL (25 gm) and D5 1/2 NS '@100'$  started  Symptoms: None  Follow-up CBG: [id number] CBG Result:89  Possible Reasons for Event: Inadequate meal intake  Comments/MD notified:yes    Pete Glatter

## 2022-05-24 NOTE — TOC Initial Note (Signed)
Transition of Care [facility]) - Initial/Assessment Note    Patient Details  Name: Patrick Rhodes MRN: [id number] Date of Birth: 1930-05-10  [facility] CM/SW Contact:    Curlene Labrum, RN Phone Number: 05/24/2022, 1:58 PM  Clinonical Narrative:                 CM met with the patient at the bedside and patient unable to participate with transitions of care needs assessment and discussion.  The patient's son, Patrick Rhodes was called and he states that the patient was living at home and [facility] is providing services for home health aide services at the home Monday - Friday 12 pm - 3 pm.  The patient is transported to the day facility at [facility] during the week days on Tuesday and Thursday.  The patient has DME at home including hospital bed, wheelchair, cane, shower chair and motorized recliner at the home.  The patient will need transportation home by [facility] when he is discharged home.  CM to call Patrick Rhodes, MSW with [facility] [phone] -to coordinate transportation to home once the patient is medically stable to discharge to home - pending discharge date.  The patient recently received PRBC's for low hemoglobin and is scheduled for EGD with GI MD.  I called and spoke with Panel, MSW at [facility] and they are able to provide palliative care services at the home and increase nursing care support as needed.  PCP through [facility] - Dr. Ezequiel Kayser.  CM will continue to follow the patient for discharge needs for return to home with family - [facility].  Expected Discharge Plan: [place] Barriers to Discharge: Continued Medical Work up   Patient Goals and CMS Choice Patient states their goals for this hospitalization and ongoing recovery are:: To return home with [facility] CMS Medicare.gov Compare Post Acute Care list provided to:: Legal Guardian Patrick Rhodes, son [phone]) Choice offered to / list presented  to : Adult Children  Expected Discharge Plan and Services Expected Discharge Plan: [place]   Discharge Planning Services: CM Consult Post Acute Care Choice: Resumption of Svcs/PTA Provider Living arrangements for the past 2 months: [place]:  ([facility] - [phone])        Prior Living Arrangements/Services Living arrangements for the past 2 months: [place] with:: Adult Children (Lives at home with son - Patrick Rhodes, son's girlfriend) Patient language and need for interpreter reviewed:: Yes Do you feel safe going back to the place where you live?: Yes      Need for Family Participation in Patient Care: Yes (Comment) Care giver support system in place?: Yes (comment) Current home services: DME, Homehealth aide (DME at home includes hospital bed, lift chair recliner, wheelchair, cane - Home Health aide services through [facility]) Criminal Activity/Legal Involvement Pertinent to Current Situation/Hospitalization: No - Comment as needed  Activities of Daily Living      Permission Sought/Granted Permission sought to share information with : Case Manager, Family Supports, Customer service manager Permission granted to share information with : Yes, Verbal Permission Granted     Permission granted to share info w AGENCY: [facility]  Permission granted to share  info w Relationship: Patrick Rhodes, son - [phone]     Emotional Assessment Appearance:: Appears stated age Attitude/Demeanor/Rapport: Gracious Affect (typically observed): Accepting Orientation: : Oriented to Self Alcohol / Substance Use: Not Applicable Psych Involvement: No (comment)  Admission diagnosis:  Dehydration [E86.0] GIB (gastrointestinal bleeding) [K92.2] Symptomatic anemia [D64.9] Patient Active Problem List   Diagnosis Date Noted   AKI (acute kidney injury) ([place]) 05/23/2022   GIB (gastrointestinal  bleeding) 05/22/2022   Hip fracture ([place]) 04/12/2020   Cerebellar stroke ([place]) 10/15/2018   Pneumonia of left lower lobe due to infectious organism    Infectious encephalopathy    Hypercalcemia    Sepsis ([place]) 10/13/2018   PCP:  Pcp, No Pharmacy:   [id number] [address] [place] [place]Phone: [phone]: [phone]    Social Determinants of Health (SDOH) Interventions    Readmission Risk Interventions    05/24/2022   11:49 AM  Readmission Risk Prevention Plan  Post Dischage Appt Complete  Medication Screening Complete  Transportation Screening Complete

## 2022-05-25 DIAGNOSIS — Z7189 Other specified counseling: Secondary | ICD-10-CM

## 2022-05-25 DIAGNOSIS — Z515 Encounter for palliative care: Secondary | ICD-10-CM | POA: Diagnosis not present

## 2022-05-25 DIAGNOSIS — C799 Secondary malignant neoplasm of unspecified site: Secondary | ICD-10-CM

## 2022-05-25 DIAGNOSIS — Z66 Do not resuscitate: Secondary | ICD-10-CM

## 2022-05-25 DIAGNOSIS — K922 Gastrointestinal hemorrhage, unspecified: Secondary | ICD-10-CM | POA: Diagnosis not present

## 2022-05-25 DIAGNOSIS — D649 Anemia, unspecified: Secondary | ICD-10-CM

## 2022-05-25 LAB — CBC
HCT: 28.8 % — ABNORMAL LOW (ref 39.0–52.0)
Hemoglobin: 8.9 g/dL — ABNORMAL LOW (ref 13.0–17.0)
MCH: 23.7 pg — ABNORMAL LOW (ref 26.0–34.0)
MCHC: 30.9 g/dL (ref 30.0–36.0)
MCV: 76.6 fL — ABNORMAL LOW (ref 80.0–100.0)
Platelets: 265 10*3/uL (ref 150–400)
RBC: 3.76 MIL/uL — ABNORMAL LOW (ref 4.22–5.81)
RDW: 20.8 % — ABNORMAL HIGH (ref 11.5–15.5)
WBC: 16.9 10*3/uL — ABNORMAL HIGH (ref 4.0–10.5)
nRBC: 0.3 % — ABNORMAL HIGH (ref 0.0–0.2)

## 2022-05-25 LAB — URINALYSIS, COMPLETE (UACMP) WITH MICROSCOPIC
Bacteria, UA: NONE SEEN
Bilirubin Urine: NEGATIVE
Glucose, UA: NEGATIVE mg/dL
Hgb urine dipstick: NEGATIVE
Ketones, ur: NEGATIVE mg/dL
Leukocytes,Ua: NEGATIVE
Nitrite: NEGATIVE
Protein, ur: NEGATIVE mg/dL
Specific Gravity, Urine: 1.017 (ref 1.005–1.030)
pH: 6 (ref 5.0–8.0)

## 2022-05-25 LAB — COMPREHENSIVE METABOLIC PANEL
ALT: 91 U/L — ABNORMAL HIGH (ref 0–44)
AST: 207 U/L — ABNORMAL HIGH (ref 15–41)
Albumin: 1.8 g/dL — ABNORMAL LOW (ref 3.5–5.0)
Alkaline Phosphatase: 259 U/L — ABNORMAL HIGH (ref 38–126)
Anion gap: 11 (ref 5–15)
BUN: 15 mg/dL (ref 8–23)
CO2: 21 mmol/L — ABNORMAL LOW (ref 22–32)
Calcium: 8.8 mg/dL — ABNORMAL LOW (ref 8.9–10.3)
Chloride: 112 mmol/L — ABNORMAL HIGH (ref 98–111)
Creatinine, Ser: 0.9 mg/dL (ref 0.61–1.24)
GFR, Estimated: >60 mL/min (ref >60)
Glucose, Bld: 93 mg/dL (ref 70–99)
Potassium: 4 mmol/L (ref 3.5–5.1)
Sodium: 144 mmol/L (ref 135–145)
Total Bilirubin: 3.5 mg/dL — ABNORMAL HIGH (ref 0.3–1.2)
Total Protein: 5.5 g/dL — ABNORMAL LOW (ref 6.5–8.1)

## 2022-05-25 LAB — GLUCOSE, CAPILLARY
Glucose-Capillary: 74 mg/dL (ref 70–99)
Glucose-Capillary: 83 mg/dL (ref 70–99)
Glucose-Capillary: 90 mg/dL (ref 70–99)
Glucose-Capillary: 99 mg/dL (ref 70–99)

## 2022-05-25 NOTE — Care Management Important Message (Signed)
Important Message  Patient Details  Name: Patrick Rhodes MRN: [id number] Date of Birth: [birth year]   Medicare Important Message Given:  Yes     [name] 05/25/2022, 3:05 PM

## 2022-05-25 NOTE — Progress Notes (Signed)
Speech Language Pathology Treatment: Dysphagia  Patient Details Name: ARRIS MEYN MRN: [id number] DOB: 10-Dec-1929 Today's Date: 05/25/2022 Time: 1841-0857 SLP Time Calculation (min) (ACUTE ONLY): 8 min  Assessment / Plan / Recommendation Clinical Impression  Pt seen for dysphagia therapy needing repetition of information and consoling intermittently during session. He was unable to masticate small piece of peach provided noted by prolonged mastication, periods of oral holding and ultimately expectorated when requested. There were no overt s/s aspiration with straw sips thin liquid. He is currently being worked up for potential stomach and/or male breast cancer. Recommend he continue puree (Dys 1), thin liquids, crush pills and full supervision with meals. Do not recommend continued ST at this time. If his mental status improves and exhibits ability to upgrade diet, medical staff can upgrade at that time.    HPI HPI: Pt is a 88 yo male with PMH of dementia, hypertension, hypothyroidism, and two CVA's (last one being in 2013) who presented to the ed with confusion that has lasted about two weeks. Reported pt is not eating and drinking normally. Pt has suspected GI bleed and AMS secondary to dehydration. Previous clinical swallow evaluation 09/2018, no overt s/sx aspiration noted, thin liquids/Dys 1 diet ordered due to AMS.      SLP Plan  All goals met      Recommendations for follow up therapy are one component of a multi-disciplinary discharge planning process, led by the attending physician.  Recommendations may be updated based on patient status, additional functional criteria and insurance authorization.    Recommendations  Diet recommendations: Dysphagia 1 (puree);Thin liquid Liquids provided via: Cup;Straw Medication Administration: Crushed with puree Supervision: Staff to assist with self feeding Compensations: Slow rate;Small sips/bites;Minimize environmental distractions;Lingual  sweep for clearance of pocketing Postural Changes and/or Swallow Maneuvers: Seated upright 90 degrees                Oral Care Recommendations: Oral care BID Follow Up Recommendations: No SLP follow up Assistance recommended at discharge: None SLP Visit Diagnosis: Dysphagia, oral phase (R13.11) Plan: All goals met           Houston Siren  05/25/2022, 10:39 AM

## 2022-05-25 NOTE — Progress Notes (Addendum)
Gastroenterology Inpatient Follow Up    Subjective: Patient does appear to have some abdominal discomfort upon palpation  Objective: Vital signs in last 24 hours: Temp:  [97.6 F (36.4 C)-98.4 F (36.9 C)] 98.4 F (36.9 C) (10/04 0739) Pulse Rate:  [82-94] 89 (10/04 0739) Resp:  [16-20] 18 (10/04 0739) BP: (122-150)/(74-101) 133/90 (10/04 0739) SpO2:  [96 %-100 %] 96 % (10/04 0739) Last BM Date : 05/23/22  Intake/Output from previous day: 10/03 0701 - 10/04 0700 In: 2113.8 [P.O.:240; I.V.:1873.8] Out: -  Intake/Output this shift: Total I/O In: -  Out: 100 [Urine:100]  General appearance: alert Resp: no increased WOB Cardio: regular rate GI: soft, does appear to be uncomfortable when palpation his abdomen Extremities: no BLE edema  Lab Results: Recent Labs    05/23/22 0459 05/24/22 0457 05/25/22 0545  WBC 9.3 23.1* 16.9*  HGB 9.4* 8.3* 8.9*  HCT 29.8* 25.2* 28.8*  PLT 309 276 265   BMET Recent Labs    05/23/22 0459 05/24/22 0457 05/25/22 0545  NA 144 144 144  K 3.1* 4.5 4.0  CL 112* 115* 112*  CO2 24 23 21*  GLUCOSE 103* 67* 93  BUN 28* 22 15  CREATININE 1.28* 1.03 0.90  CALCIUM 8.7* 8.3* 8.8*   LFT Recent Labs    05/23/22 0900 05/24/22 0457 05/25/22 0545  PROT 6.3*   < > 5.5*  ALBUMIN 2.1*   < > 1.8*  AST 261*   < > 207*  ALT 81*   < > 91*  ALKPHOS 323*   < > 259*  BILITOT 6.1*   < > 3.5*  BILIDIR 3.8*  --   --   IBILI 2.3*  --   --    < > = values in this interval not displayed.   PT/INR No results for input(s): "LABPROT", "INR" in the last 72 hours. Hepatitis Panel No results for input(s): "HEPBSAG", "HCVAB", "HEPAIGM", "HEPBIGM" in the last 72 hours. C-Diff No results for input(s): "CDIFFTOX" in the last 72 hours.  Studies/Results: CT ABDOMEN PELVIS W CONTRAST  Result Date: 05/23/2022 CLINICAL DATA:  Occult malignancy; * Tracking Code: BO * EXAM: CT ABDOMEN AND PELVIS WITH CONTRAST TECHNIQUE: Multidetector CT imaging of the  abdomen and pelvis was performed using the standard protocol following bolus administration of intravenous contrast. RADIATION DOSE REDUCTION: This exam was performed according to the departmental dose-optimization program which includes automated exposure control, adjustment of the mA and/or kV according to patient size and/or use of iterative reconstruction technique. CONTRAST:  12m OMNIPAQUE IOHEXOL 350 MG/ML SOLN COMPARISON:  Renal ultrasound dated May 23, 2022 FINDINGS: Lower chest: Solid nodule of the right lower lobe measuring 1.9 x 1.2 cm on series 5, image 10. Cardiomegaly. Hepatobiliary: Numerous irregular low-attenuation liver lesions. Largest is located in the right hepatic lobe and measures 7.0 x 6.5 cm. Additional reference lesion of the left lobe of the liver measuring 4.5 x 2.6 cm. Hydropic gallbladder with no evidence of wall thickening. No biliary ductal dilation. Pancreas: Unremarkable. No pancreatic ductal dilatation or surrounding inflammatory changes. Spleen: Normal in size without focal abnormality. Adrenals/Urinary Tract: Bilateral adrenal glands are unremarkable. No hydronephrosis or nephrolithiasis. Scattered bilateral low-attenuation renal lesions, largest are compatible with simple cysts, others are too small to completely characterize no specific follow-up imaging is recommended for these lesions. Stomach/Bowel: Large stool ball seen in the rectum, no surrounding rectal wall thickening or inflammatory change. Area of soft tissue thickening involving the proximal greater curvature of the stomach  measuring 1.9 cm. Vascular/Lymphatic: Aortic atherosclerosis. Mildly enlarged gastrohepatic ligament lymph node measuring 1.1 cm on series 3, image 23. Reproductive: Prostatomegaly, measuring up to 5.9 cm. Other: Enhancing nodular right periareolar soft tissue measuring 1.8 x 0.9 cm on series 3 image 5. Trace perihepatic ascites. Musculoskeletal: Intramedullary rod of the proximal right femur.  Large well corticated osteophyte of the left iliac crest. No aggressive appearing osseous lesions. IMPRESSION: 1. Area of focal soft tissue thickening involving the proximal greater curvature of the stomach, concerning for gastric malignancy. Recommend endoscopy for further evaluation. 2. Enhancing nodular right periareolar soft tissue, concerning for male breast cancer versus soft tissue metastatic disease. Recommend correlation with mammography. 3. Numerous irregular low-attenuation liver lesions which were solid-appearing on prior ultrasound, highly concerning for metastatic disease. 4. Solid nodule of the right lower lobe, highly concerning for pulmonary metastatic disease. 5. Mildly enlarged gastrohepatic ligament lymph node, concerning for nodal metastatic disease. 6. Trace perihepatic ascites. 7. Hydropic gallbladder with no gallbladder wall thickening. If there are symptoms of right upper quadrant pain, could be further evaluated with gallbladder ultrasound. 8. Large stool ball in the rectum, compatible with constipation. 9. Aortic Atherosclerosis (ICD10-I70.0). Electronically Signed   By: Yetta Glassman M.D.   On: 05/23/2022 14:43    Medications: I have reviewed the patient's current medications. Scheduled:  potassium chloride  40 mEq Oral BID   Continuous:   [id number] **OR** acetaminophen, ondansetron **OR** ondansetron (ZOFRAN) IV  Assessment/Plan: 88 year old male with history of dementia, hypothyroidism, and CVA on Plavix presented with AMS. We are consulted for anemia. Hb upon arrival was 4.4. He was given 3 U pRBCs and responded well with recheck of his Hb as 9.4. Renal U/S showed some hyperechoic lesions in the liver. CT A/P showed area of focal soft tissue thickening involving the proximal greater curvature of the stomach that is concerning for stomach cancer. CT also showed findings concerning for metastatic disease in the right periareolar soft tissue, liver, right lower lobe  of the lung, and lymph nodes. Although EGD was originally planned for tomorrow, I was notified this afternoon that the patient's son opted to pursue hospice/palliative care and no longer wishes for his father to get an EGD. Thus we will cancel our EGD for tomorrow. GI will sign off for now but please feel free to re-involve [place] if any new questions/concerns arise.   LOS: 3 days   Sharyn Creamer 05/25/2022, 10:00 AM

## 2022-05-25 NOTE — Progress Notes (Signed)
Consultation Note Date: 05/25/2022   Patient Name: Patrick Rhodes  DOB: 1929/09/25  MRN: [id number]  Age / Sex: 88 y.o., male  PCP: Pcp, No Referring Physician: Velna Ochs, MD  Reason for Consultation: Establishing goals of care  HPI/Patient Profile: 88 y.o. male  with past medical history of  hypertension, hypothyroidism, 2 CVAs, and dementia admitted on 05/22/2022 with decreased function and severe anemia - hgb 4.4. 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 Patient has required multiple blood transfusions. PMT consulted to discuss [place].    Clinical Assessment and Goals of Care: I have reviewed medical records including EPIC notes, labs and imaging, and assessed the patient.  Patient unable to participate in any meaningful conversation. No family at bedside.   Discussed case with case manager and [facility] MSW. From these discussions and chart review it appears son has opted to transition to comfort measures. Per report, family has expressed some interest in hospice facility.   RN reports patient ate ~50% breakfast and 10% lunch.   Attempted to call son to discuss further goals of care and disposition options however he did not answer and was unable to leave voicemail.   Will continue attempts to discuss goals with son. May be reasonable to request hospice facility placement considering patient;s profoundly low hemoglobin upon admission and no further plans to intervene. Per discussion with [facility] MSW, they would support this.   ADDENDUM: Later called by son he reported he was at bedside.  We met at bedside and discussed situation.  Son confirms that he wants to transition to focus fully on patient's comfort and avoid any aggressive medical interventions.  He does ask if patient could take a "chemo pill".  We discussed that this is not exactly in line with focusing on comfort and would likely involve needing to  pursue further work-up to confirm diagnosis.  Son immediately tells me he does not want to move forward with that and and does want to focus fully on patient's comfort.  We discussed option of going back home with support of pace and a palliative focus versus hospice facility.  Son would like to try to get patient home for a little while as long as patient tolerates with potential plan to transition to hospice facility if he is no longer able to care for patient in the home.  We discussed CODE STATUS as well.  DNR was recommended and son agrees with this.   Primary Decision Maker NEXT OF KIN - son Tilman Mcclaren    SUMMARY OF RECOMMENDATIONS   - code status change to DNR - son would like to dc home with support of [facility] and palliative/comfort focus - open to transition to [facility] in future if he is unable to care for patient in the home     Primary Diagnoses: Present on Admission:  GIB (gastrointestinal bleeding)   I have reviewed the medical record, interviewed the patient and family, and examined the patient. The following aspects are pertinent.  Past Medical History:  Diagnosis Date   Dementia ([place])    High cholesterol    Hypertension    Hypothyroidism    Stroke [facility])    Social History   Socioeconomic History   Marital status: Widowed    Spouse name: Not on file   Number of children: Not on file   Years of education: Not on file   Highest education level: Not on file  Occupational History   Not on file  Tobacco Use   Smoking status: Never   Smokeless tobacco: Never  Substance and Sexual Activity   Alcohol use: No   Drug use: No   Sexual activity: Not on file  Other Topics Concern   Not on file  Social History Narrative   Not on file   Social Determinants of Health   Financial Resource Strain: Not on file  Food Insecurity: Not on file  Transportation Needs: Not on file  Physical Activity: Not on file  Stress: Not on file  Social Connections: Not on file    History reviewed. No pertinent family history. Scheduled Meds:  potassium chloride  40 mEq Oral BID   Continuous Infusions: PRN Meds:.acetaminophen **OR** acetaminophen, ondansetron **OR** ondansetron (ZOFRAN) IV No Known Allergies Review of Systems  Unable to perform ROS: Dementia    Physical Exam Constitutional:      General: He is not in acute distress.    Appearance: He is ill-appearing.     Comments: frail  Pulmonary:     Effort: Pulmonary effort is normal.  Skin:    General: Skin is warm and dry.  Neurological:     Mental Status: He is alert. He is disoriented.     Vital Signs: BP (!) 133/90 (BP Location: Right Arm)   Pulse 89   Temp 98.4 F (36.9 C) (Oral)   Resp 18   Ht $R'5\' 10"'Zr$  (1.778 m)   Wt 50.9 kg   SpO2 96%   BMI 16.10 kg/m  Pain Scale: 0-10   Pain Score: 0-No pain   SpO2: SpO2: 96 % O2 Device:SpO2: 96 % O2 Flow Rate: .   IO: Intake/output summary:  Intake/Output Summary (Last 24 hours) at 05/25/2022 1603 Last data filed at 05/25/2022 0753 Gross per 24 hour  Intake 1873.8 ml  Output 100 ml  Net 1773.8 ml    LBM: Last BM Date : 05/25/22 Baseline Weight: Weight: 59 kg Most recent weight: Weight: 50.9 kg     Palliative Assessment/Data: PPS 30%     *Please note that this is a verbal dictation therefore any spelling or grammatical errors are due to the "[place] [employer]" system interpretation.   Juel Burrow, DNP, AGNP-C Palliative Medicine Team [phone] Pager: [phone]

## 2022-05-25 NOTE — TOC Progression Note (Signed)
Transition of Care [facility]) - Progression Note    Patient Details  Name: MANSON LUCKADOO MRN: [id number] Date of Birth: 10-30-1929  Transition of Care [facility]) CM/SW [place], RN Phone Number: 05/25/2022, 4:03 PM  Clinical Narrative:    Marcie Bal Panel, MSW with [facility] called and states that [facility] is able to provide palliative care services in the home but not hospice care.  The primary RN through [facility] spoke with the patient's son this morning and RN states that the patient's son would like to speak with someone regarding palliative care / hospice.  Palliative care consult was placed by Medicine MD team.    I spoke with Elicia Lamp, NP with Palliative care team and she will follow up with the patient's son.  The primary RN at [place] states that the son mentioned [employer] on the phone.  Palliative care to follow up with the family.   Expected Discharge Plan: [place] Barriers to Discharge: Continued Medical Work up  Expected Discharge Plan and Services Expected Discharge Plan: [place]   Discharge Planning Services: CM Consult Post Acute Care Choice: Resumption of Svcs/PTA Provider Living arrangements for the past 2 months: [place]:  ([facility] - [phone])         Social Determinants of Health (SDOH) Interventions    Readmission Risk Interventions    05/24/2022   11:49 AM  Readmission Risk Prevention Plan  Post Dischage Appt Complete  Medication Screening Complete  Transportation Screening Complete

## 2022-05-25 NOTE — Progress Notes (Signed)
Subjective:   Summary: Patrick Rhodes is a 88 y.o. year old male currently admitted on the IMTS HD#3 for severe microcytic anemia.  Overnight Events: No overnight events  Patient was seen this morning bedside, he was able to recognize myself and members of the team.  He was more responsive to commands, speech has improved.  Objective:  Vital signs in last 24 hours: Vitals:   05/24/22 1945 05/25/22 0457 05/25/22 0600 05/25/22 0739  BP: (!) 143/91 (!) 150/101 (!) 147/92 (!) 133/90  Pulse: 90 94  89  Resp:    18  Temp: 97.6 F (36.4 C) 98.3 F (36.8 C)  98.4 F (36.9 C)  TempSrc: Oral Oral  Oral  SpO2: 100%   96%  Weight:      Height:       Supplemental O2: Room Air SpO2: 96 %   Physical Exam:  Constitutional: Thin, ill-appearing male, non-coherent speech, in no acute distress  Cardiovascular: RRR, no murmurs, rubs or gallops Pulmonary/Chest: normal work of breathing on room air, lungs clear to auscultation bilaterally Abdominal: soft, non-tender, non-distended, small bump below the left rib cage Skin: warm and dry Extremities: upper/lower extremity pulses 2+, no lower extremity edema present  Filed Weights   05/22/22 1022 05/23/22 1450  Weight: 59 kg 50.9 kg     Intake/Output Summary (Last 24 hours) at 05/25/2022 1256 Last data filed at 05/25/2022 0753 Gross per 24 hour  Intake 1873.8 ml  Output 100 ml  Net 1773.8 ml   Net IO Since Admission: 5,588.93 mL [05/25/22 1256]  Pertinent Labs:    Latest Ref Rng & Units 05/25/2022    5:45 AM 05/24/2022    4:57 AM 05/23/2022    4:59 AM  CBC  WBC 4.0 - 10.5 K/uL 16.9  23.1  9.3   Hemoglobin 13.0 - 17.0 g/dL 8.9  8.3  9.4   Hematocrit 39.0 - 52.0 % 28.8  25.2  29.8   Platelets 150 - 400 K/uL 265  276  309        Latest Ref Rng & Units 05/25/2022    5:45 AM 05/24/2022    4:57 AM 05/23/2022    9:00 AM  CMP  Glucose 70 - 99 mg/dL 93  67    BUN 8 - 23 mg/dL 15  22    Creatinine 0.61 - 1.24 mg/dL  0.90  1.03    Sodium 135 - 145 mmol/L 144  144    Potassium 3.5 - 5.1 mmol/L 4.0  4.5    Chloride 98 - 111 mmol/L 112  115    CO2 22 - 32 mmol/L 21  23    Calcium 8.9 - 10.3 mg/dL 8.8  8.3    Total Protein 6.5 - 8.1 g/dL 5.5  5.5  6.3   Total Bilirubin 0.3 - 1.2 mg/dL 3.5  6.7  6.1   Alkaline Phos 38 - 126 U/L 259  254  323   AST 15 - 41 U/L 207  160  261   ALT 0 - 44 U/L 91  73  81      Assessment/Plan:   Principal Problem:   GIB (gastrointestinal bleeding) Active Problems:   AKI (acute kidney injury) ([place])   Metastatic malignant neoplasm (HCC)   Symptomatic anemia   Patient Summary: Patrick Rhodes is a 88 y.o. with a pertinent PMH of hypertension, hypothyroidism, 2 CVAs, who  presented with decreased functional ability and admitted for severe microcytic anemia.    #Severe microcytic anemia, suspected GI malignancy that has metastasized to liver and lungs Hemoglobin has actually increased from 8.3 yesterday to 8.9 today.  On exam today, patient was not in any acute distress.  CT of abdomen results have revealed a thickening of the soft tissue involving the proximal greater curvature of the stomach which is concerning for gastric malignancy, enhancing nodular right perihilar soft tissue which is concerning for soft tissue metastatic disease, numerous irregular low-attenuation liver lesions also concerning for metastatic disease, and solid nodule of the right lower lobe concerning for pulmonary metastatic disease.  Continues to have elevated white blood cell count, however has come down from yesterday to 16.9. AST, ALT, and ALP remain elevated 207, 91, and 259 respectively, continued elevation.   Plan: - Spoke with patient's son today, he would like to make the transition to comfort care/palliative care.  I have consulted palliative care to aid in assisting in helping finding what the best next steps are.  EGD for tomorrow has been canceled.    #AKI, prerenal  (Resolved)  #HTN Pt's blood pressure this AM was 150/101, will continue to monitor.  Code: Full    Dispo: Anticipated discharge in less than 2 midnights Drucie Opitz, MD PGY-1 Internal Medicine Resident Pager Number [phone] Please contact the on call pager after 5 pm and on weekends at [phone].

## 2022-05-26 DIAGNOSIS — Z515 Encounter for palliative care: Secondary | ICD-10-CM

## 2022-05-26 DIAGNOSIS — D500 Iron deficiency anemia secondary to blood loss (chronic): Secondary | ICD-10-CM | POA: Diagnosis not present

## 2022-05-26 DIAGNOSIS — C269 Malignant neoplasm of ill-defined sites within the digestive system: Secondary | ICD-10-CM | POA: Diagnosis not present

## 2022-05-26 DIAGNOSIS — C787 Secondary malignant neoplasm of liver and intrahepatic bile duct: Secondary | ICD-10-CM

## 2022-05-26 DIAGNOSIS — K922 Gastrointestinal hemorrhage, unspecified: Secondary | ICD-10-CM | POA: Diagnosis not present

## 2022-05-26 DIAGNOSIS — C7801 Secondary malignant neoplasm of right lung: Secondary | ICD-10-CM

## 2022-05-26 LAB — CBC
HCT: 28.5 % — ABNORMAL LOW (ref 39.0–52.0)
Hemoglobin: 8.7 g/dL — ABNORMAL LOW (ref 13.0–17.0)
MCH: 23.2 pg — ABNORMAL LOW (ref 26.0–34.0)
MCHC: 30.5 g/dL (ref 30.0–36.0)
MCV: 76 fL — ABNORMAL LOW (ref 80.0–100.0)
Platelets: 246 10*3/uL (ref 150–400)
RBC: 3.75 MIL/uL — ABNORMAL LOW (ref 4.22–5.81)
RDW: 21.2 % — ABNORMAL HIGH (ref 11.5–15.5)
WBC: 12.4 10*3/uL — ABNORMAL HIGH (ref 4.0–10.5)
nRBC: 0.3 % — ABNORMAL HIGH (ref 0.0–0.2)

## 2022-05-26 LAB — COMPREHENSIVE METABOLIC PANEL
ALT: 85 U/L — ABNORMAL HIGH (ref 0–44)
AST: 157 U/L — ABNORMAL HIGH (ref 15–41)
Albumin: 1.8 g/dL — ABNORMAL LOW (ref 3.5–5.0)
Alkaline Phosphatase: 246 U/L — ABNORMAL HIGH (ref 38–126)
Anion gap: 6 (ref 5–15)
BUN: 12 mg/dL (ref 8–23)
CO2: 21 mmol/L — ABNORMAL LOW (ref 22–32)
Calcium: 8.3 mg/dL — ABNORMAL LOW (ref 8.9–10.3)
Chloride: 113 mmol/L — ABNORMAL HIGH (ref 98–111)
Creatinine, Ser: 0.86 mg/dL (ref 0.61–1.24)
GFR, Estimated: >60 mL/min (ref >60)
Glucose, Bld: 71 mg/dL (ref 70–99)
Potassium: 4.2 mmol/L (ref 3.5–5.1)
Sodium: 140 mmol/L (ref 135–145)
Total Bilirubin: 3 mg/dL — ABNORMAL HIGH (ref 0.3–1.2)
Total Protein: 5.2 g/dL — ABNORMAL LOW (ref 6.5–8.1)

## 2022-05-26 LAB — GLUCOSE, CAPILLARY
Glucose-Capillary: 70 mg/dL (ref 70–99)
Glucose-Capillary: 70 mg/dL (ref 70–99)
Glucose-Capillary: 72 mg/dL (ref 70–99)
Glucose-Capillary: 77 mg/dL (ref 70–99)
Glucose-Capillary: 79 mg/dL (ref 70–99)
Glucose-Capillary: 80 mg/dL (ref 70–99)

## 2022-05-26 SURGERY — EGD (ESOPHAGOGASTRODUODENOSCOPY)
Anesthesia: Monitor Anesthesia Care

## 2022-05-26 NOTE — Discharge Summary (Addendum)
Name: Patrick Rhodes MRN: [id number] DOB: 1930-04-06 88 y.o. PCP: Pcp, No  Date of Admission: 05/22/2022 10:16 AM Date of Discharge: No discharge date for patient encounter. Attending Physician: Velna Ochs, MD  Discharge Diagnosis: 1. Principal Problem:   GIB (gastrointestinal bleeding) Active Problems:   AKI (acute kidney injury) ([place])   Metastatic malignant neoplasm (HCC)   Symptomatic anemia   Discharge Medications: Allergies as of 05/26/2022   No Known Allergies      Medication List     STOP taking these medications    aspirin EC 81 MG tablet   clopidogrel 75 MG tablet Commonly known as: PLAVIX   levothyroxine 100 MCG tablet Commonly known as: SYNTHROID   losartan 100 MG tablet Commonly known as: COZAAR       TAKE these medications    acetaminophen 500 MG tablet Commonly known as: TYLENOL Take 2 tablets (1,000 mg total) by mouth every 6 (six) hours. What changed:  when to take this reasons to take this   Centrum Adults Tabs Take 1 tablet by mouth daily.   hydroxypropyl methylcellulose / hypromellose 2.5 % ophthalmic solution Commonly known as: ISOPTO TEARS / GONIOVISC Place 1 drop into both eyes 3 (three) times daily as needed for dry eyes.   Vitamin D-3 25 MCG (1000 UT) Caps Take 1,000 Units by mouth daily with breakfast.        Disposition and follow-up:   Patrick Rhodes was discharged from [facility] in Good condition.  At the hospital follow up visit please address:  1.  Comfort care requirements  2.  Labs / imaging needed at time of follow-up: N/A  3.  Pending labs/ test needing follow-up: N/A   Follow-up Information     Dasanayaka, Edgar Frisk, MD. Schedule an appointment as soon as possible for a visit.   Specialty: Internal Medicine Contact information: [address] [address] [place] Hospital Course by problem list:   #Severe Microcytic Anemia,  suspected GI Malignancy with metastasis to Liver and Lungs  Patient initially presented with son, and initial history was taken from son.  Son stated that the patient is usually very independent, however has noticed for the past couple weeks he has not been able to ambulate more, and has been less responsive to questions with decreased appetite.  CBC was then obtained, which showed a hemoglobin value 4.4.  3 units of blood was then given in the emergency department, and on repeat CBC value was 9.4.  Source for what could be a potential GI bleed was underway.  Renal ultrasound was done to assess kidney function and showed lesions on the liver, GI was consulted GI was consulted, and recommended a CT of the abdomen.  CT abdomen revealed an area of focal soft tissue thickening involving the proximal greater curvature of the stomach that was concerning for gastric malignancy, enhancing nodular right periareolar soft tissue concerning for male breast cancer or soft tissue metastatic disease, numerous low-attenuation liver lesions concerning for metastatic disease, solid nodule of the right lower lobe concerning for pulmonary metastatic disease.  Initially GI recommended an EGD to obtain a biopsy and further characterize the masses, however son who is the primary caretaker did not want to undergo any further treatment or diagnosis work-up, so he opted to move towards palliative and comfort care.  He is currently being discharged home, and is  a pace patient who will help him find the resources that he needs for each step of his condition.   #Hypertension  Patient has a history of hypertension, and was only taking losartan for this.  Blood pressure has been stable throughout this patient's hospital course and losartan was held.  We will continue holding in the setting of comfort care.  Discharge Exam:   BP 131/76   Pulse 91   Temp 98.2 F (36.8 C) (Oral)   Resp 15   Ht '5\' 10"'$  (1.778 m)   Wt 50.9 kg   SpO2  100%   BMI 16.10 kg/m  Discharge exam:   Constitutional: Thin, ill-appearing male, slightly non-coherent speech, in no acute distress  Cardiovascular: RRR, no murmurs, rubs or gallops Pulmonary/Chest: normal work of breathing on room air, lungs clear to auscultation bilaterally Abdominal: soft, non-tender, non-distended, small bump below the left rib cage Skin: warm and dry Extremities: upper/lower extremity pulses 2+, no lower extremity edema present  Pertinent Labs, Studies, and Procedures:        Latest Ref Rng & Units 05/26/2022    2:31 AM 05/25/2022    5:45 AM 05/24/2022    4:57 AM  CBC  WBC 4.0 - 10.5 K/uL 12.4  16.9  23.1   Hemoglobin 13.0 - 17.0 g/dL 8.7  8.9  8.3   Hematocrit 39.0 - 52.0 % 28.5  28.8  25.2   Platelets 150 - 400 K/uL 246  265  276         Latest Ref Rng & Units 05/26/2022    2:31 AM 05/25/2022    5:45 AM 05/24/2022    4:57 AM  BMP  Glucose 70 - 99 mg/dL 71  93  67   BUN 8 - 23 mg/dL '12  15  22   '$ Creatinine 0.61 - 1.24 mg/dL 0.86  0.90  1.03   Sodium 135 - 145 mmol/L 140  144  144   Potassium 3.5 - 5.1 mmol/L 4.2  4.0  4.5   Chloride 98 - 111 mmol/L 113  112  115   CO2 22 - 32 mmol/L '21  21  23   '$ Calcium 8.9 - 10.3 mg/dL 8.3  8.8  8.3      CT ABDOMEN PELVIS W CONTRAST  Result Date: 05/23/2022 CLINICAL DATA:  Occult malignancy; * Tracking Code: BO * EXAM: CT ABDOMEN AND PELVIS WITH CONTRAST TECHNIQUE: Multidetector CT imaging of the abdomen and pelvis was performed using the standard protocol following bolus administration of intravenous contrast. RADIATION DOSE REDUCTION: This exam was performed according to the departmental dose-optimization program which includes automated exposure control, adjustment of the mA and/or kV according to patient size and/or use of iterative reconstruction technique. CONTRAST:  89m OMNIPAQUE IOHEXOL 350 MG/ML SOLN COMPARISON:  Renal ultrasound dated May 23, 2022 FINDINGS: Lower chest: Solid nodule of the right lower  lobe measuring 1.9 x 1.2 cm on series 5, image 10. Cardiomegaly. Hepatobiliary: Numerous irregular low-attenuation liver lesions. Largest is located in the right hepatic lobe and measures 7.0 x 6.5 cm. Additional reference lesion of the left lobe of the liver measuring 4.5 x 2.6 cm. Hydropic gallbladder with no evidence of wall thickening. No biliary ductal dilation. Pancreas: Unremarkable. No pancreatic ductal dilatation or surrounding inflammatory changes. Spleen: Normal in size without focal abnormality. Adrenals/Urinary Tract: Bilateral adrenal glands are unremarkable. No hydronephrosis or nephrolithiasis. Scattered bilateral low-attenuation renal lesions, largest are compatible with simple cysts, others are too small to completely characterize no  specific follow-up imaging is recommended for these lesions. Stomach/Bowel: Large stool ball seen in the rectum, no surrounding rectal wall thickening or inflammatory change. Area of soft tissue thickening involving the proximal greater curvature of the stomach measuring 1.9 cm. Vascular/Lymphatic: Aortic atherosclerosis. Mildly enlarged gastrohepatic ligament lymph node measuring 1.1 cm on series 3, image 23. Reproductive: Prostatomegaly, measuring up to 5.9 cm. Other: Enhancing nodular right periareolar soft tissue measuring 1.8 x 0.9 cm on series 3 image 5. Trace perihepatic ascites. Musculoskeletal: Intramedullary rod of the proximal right femur. Large well corticated osteophyte of the left iliac crest. No aggressive appearing osseous lesions. IMPRESSION: 1. Area of focal soft tissue thickening involving the proximal greater curvature of the stomach, concerning for gastric malignancy. Recommend endoscopy for further evaluation. 2. Enhancing nodular right periareolar soft tissue, concerning for male breast cancer versus soft tissue metastatic disease. Recommend correlation with mammography. 3. Numerous irregular low-attenuation liver lesions which were  solid-appearing on prior ultrasound, highly concerning for metastatic disease. 4. Solid nodule of the right lower lobe, highly concerning for pulmonary metastatic disease. 5. Mildly enlarged gastrohepatic ligament lymph node, concerning for nodal metastatic disease. 6. Trace perihepatic ascites. 7. Hydropic gallbladder with no gallbladder wall thickening. If there are symptoms of right upper quadrant pain, could be further evaluated with gallbladder ultrasound. 8. Large stool ball in the rectum, compatible with constipation. 9. Aortic Atherosclerosis (ICD10-I70.0). Electronically Signed   By: Yetta Glassman M.D.   On: 05/23/2022 14:43   US RENAL  Result Date: 05/23/2022 CLINICAL DATA:  AKI. EXAM: RENAL / URINARY TRACT ULTRASOUND COMPLETE COMPARISON:  None Available. FINDINGS: Right Kidney: Renal measurements: 9.1 x 4.9 x 3.4 cm = volume: 77 mL. Diffuse cortical thinning with increased echogenicity. No mass or hydronephrosis visualized. Left Kidney: Renal measurements: 9.5 x 5.4 x 3.5 cm = volume: 150 mL. Mild diffuse cortical thinning and increased echogenicity. No hydronephrosis visualized. Two exophytic cortical cysts, both measuring 1.2 cm, are noted at the middle third of the kidney. Bladder: Appears normal for degree of bladder distention. Other: Multiple hyperechoic lesions are noted throughout the liver, the largest measuring 8.1 cm in the posterior right liver. Two adjacent hyperechoic masses in the left liver measure 3.6 cm and 2.8 cm. IMPRESSION: 1. Atrophic and echogenic kidneys, right-greater-than-left, consistent with chronic medical renal disease. 2. Benign left renal cysts.  No follow-up imaging indicated. 3. Multiple hyperechoic lesions are noted throughout the liver, measuring up to 8.1 cm. When the patient is clinically stable and able to follow directions and hold their breath (preferably as an outpatient) further evaluation with dedicated abdominal MRI should be considered. Electronically  Signed   By: Ileana Roup M.D.   On: 05/23/2022 09:48   CT HEAD WO CONTRAST  Result Date: 05/22/2022 CLINICAL DATA:  Delirium.  Altered mental status. EXAM: CT HEAD WITHOUT CONTRAST TECHNIQUE: Contiguous axial images were obtained from the base of the skull through the vertex without intravenous contrast. RADIATION DOSE REDUCTION: This exam was performed according to the departmental dose-optimization program which includes automated exposure control, adjustment of the mA and/or kV according to patient size and/or use of iterative reconstruction technique. COMPARISON:  04/12/2020. FINDINGS: Brain: No evidence of acute infarction, hemorrhage, hydrocephalus, extra-axial collection or mass effect. No parenchymal masses. Isoattenuating extra-axial mass overlies the superior left frontal lobe, 2.4 x 1.1 x 2 cm, consistent with a meningioma, mildly enlarged compared to the CT and brain MRI dated 09/2018 where it measured 1.9 x 1.1 x 1.8 cm. Cavum septum pellucidum  and vergae, incidental developmental anomaly, stable. Multiple old basal ganglia and deep white matter lacunar infarcts. Chronic microvascular ischemic change. Old left cerebellar infarct. Vascular: No hyperdense vessel or unexpected calcification. Skull: Normal. Negative for fracture or focal lesion. Sinuses/Orbits: Globes and orbits are unremarkable. Visualized sinuses are clear. Other: None. IMPRESSION: 1. No acute intracranial abnormalities. No change the most recent prior head CT. Electronically Signed   By: Lajean Manes M.D.   On: 05/22/2022 11:40   DG Chest Port 1 View  Result Date: 05/22/2022 CLINICAL DATA:  Altered mental status EXAM: PORTABLE CHEST 1 VIEW COMPARISON:  04/11/2020 FINDINGS: Chronic cardiomegaly and aortic tortuosity/elongation. Artifact from EKG leads. There is no edema, consolidation, effusion, or pneumothorax. No acute osseous finding. IMPRESSION: Stable exam.  No acute finding. Electronically Signed   By: Jorje Guild M.D.    On: 05/22/2022 11:16     Discharge Instructions: Discharge Instructions     Call MD for:  difficulty breathing, headache or visual disturbances   Complete by: As directed    Call MD for:  persistant nausea and vomiting   Complete by: As directed    Diet - low sodium heart healthy   Complete by: As directed    Increase activity slowly   Complete by: As directed        Signed: Drucie Opitz, MD 05/26/2022, 1:13 PM   Pager: [phone]

## 2022-05-26 NOTE — TOC Progression Note (Addendum)
Transition of Care [facility]) - Progression Note    Patient Details  Name: Patrick Rhodes MRN: [id number] Date of Birth: August 18, 1930  Transition of Care [facility]) CM/SW [place], RN Phone Number: 05/26/2022, 11:53 AM  Clinical Narrative:    CM spoke with Palliative Care this morning and the patient will be discharged home with [facility] once Medically stable.  [facility] will continue to provide nursing aide care in the home along with care by the patient's son.  The son verbalized that when he is unable to provide care for his father in the future - he will ask [facility] to reach out to [facility] for hospice care support - likely [employer] at a later date.  05/26/2022 1155 - The patient is medically stable to be able to return home today.  The patient's son will be at home to receive patient from [facility] transport services today when he is discharged.  Medical team is placing discharge instructions to discharge to home after 2 pm.  I called Lavonda Jumbo, MSW with [facility] and she will arrange [facility] transport for the patient after 2 pm today.  05/26/2022 1315 - Patrick Rhodes, MSW with [facility] called back and states that [facility] MD would prefer the patient be discharged home by [facility] transport.  I called the patient's son on the phone and he plans to pick the patient's wheelchair up from the hospital room around 1:30 pm today since [facility] is unable to carry equipment.  [facility] was called and transport was set up to take the patient home after 3 pm today.  [facility] packet at secretary's desk including the DNR form.  [facility] is able to access Epic and will have a copy of the discharge summary for continued care in the home.  Bedside nursing - Please call the patient's son to discuss discharge instructions.   Expected Discharge Plan: [place] Barriers to Discharge: Continued Medical Work up  Expected Discharge Plan and Services Expected Discharge Plan: [place]   Discharge  Planning Services: CM Consult Post Acute Care Choice: Resumption of Svcs/PTA Provider Living arrangements for the past 2 months: Single Family Home Expected Discharge Date: 05/26/22                           [facility] Agency:  ([facility] - [phone])         Social Determinants of Health (SDOH) Interventions    Readmission Risk Interventions    05/24/2022   11:49 AM  Readmission Risk Prevention Plan  Post Dischage Appt Complete  Medication Screening Complete  Transportation Screening Complete

## 2022-05-26 NOTE — Discharge Instructions (Addendum)
It was a pleasure being a part of your care! You came into the hospital because your blood count was low, and while we were looking for the source we found a mass in your stomach, liver, and lung. [facility] will help set up any support you might need. Take care.

## 2022-05-26 NOTE — Progress Notes (Signed)
Pt ivs dc, informed of POC, [facility] on site to discharge pt home, family aware

## 2022-05-29 LAB — GLUCOSE, CAPILLARY: Glucose-Capillary: 79 mg/dL (ref 70–99)

## 2022-06-07 ENCOUNTER — Other Ambulatory Visit: Payer: Self-pay

## 2022-06-07 ENCOUNTER — Encounter (HOSPITAL_COMMUNITY): Payer: Self-pay | Admitting: Emergency Medicine

## 2022-06-07 ENCOUNTER — Emergency Department (HOSPITAL_COMMUNITY): Payer: Medicare (Managed Care)

## 2022-06-07 ENCOUNTER — Inpatient Hospital Stay (HOSPITAL_COMMUNITY)
Admission: EM | Admit: 2022-06-07 | Discharge: 2022-06-22 | DRG: 071 | Disposition: E | Payer: Medicare (Managed Care) | Attending: Internal Medicine | Admitting: Internal Medicine

## 2022-06-07 DIAGNOSIS — D509 Iron deficiency anemia, unspecified: Secondary | ICD-10-CM | POA: Diagnosis present

## 2022-06-07 DIAGNOSIS — Z7401 Bed confinement status: Secondary | ICD-10-CM

## 2022-06-07 DIAGNOSIS — R410 Disorientation, unspecified: Secondary | ICD-10-CM

## 2022-06-07 DIAGNOSIS — E860 Dehydration: Secondary | ICD-10-CM | POA: Diagnosis present

## 2022-06-07 DIAGNOSIS — C787 Secondary malignant neoplasm of liver and intrahepatic bile duct: Secondary | ICD-10-CM | POA: Diagnosis present

## 2022-06-07 DIAGNOSIS — G9341 Metabolic encephalopathy: Principal | ICD-10-CM | POA: Diagnosis present

## 2022-06-07 DIAGNOSIS — R627 Adult failure to thrive: Secondary | ICD-10-CM | POA: Diagnosis present

## 2022-06-07 DIAGNOSIS — R0682 Tachypnea, not elsewhere classified: Secondary | ICD-10-CM | POA: Diagnosis present

## 2022-06-07 DIAGNOSIS — F0390 Unspecified dementia without behavioral disturbance: Secondary | ICD-10-CM | POA: Diagnosis present

## 2022-06-07 DIAGNOSIS — E7800 Pure hypercholesterolemia, unspecified: Secondary | ICD-10-CM | POA: Diagnosis present

## 2022-06-07 DIAGNOSIS — D649 Anemia, unspecified: Secondary | ICD-10-CM

## 2022-06-07 DIAGNOSIS — Z8673 Personal history of transient ischemic attack (TIA), and cerebral infarction without residual deficits: Secondary | ICD-10-CM

## 2022-06-07 DIAGNOSIS — C7800 Secondary malignant neoplasm of unspecified lung: Secondary | ICD-10-CM | POA: Diagnosis present

## 2022-06-07 DIAGNOSIS — C269 Malignant neoplasm of ill-defined sites within the digestive system: Secondary | ICD-10-CM | POA: Diagnosis present

## 2022-06-07 DIAGNOSIS — Z79899 Other long term (current) drug therapy: Secondary | ICD-10-CM

## 2022-06-07 DIAGNOSIS — R32 Unspecified urinary incontinence: Secondary | ICD-10-CM | POA: Diagnosis present

## 2022-06-07 DIAGNOSIS — N179 Acute kidney failure, unspecified: Secondary | ICD-10-CM | POA: Diagnosis present

## 2022-06-07 DIAGNOSIS — K922 Gastrointestinal hemorrhage, unspecified: Secondary | ICD-10-CM | POA: Diagnosis present

## 2022-06-07 DIAGNOSIS — I10 Essential (primary) hypertension: Secondary | ICD-10-CM | POA: Diagnosis present

## 2022-06-07 DIAGNOSIS — E039 Hypothyroidism, unspecified: Secondary | ICD-10-CM | POA: Diagnosis present

## 2022-06-07 DIAGNOSIS — Z66 Do not resuscitate: Secondary | ICD-10-CM | POA: Diagnosis present

## 2022-06-07 DIAGNOSIS — Z515 Encounter for palliative care: Secondary | ICD-10-CM

## 2022-06-07 DIAGNOSIS — E162 Hypoglycemia, unspecified: Secondary | ICD-10-CM

## 2022-06-07 LAB — CBC WITH DIFFERENTIAL/PLATELET
Abs Immature Granulocytes: 0.18 10*3/uL — ABNORMAL HIGH (ref 0.00–0.07)
Basophils Absolute: 0 10*3/uL (ref 0.0–0.1)
Basophils Relative: 0 %
Eosinophils Absolute: 0 10*3/uL (ref 0.0–0.5)
Eosinophils Relative: 0 %
HCT: 17.2 % — ABNORMAL LOW (ref 39.0–52.0)
Hemoglobin: 5 g/dL — CL (ref 13.0–17.0)
Immature Granulocytes: 1 %
Lymphocytes Relative: 6 %
Lymphs Abs: 0.8 10*3/uL (ref 0.7–4.0)
MCH: 23.4 pg — ABNORMAL LOW (ref 26.0–34.0)
MCHC: 29.1 g/dL — ABNORMAL LOW (ref 30.0–36.0)
MCV: 80.4 fL (ref 80.0–100.0)
Monocytes Absolute: 0.5 10*3/uL (ref 0.1–1.0)
Monocytes Relative: 4 %
Neutro Abs: 11.6 10*3/uL — ABNORMAL HIGH (ref 1.7–7.7)
Neutrophils Relative %: 89 %
Platelets: 403 10*3/uL — ABNORMAL HIGH (ref 150–400)
RBC: 2.14 MIL/uL — ABNORMAL LOW (ref 4.22–5.81)
RDW: 23.8 % — ABNORMAL HIGH (ref 11.5–15.5)
WBC: 13.1 10*3/uL — ABNORMAL HIGH (ref 4.0–10.5)
nRBC: 1.1 % — ABNORMAL HIGH (ref 0.0–0.2)

## 2022-06-07 LAB — COMPREHENSIVE METABOLIC PANEL
ALT: 49 U/L — ABNORMAL HIGH (ref 0–44)
AST: 194 U/L — ABNORMAL HIGH (ref 15–41)
Albumin: 1.5 g/dL — ABNORMAL LOW (ref 3.5–5.0)
Alkaline Phosphatase: 131 U/L — ABNORMAL HIGH (ref 38–126)
Anion gap: 12 (ref 5–15)
BUN: 68 mg/dL — ABNORMAL HIGH (ref 8–23)
CO2: 23 mmol/L (ref 22–32)
Calcium: 8.9 mg/dL (ref 8.9–10.3)
Chloride: 110 mmol/L (ref 98–111)
Creatinine, Ser: 2.46 mg/dL — ABNORMAL HIGH (ref 0.61–1.24)
GFR, Estimated: 24 mL/min — ABNORMAL LOW (ref >60)
Glucose, Bld: 126 mg/dL — ABNORMAL HIGH (ref 70–99)
Potassium: 3.8 mmol/L (ref 3.5–5.1)
Sodium: 145 mmol/L (ref 135–145)
Total Bilirubin: 3.2 mg/dL — ABNORMAL HIGH (ref 0.3–1.2)
Total Protein: 5.6 g/dL — ABNORMAL LOW (ref 6.5–8.1)

## 2022-06-07 LAB — CBG MONITORING, ED
Glucose-Capillary: 18 mg/dL — CL (ref 70–99)
Glucose-Capillary: 24 mg/dL — CL (ref 70–99)
Glucose-Capillary: <10 mg/dL — CL (ref 70–99)

## 2022-06-07 LAB — POC OCCULT BLOOD, ED: Fecal Occult Bld: POSITIVE — AB

## 2022-06-07 LAB — PROTIME-INR
INR: 1.3 — ABNORMAL HIGH (ref 0.8–1.2)
Prothrombin Time: 16.3 seconds — ABNORMAL HIGH (ref 11.4–15.2)

## 2022-06-07 LAB — LIPASE, BLOOD: Lipase: 22 U/L (ref 11–51)

## 2022-06-07 LAB — APTT: aPTT: 22 seconds — ABNORMAL LOW (ref 24–36)

## 2022-06-07 MED ORDER — DEXTROSE-NACL 5-0.9 % IV SOLN: Freq: Once | INTRAVENOUS | Status: AC

## 2022-06-07 MED ORDER — DEXTROSE 50 % IV SOLN
INTRAVENOUS | Status: AC
  Filled 2022-06-07: qty 50

## 2022-06-07 MED ORDER — DEXTROSE 50 % IV SOLN
1.0000 | Freq: Once | INTRAVENOUS | Status: AC
  Administered 2022-06-07: 50 mL via INTRAVENOUS
  Filled 2022-06-07: qty 50

## 2022-06-07 NOTE — ED Triage Notes (Signed)
Pt BIB [facility] from home, family reports pt stopped eating around Saturday and today he's been weak and altered. Per EMS, pt normally talkative and able to get around. Hx dementia, initial CBG 50, given D10 drip, initial BP 89/64.

## 2022-06-07 NOTE — ED Provider Notes (Signed)
MSE performed by myself and Dr.Patterson Hx provided by EMS  Patient w/ hx of dementia and stroke. FTT no oral intake for the past few days.   Admission for GI bleed and dehydration on 10/1. Had a blood transfusion . Noted to have metastatic cancer. Pt DNR but son could not find ppwk Noted to be hypoglycemic to 50, altered, dry.   Physical Exam  BP (!) 106/59   Resp 17   Physical Exam Weak, dry membranes, cool extremities Procedures  Procedures Peripheral IV insertion by Dr. Philip Aspen ED Course / MDM    Medical Decision Making  Work up initiated       Margarita Mail, PA-C 06/14/2022 2250    Ripley Fraise, MD 05/31/2022 2336

## 2022-06-07 NOTE — ED Provider Notes (Signed)
[facility] EMERGENCY DEPARTMENT Provider Note   CSN: [id number] Arrival date & time: 06/19/2022  2224     History {Add pertinent medical, surgical, social history, OB history to HPI:1} Chief Complaint  Patient presents with   Altered Mental Status   Level 5 caveat due to altered mental status Patrick Rhodes is a 88 y.o. male.  The history is provided by a relative.  Patient history of hypertension, dementia, hyperlipidemia presents with altered mental status.  Entire history provided by the son Son reports for the past 3 days patient has had very little oral intake.  He has also had very little urine output. Patient was recently admitted and found to have likely GI malignancy, however comfort measures were requested by family. No recent trauma.    Past Medical History:  Diagnosis Date   Dementia ([place])    High cholesterol    Hypertension    Hypothyroidism    Stroke [facility])     Home Medications Prior to Admission medications   Medication Sig Start Date End Date Taking? Authorizing Provider  acetaminophen (TYLENOL) 500 MG tablet Take 2 tablets (1,000 mg total) by mouth every 6 (six) hours. Patient taking differently: Take 1,000 mg by mouth every 6 (six) hours as needed for mild pain. 04/17/20  Yes [name], MD  hydroxypropyl methylcellulose / hypromellose (ISOPTO TEARS / GONIOVISC) 2.5 % ophthalmic solution Place 1 drop into both eyes 3 (three) times daily as needed for dry eyes.   Yes [provider]  Multiple Vitamins-Minerals (CENTRUM ADULTS) TABS Take 1 tablet by mouth daily.   Yes [provider]  Cholecalciferol (VITAMIN D-3) 25 MCG (1000 UT) CAPS Take 1,000 Units by mouth daily with breakfast.    [provider]  LORazepam (ATIVAN) 2 MG/ML concentrated solution Take 2.5 mLs by mouth every 2 (two) hours as needed for sleep. 05/27/22   [provider]  Morphine Sulfate (MORPHINE CONCENTRATE) 10 mg / 0.5 ml  concentrated solution Take 0.25 mLs by mouth every 2 (two) hours as needed for severe pain. 05/27/22   [provider]      Allergies    Patient has no known allergies.    Review of Systems   Review of Systems  Unable to perform ROS: Mental status change    Physical Exam Updated Vital Signs BP (!) 106/59   Temp 98.5 F (36.9 C) (Rectal)   Resp 17  Physical Exam CONSTITUTIONAL: Elderly, frail, ill-appearing HEAD: Normocephalic/atraumatic ENMT: Mucous membranes dry NECK: supple no meningeal signs CV: S1/S2 noted LUNGS: Lungs are clear to auscultation bilaterally, no apparent distress ABDOMEN: soft, nontender GU-condom catheter in place.  Family present at bedside NEURO: Pt is awake/alert but is confused.  He is nonverbal. EXTREMITIES: Lower extremities are contracted and at baseline per family SKIN: warm, color normal PSYCH: Unable to assess  ED Results / Procedures / Treatments   Labs (all labs ordered are listed, but only abnormal results are displayed) Labs Reviewed  COMPREHENSIVE METABOLIC PANEL - Abnormal; Notable for the following components:      Result Value   Glucose, Bld 126 (*)    BUN 68 (*)    Creatinine, Ser 2.46 (*)    Total Protein 5.6 (*)    Albumin 1.5 (*)    AST 194 (*)    ALT 49 (*)    Alkaline Phosphatase 131 (*)    Total Bilirubin 3.2 (*)    GFR, Estimated 24 (*)    All  other components within normal limits  CBC WITH DIFFERENTIAL/PLATELET - Abnormal; Notable for the following components:   WBC 13.1 (*)    RBC 2.14 (*)    Hemoglobin 5.0 (*)    HCT 17.2 (*)    MCH 23.4 (*)    MCHC 29.1 (*)    RDW 23.8 (*)    Platelets 403 (*)    nRBC 1.1 (*)    All other components within normal limits  PROTIME-INR - Abnormal; Notable for the following components:   Prothrombin Time 16.3 (*)    INR 1.3 (*)    All other components within normal limits  APTT - Abnormal; Notable for the following components:   aPTT 22 (*)    All other components  within normal limits  CBG MONITORING, ED - Abnormal; Notable for the following components:   Glucose-Capillary <10 (*)    All other components within normal limits  POC OCCULT BLOOD, ED - Abnormal; Notable for the following components:   Fecal Occult Bld POSITIVE (*)    All other components within normal limits  CBG MONITORING, ED - Abnormal; Notable for the following components:   Glucose-Capillary 24 (*)    All other components within normal limits  CBG MONITORING, ED - Abnormal; Notable for the following components:   Glucose-Capillary 18 (*)    All other components within normal limits  CULTURE, BLOOD (ROUTINE X 2)  CULTURE, BLOOD (ROUTINE X 2)  LIPASE, BLOOD  LACTIC ACID, PLASMA  LACTIC ACID, PLASMA  URINALYSIS, ROUTINE W REFLEX MICROSCOPIC  I-STAT CHEM 8, ED  TYPE AND SCREEN    EKG EKG Interpretation  Date/Time:  Tuesday June 07 2022 23:19:23 EDT Ventricular Rate:  107 PR Interval:  160 QRS Duration: 94 QT Interval:  332 QTC Calculation: 443 R Axis:   78 Text Interpretation: Sinus tachycardia with Premature supraventricular complexes and Premature ventricular complexes or Fusion complexes Cannot rule out Anterior infarct , age undetermined ST & T wave abnormality, consider inferior ischemia Abnormal ECG Interpretation limited secondary to artifact Confirmed by Ripley Fraise ([id number]) on 06/17/2022 11:35:26 PM  Radiology DG Chest Port 1 View  Result Date: 06/12/2022 CLINICAL DATA:  Questionable sepsis; evaluate for abnormality EXAM: PORTABLE CHEST 1 VIEW COMPARISON:  Radiographs 05/22/2022 FINDINGS: Stable cardiomediastinal silhouette. Aortic atherosclerotic calcification. No focal consolidation, pleural effusion, or pneumothorax. No acute osseous abnormality. Presumed nipple shadow projecting over the right lower lung. IMPRESSION: No acute finding.  Stable cardiomegaly. Electronically Signed   By: Placido Sou M.D.   On: 06/19/2022 23:03    Procedures .Critical  Care  Performed by: Ripley Fraise, MD Authorized by: Ripley Fraise, MD   Critical care provider statement:    Critical care start time:  06/19/2022 11:41 PM   Critical care end time:  05/22/2022 11:41 PM   Critical care time was exclusive of:  Separately billable procedures and treating other patients   Critical care was necessary to treat or prevent imminent or life-threatening deterioration of the following conditions:  Sepsis, shock, renal failure and metabolic crisis   Critical care was time spent personally by me on the following activities:  Examination of patient, development of treatment plan with patient or surrogate, ordering and review of radiographic studies, ordering and review of laboratory studies, ordering and performing treatments and interventions, review of old charts and pulse oximetry   I assumed direction of critical care for this patient from another provider in my specialty: no     Care discussed with: admitting provider     {  Document cardiac monitor, telemetry assessment procedure when appropriate:1}  Medications Ordered in ED Medications  dextrose 50 % solution (  Given 05/29/2022 2254)  dextrose 50 % solution (  Given 05/26/2022 2310)  dextrose 5 %-0.9 % sodium chloride infusion ( Intravenous New Bag/Given 05/23/2022 2327)  dextrose 50 % solution 50 mL (50 mLs Intravenous Given 05/24/2022 2332)    ED Course/ Medical Decision Making/ A&P Clinical Course as of 06/04/2022 2337  Tue Jun 07, 2022  2334 Hemoglobin(!!): 5.0 Acute anemia [DW]    Clinical Course User Index [DW] Ripley Fraise, MD                           Medical Decision Making Amount and/or Complexity of Data Reviewed Labs:  Decision-making details documented in ED Course.  Risk Prescription drug management. Decision regarding hospitalization.   This patient presents to the ED for concern of weakness, this involves an extensive number of treatment options, and is a complaint that carries  with it a high risk of complications and morbidity.  The differential diagnosis includes but is not limited to CVA, intracranial hemorrhage, acute coronary syndrome, renal failure, urinary tract infection, electrolyte disturbance, pneumonia    Comorbidities that complicate the patient evaluation: Patient's presentation is complicated by their history of dementia and hypertension  Social Determinants of Health: Patient's  poor mobility   increases the complexity of managing their presentation  Additional history obtained: Additional history obtained from family Records reviewed previous admission documents  Lab Tests: I Ordered, and personally interpreted labs.  The pertinent results include: Hyperglycemia, renal failure, anemia  Imaging Studies ordered: I ordered imaging studies including X-ray chest   I independently visualized and interpreted imaging which showed no acute findings I agree with the radiologist interpretation  Cardiac Monitoring: The patient was maintained on a cardiac monitor.  I personally viewed and interpreted the cardiac monitor which showed an underlying rhythm of:  sinus rhythm  Medicines ordered and prescription drug management: I ordered medication including dextrose for hypoglycemia Reevaluation of the patient after these medicines showed that the patient    {resolved/improved/worsened:23923::"improved"}  Test Considered: Patient is low risk / negative by ***, therefore do not feel that *** is indicated.  Critical Interventions:  ***  Consultations Obtained: I requested consultation with the {consultation:26851}, and discussed  findings as well as pertinent plan - they recommend: ***  Reevaluation: After the interventions noted above, I reevaluated the patient and found that they have :{resolved/improved/worsened:23923::"improved"}  Complexity of problems addressed: Patient's presentation is most consistent with  acute presentation with potential  threat to life or bodily function  Disposition: After consideration of the diagnostic results and the patient's response to treatment,  I feel that the patent would benefit from admission   .    {Document critical care time when appropriate:1} {Document review of labs and clinical decision tools ie heart score, Chads2Vasc2 etc:1}  {Document your independent review of radiology images, and any outside records:1} {Document your discussion with family members, caretakers, and with consultants:1} {Document social determinants of health affecting pt's care:1} {Document your decision making why or why not admission, treatments were needed:1} Final Clinical Impression(s) / ED Diagnoses Final diagnoses:  Hypoglycemia    Rx / DC Orders ED Discharge Orders     None

## 2022-06-08 DIAGNOSIS — E162 Hypoglycemia, unspecified: Secondary | ICD-10-CM | POA: Diagnosis present

## 2022-06-08 DIAGNOSIS — G9341 Metabolic encephalopathy: Secondary | ICD-10-CM

## 2022-06-08 DIAGNOSIS — Z7401 Bed confinement status: Secondary | ICD-10-CM | POA: Diagnosis not present

## 2022-06-08 DIAGNOSIS — K922 Gastrointestinal hemorrhage, unspecified: Secondary | ICD-10-CM | POA: Diagnosis present

## 2022-06-08 DIAGNOSIS — Z515 Encounter for palliative care: Secondary | ICD-10-CM | POA: Diagnosis not present

## 2022-06-08 DIAGNOSIS — Z66 Do not resuscitate: Secondary | ICD-10-CM

## 2022-06-08 DIAGNOSIS — R627 Adult failure to thrive: Secondary | ICD-10-CM | POA: Diagnosis present

## 2022-06-08 DIAGNOSIS — E7800 Pure hypercholesterolemia, unspecified: Secondary | ICD-10-CM | POA: Diagnosis present

## 2022-06-08 DIAGNOSIS — N179 Acute kidney failure, unspecified: Secondary | ICD-10-CM | POA: Diagnosis present

## 2022-06-08 DIAGNOSIS — E039 Hypothyroidism, unspecified: Secondary | ICD-10-CM | POA: Diagnosis present

## 2022-06-08 DIAGNOSIS — R638 Other symptoms and signs concerning food and fluid intake: Secondary | ICD-10-CM

## 2022-06-08 DIAGNOSIS — C787 Secondary malignant neoplasm of liver and intrahepatic bile duct: Secondary | ICD-10-CM | POA: Diagnosis present

## 2022-06-08 DIAGNOSIS — Z79899 Other long term (current) drug therapy: Secondary | ICD-10-CM | POA: Diagnosis not present

## 2022-06-08 DIAGNOSIS — I10 Essential (primary) hypertension: Secondary | ICD-10-CM | POA: Diagnosis present

## 2022-06-08 DIAGNOSIS — Z8673 Personal history of transient ischemic attack (TIA), and cerebral infarction without residual deficits: Secondary | ICD-10-CM | POA: Diagnosis not present

## 2022-06-08 DIAGNOSIS — Z7189 Other specified counseling: Secondary | ICD-10-CM

## 2022-06-08 DIAGNOSIS — R32 Unspecified urinary incontinence: Secondary | ICD-10-CM | POA: Diagnosis present

## 2022-06-08 DIAGNOSIS — E860 Dehydration: Secondary | ICD-10-CM | POA: Diagnosis present

## 2022-06-08 DIAGNOSIS — R0682 Tachypnea, not elsewhere classified: Secondary | ICD-10-CM | POA: Diagnosis present

## 2022-06-08 DIAGNOSIS — D509 Iron deficiency anemia, unspecified: Secondary | ICD-10-CM | POA: Diagnosis present

## 2022-06-08 DIAGNOSIS — C269 Malignant neoplasm of ill-defined sites within the digestive system: Secondary | ICD-10-CM | POA: Diagnosis present

## 2022-06-08 DIAGNOSIS — F0390 Unspecified dementia without behavioral disturbance: Secondary | ICD-10-CM | POA: Diagnosis present

## 2022-06-08 DIAGNOSIS — C7800 Secondary malignant neoplasm of unspecified lung: Secondary | ICD-10-CM | POA: Diagnosis present

## 2022-06-08 LAB — URINALYSIS, ROUTINE W REFLEX MICROSCOPIC
Bilirubin Urine: NEGATIVE
Glucose, UA: 50 mg/dL — AB
Hgb urine dipstick: NEGATIVE
Ketones, ur: NEGATIVE mg/dL
Leukocytes,Ua: NEGATIVE
Nitrite: NEGATIVE
Protein, ur: NEGATIVE mg/dL
Specific Gravity, Urine: 1.016 (ref 1.005–1.030)
pH: 5 (ref 5.0–8.0)

## 2022-06-08 LAB — GLUCOSE, CAPILLARY: Glucose-Capillary: 114 mg/dL — ABNORMAL HIGH (ref 70–99)

## 2022-06-08 LAB — CBG MONITORING, ED
Glucose-Capillary: 111 mg/dL — ABNORMAL HIGH (ref 70–99)
Glucose-Capillary: 16 mg/dL — CL (ref 70–99)
Glucose-Capillary: 172 mg/dL — ABNORMAL HIGH (ref 70–99)
Glucose-Capillary: 190 mg/dL — ABNORMAL HIGH (ref 70–99)
Glucose-Capillary: 234 mg/dL — ABNORMAL HIGH (ref 70–99)
Glucose-Capillary: 262 mg/dL — ABNORMAL HIGH (ref 70–99)
Glucose-Capillary: 61 mg/dL — ABNORMAL LOW (ref 70–99)
Glucose-Capillary: 71 mg/dL (ref 70–99)

## 2022-06-08 LAB — PREPARE RBC (CROSSMATCH)

## 2022-06-08 MED ORDER — MORPHINE SULFATE (PF) 2 MG/ML IV SOLN
1.0000 mg | INTRAVENOUS | Status: DC | PRN
  Administered 2022-06-08: 2 mg via INTRAVENOUS
  Filled 2022-06-08: qty 1

## 2022-06-08 MED ORDER — DEXTROSE-NACL 5-0.9 % IV SOLN: INTRAVENOUS | Status: DC

## 2022-06-08 MED ORDER — SENNOSIDES-DOCUSATE SODIUM 8.6-50 MG PO TABS: 1.0000 | ORAL_TABLET | Freq: Every evening | ORAL | Status: DC | PRN

## 2022-06-08 MED ORDER — ONDANSETRON HCL 4 MG PO TABS: 4.0000 mg | ORAL_TABLET | Freq: Four times a day (QID) | ORAL | Status: DC | PRN

## 2022-06-08 MED ORDER — ACETAMINOPHEN 650 MG RE SUPP: 650.0000 mg | Freq: Four times a day (QID) | RECTAL | Status: DC | PRN

## 2022-06-08 MED ORDER — SODIUM CHLORIDE 0.9 % IV BOLUS (SEPSIS)
2000.0000 mL | Freq: Once | INTRAVENOUS | Status: AC
  Administered 2022-06-08: 2000 mL via INTRAVENOUS

## 2022-06-08 MED ORDER — LORAZEPAM 2 MG/ML IJ SOLN: 1.0000 mg | Freq: Four times a day (QID) | INTRAMUSCULAR | Status: DC | PRN

## 2022-06-08 MED ORDER — ENOXAPARIN SODIUM 30 MG/0.3ML IJ SOSY: 30.0000 mg | PREFILLED_SYRINGE | INTRAMUSCULAR | Status: DC

## 2022-06-08 MED ORDER — GLYCOPYRROLATE 1 MG PO TABS: 1.0000 mg | ORAL_TABLET | ORAL | Status: DC | PRN

## 2022-06-08 MED ORDER — ACETAMINOPHEN 325 MG PO TABS: 650.0000 mg | ORAL_TABLET | Freq: Four times a day (QID) | ORAL | Status: DC | PRN

## 2022-06-08 MED ORDER — POLYVINYL ALCOHOL 1.4 % OP SOLN: 1.0000 [drp] | Freq: Four times a day (QID) | OPHTHALMIC | Status: DC | PRN

## 2022-06-08 MED ORDER — MORPHINE SULFATE (PF) 2 MG/ML IV SOLN
1.0000 mg | INTRAVENOUS | Status: DC | PRN
  Administered 2022-06-08 (×2): 2 mg via INTRAVENOUS
  Filled 2022-06-08 (×2): qty 1

## 2022-06-08 MED ORDER — SODIUM CHLORIDE 0.9 % IV SOLN: INTRAVENOUS | Status: DC

## 2022-06-08 MED ORDER — SODIUM CHLORIDE 0.9 % IV SOLN
10.0000 mL/h | Freq: Once | INTRAVENOUS | Status: AC
  Administered 2022-06-08: 10 mL/h via INTRAVENOUS

## 2022-06-08 MED ORDER — DEXTROSE 50 % IV SOLN
INTRAVENOUS | Status: AC
  Administered 2022-06-08: 50 mL via INTRAVENOUS
  Filled 2022-06-08: qty 50

## 2022-06-08 MED ORDER — INSULIN ASPART 100 UNIT/ML IJ SOLN: 0.0000 [IU] | INTRAMUSCULAR | Status: DC

## 2022-06-08 MED ORDER — GLYCOPYRROLATE 0.2 MG/ML IJ SOLN: 0.2000 mg | INTRAMUSCULAR | Status: DC | PRN

## 2022-06-08 MED ORDER — ONDANSETRON HCL 4 MG/2ML IJ SOLN
4.0000 mg | Freq: Four times a day (QID) | INTRAMUSCULAR | Status: DC | PRN
  Administered 2022-06-08: 4 mg via INTRAVENOUS
  Filled 2022-06-08: qty 2

## 2022-06-08 MED ORDER — MORPHINE SULFATE (PF) 2 MG/ML IV SOLN: 2.0000 mg | INTRAVENOUS | Status: DC | PRN

## 2022-06-09 LAB — BLOOD CULTURE ID PANEL (REFLEXED) - BCID2

## 2022-06-09 LAB — TYPE AND SCREEN
ABO/RH(D): O POS
Antibody Screen: NEGATIVE
Unit division: 0
Unit division: 0

## 2022-06-09 LAB — BPAM RBC
Blood Product Expiration Date: 202311152359
Blood Product Expiration Date: 202311152359
ISSUE DATE / TIME: 202310180111
ISSUE DATE / TIME: 202310180521
Unit Type and Rh: 5100
Unit Type and Rh: 5100

## 2022-06-13 LAB — CULTURE, BLOOD (ROUTINE X 2): Special Requests: ADEQUATE

## 2022-06-22 NOTE — Progress Notes (Signed)
   06/23/2022 Nov 18, 2219  Attending [place]  Attending Physician Notified Y  Attending Physician (First and Last Name) Romana Juniper, MD  Post Mortem Checklist  Date of Death June 23, 2022  Time of Death 11-17-13  Pronounced By Percell Boston RN/Kimberly Gengler RN  Next of kin notified Yes  Name of next of kin notified of death Delcie Roch  Contact Person's Relationship to Patient Son  Contact Person's Phone Number [phone]  Contact Person's address [address], [place], [place] [address]  Family Communication Notes Son at bedside at time of death  Was the patient a No Code Blue or a Limited Code Blue? Yes  Did the patient die unattended? No  Patient restrained? Not applicable  Height '5\' 10"'$  (1.778 m)  Weight 50.3 kg  Autopsy  Autopsy requested by MD or Family ( Non ME Case) N/A  Medical Examiner  Is this a medical examiner's case? [facility] home name/address/phone # [place] [phone]  Planned location of pickup [place]

## 2022-06-22 NOTE — Progress Notes (Signed)
Unable to do admission screening at this point, pt. Is non-verbal and unable to answer questions, call placed to son [name] who states he will be in to see his dad before 20:30 and will answer questions at that point  Patrick Rhodes

## 2022-06-22 NOTE — H&P (Addendum)
Date: 06-29-2022               Patient Name:  Patrick Rhodes MRN: [id number]  DOB: 1930/04/01 Age / Sex: 88 y.o., male   PCP: Pcp, No         Medical Service: Internal Medicine Teaching Service         Attending Physician: : Dr. 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 At the time, no further diagnostic test was pursued as patient's son did not want to pursue with treatment,  GOC were established with with palliative care service, and patient's code was changed to DNR. Patient's son considered hospice facility, but ultimately decided to discharge home with PACE nurse support, with the understanding that if patient required more support, he would reach out to [facility] for hospice support.   Patient returns today to the ED. Per son: since last hospitalization patient was interactive . Would eat some solids, quarter ounces of juice, sometimes drink Ensure. Last Friday, decreased intake. No liquids or foods since Saturday. Patient has become less interactive, moaning at times, but not responding to name.  Last night, patient's son gave patient dose of morphine before bed.  This morning, son noticed the urine was very dark orange, mouth was crusty, but denied blood or dark/tarry stools. He has not had a BM  in about a week.  Patient has not been able to report pain; son is unable to know if patient has been in pain in the past week. PACE nurse usually takes vitals and BG, but not today. Previously, his vitals has been stable. Patient's BG had been 70-100 until Saturday.  He understands that this is probably because of the cancer.  Patient continues to see his regular aide who sees him 3hr/day. Patient had spoken with ED provider and considered reverting DNR code status. After further consideration, patient's son would like his dad to remain DNR, but wished for his dad to receive fluids and blood should he needed them. Patient's son would like to re-engage with palliative care to explore management from now on.   ED Course: patient was not interactive, dry MM, afebrile and normotensive. BG <10, Cr2.46, Hgb 5.0 (from 8.7 13d ago), FOBT positive, chest XR without acute findings, EKG with sinus tachycardia.  Bladder scan with 78m. Patient was given  D5 541m 2L NaCL boluses, and started on D5 infusion. Patient was also started on 2 units of RBCs transfusion and admitted to IMTS.  Meds:  Current Meds  Medication Sig   acetaminophen (TYLENOL) 500 MG tablet Take 2 tablets (1,000 mg total) by mouth every 6 (six) hours. (Patient taking differently: Take 1,000 mg by mouth every 6 (six) hours as needed for mild pain.)   hydroxypropyl methylcellulose / hypromellose (ISOPTO TEARS / GONIOVISC) 2.5 % ophthalmic solution  Place 1 drop into both eyes 3 (three) times daily as needed for dry eyes.   LORazepam (ATIVAN) 2 MG/ML concentrated solution Take 2.5 mLs by mouth every 2 (two) hours as needed for sleep.   Morphine Sulfate (MORPHINE CONCENTRATE) 10 mg / 0.5 ml concentrated solution Take 0.25 mLs by mouth every 2 (two) hours as needed for severe pain.   Multiple Vitamins-Minerals (CENTRUM ADULTS) TABS Take 1 tablet by mouth daily.    Past Medical History  Past Surgical History:  Procedure Laterality Date    INTRAMEDULLARY (IM) NAIL INTERTROCHANTERIC Right 04/13/2020   Procedure: INTRAMEDULLARY (IM) NAIL INTERTROCHANTRIC RIGHT FEMORAL NAIL;  Surgeon: Nicholes Stairs, MD;  Location: [place];  Service: Orthopedics;  Laterality: Right;    Social:  Patient currently lives with his son and son's girlfriend.  Patient has been using briefs as he is incontinent, bedbound, and responding to his name prior to this hospitalization. Usually is alert and able to track with minimal verbal activity. Was able to eat some and drink  1/2 Ensure prior to the last week.   Son states that patient has never smoked cigarettes, and has not used alcohol in over 20 years.  He is a PACE patient with Aide that comes daily 3hrs/day.  Family History: Not obtained  Allergies: Allergies as of 06/15/2022   (No Known Allergies)    Review of Systems: A complete ROS was negative except as per HPI.   OBJECTIVE:   Physical Exam: Blood pressure 124/78, pulse (!) 102, temperature 98.5 F (36.9 C), temperature source Rectal, resp. rate 18, SpO2 99 %.  Constitutional: ill-appearing, non conversational, frail man reclining sideways in bed, in no apparent distress HENT: normocephalic atraumatic, dry mucous membranes Cardiovascular: regular rate and rhythm, no m/r/g Pulmonary/Chest: normal work of breathing on 2L O2, lungs clear to auscultation anteriorly, bilaterally. No crackles  Abdominal: soft,  non-distended, no grimacing on palpation.  Neurological: Awake/alert but confused. Not responding to his name consistently. Nonverbal and does not follow commands MSK: contracted lower extremities, at baseline per son and prior notes Skin: warm and dry. Decreased skin turgor. No apparent pressure ulcers on skin. Psych: unable to evaluate  Labs: CBC    Component Value Date/Time   WBC 13.1 (H) 06/11/2022 2241   RBC 2.14 (L) 06/01/2022 2241   HGB 5.0 (LL) 05/28/2022 2241   HCT 17.2 (L) 05/29/2022 2241   PLT 403 (H) 06/17/2022  2241   MCV 80.4 06/05/2022 2241   MCH 23.4 (L) 06/06/2022 2241   MCHC 29.1 (L) 06/11/2022 2241   RDW 23.8 (H) 06/21/2022 2241   LYMPHSABS 0.8 06/17/2022 2241   MONOABS 0.5 06/02/2022 2241   EOSABS 0.0 06/17/2022 2241   BASOSABS 0.0 05/22/2022 2241     CMP     Component Value Date/Time   NA 145 06/21/2022 2241   K 3.8 06/15/2022 2241   CL 110 06/19/2022 2241   CO2 23 05/31/2022 2241   GLUCOSE 126 (H) 06/12/2022 2241   BUN 68 (H) 06/19/2022 2241   CREATININE 2.46 (H) 06/06/2022 2241   CALCIUM 8.9 05/26/2022 2241   PROT 5.6 (L) 05/28/2022 2241   ALBUMIN 1.5 (L) 06/06/2022 2241   AST 194 (H) 05/23/2022 2241   ALT 49 (H) 06/20/2022 2241   ALKPHOS 131 (H) 06/01/2022 2241   BILITOT 3.2 (H) 05/31/2022 2241   GFRNONAA 24 (L) 05/31/2022 2241   GFRAA >60 04/16/2020 0407   U/A, pending  Blood cultures, pending  Imaging: DG Chest Port 1  View  Result Date: 06/06/2022 CLINICAL DATA:  Questionable sepsis; evaluate for abnormality EXAM: PORTABLE CHEST 1 VIEW COMPARISON:  Radiographs 05/22/2022 FINDINGS: Stable cardiomediastinal silhouette. Aortic atherosclerotic calcification. No focal consolidation, pleural effusion, or pneumothorax. No acute osseous abnormality. Presumed nipple shadow projecting over the right lower lung. IMPRESSION: No acute finding.  Stable cardiomegaly. Electronically Signed   By: Placido Sou M.D.   On: 06/11/2022 23:03      EKG: personally reviewed my interpretation is sinus tachycardia  Telemetry with sinus tachycardia and PVCs . ASSESSMENT & PLAN:   Assessment & Plan by Problem: Principal Problem:   Acute metabolic encephalopathy  Mr. 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 Suspect this is the setting of poor oral intake leading to hypoglycemia,, dehydration, and AKI. Patient was found to have BG of <10 and Cr 2.46 from BL at 0.9. Suspect a component of chronic brain hypoxia in setting of profound anemia vs dementia vs malignancy. No reported sxs or signs of infection other than leukocytosis, downtrending since last hospitalization. Will f/u blood cultures and U/A. Patient BG is now in normal range. Patient is currently receiving fluid resuscitation and blood transfusions with Hgb goal of >7. Will continue to monitor. SLP consulted to assess patients ability to eat/drink once some of his metabolic derangements have normalized. -CTM vitals signs and assess for symptom management -U/A, pending -Blood cultures, pending  -cBG monitoring -Transfuse 2 units of pRBC -Maintenance IVF 52m/HR -SLP consult   Severe symptomatic microcytic  anemia Suspected metastatic GI malignancy Dementia During 10/1 admission, patient hgb was 4.4. During work up, lesions were found in the liver, lung, proximal greater curvature of the stomach, concerning for gastric malignancy per CT imaging. Patient's son decided to forgo further work up and patient was discharged with palliative care measures and continue as PACE patient. Patient required multiple blood transfusions, and his discharge Hgb was 8.7. Today patient presents with hgb 5, +FOBT, concerning for GI bleed likely in the setting of GI malignancy. Patient has also progressively decreased interest in PO intake, concerning for worsening of his dementia vs malignancy. Family has expressed desire in comfort measures and would like to proceed with transfusion but not procedures at this time. Will transfuse blood and replete electrolytes in setting of low PO intake, but son's aware of this as a temporizing measure for  his father prognosis. Patient's family wishes to speak with palliative care during this hospitalization for further management goals.  -Transfuse 2 units of pRBC -Maintenance IVF 75 ml/HR -Consult to palliative care -Consult to SLP -Morphine 2 mg for moderate - severe pain -Zofran 4 mg nausea PRN  AKI Likely pre-renal in setting of poor po intake for the past week. Bladder scan with 98 mL. Patient is s/p 2x 1L NaCl IVF bolus and now in continuous infusion. Will continue to monitor renal function.  -CTM renal function   Diet: NPO VTE: SCDs Code: DNR  Prior to Admission Living Arrangement: With son Anticipated Discharge Location: pending palliative care discussion Barriers to Discharge: pending GCarrolldiscussion with son  Dispo: Admit patient to Observation with expected length of stay  less than 2 midnights.  Signed:  Romana Juniper, MD Internal Medicine Resident PGY-1 2022-06-14, 2:14 AM

## 2022-06-22 NOTE — Evaluation (Signed)
Clinical/Bedside Swallow Evaluation Patient Details  Name: Patrick Rhodes MRN: [id number] Date of Birth: [birth year]  Today's Date: 06-16-2022 Time: SLP Start Time (ACUTE ONLY): 1132 SLP Stop Time (ACUTE ONLY): 4098 SLP Time Calculation (min) (ACUTE ONLY): 13 min  Past Medical History:  Past Medical History:  Diagnosis Date   Dementia ([place])    High cholesterol    Hypertension    Hypothyroidism    Stroke [facility])    Past Surgical History:  Past Surgical History:  Procedure Laterality Date   INTRAMEDULLARY (IM) NAIL INTERTROCHANTERIC Right 04/13/2020   Procedure: INTRAMEDULLARY (IM) NAIL INTERTROCHANTRIC RIGHT FEMORAL NAIL;  Surgeon: Nicholes Stairs, MD;  Location: [place];  Service: Orthopedics;  Laterality: Right;   HPI:  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 Found to have acute metabolic encephalopathy secondary to hypoglycemia and suspected metastatic GI malignancy. Pt was recently hospitalized (05/22/2022) for failure to thrive where he was found to be profoundly anemic. Previous clinical swallow evaluation 05/23/2022, no overt s/sx aspiration noted, thin liquids/Dys 1 diet ordered due to AMS.    Assessment / Plan / Recommendation  Clinical Impression  Pt seen for clinical swallow evaluation and presents this session with decreased ability to consume po's compared to previous evaluation (05/24/2022). He made no attempts to interact, moaning in pain when stimulated. Pt was repositioned in bed and oral care was provided. Pt only responded to pain when attempting to clear dried secretions throughout session. No PO trials were able to be administered given his positioning (significant lean to left and forward) and inability to attend. If his attention improves, staff could attempt ice  chip for moisture and comfort however his overall status appears guarded at this time. Noted Palliative and possible Hospice measures being discussed with son per progress notes. No ST treatment recommended at this time, however please reconsult if there are improvements in pt's status. SLP Visit Diagnosis: Dysphagia, unspecified (R13.10)    Aspiration Risk  Risk for inadequate nutrition/hydration;Severe aspiration risk    Diet Recommendation NPO;Other (Comment) (ice chips if pt able)   Medication Administration: Crushed with puree    Other  Recommendations Oral Care Recommendations: Oral care QID;Oral care prior to ice chip/H20    Recommendations for follow up therapy are one component of a multi-disciplinary discharge planning process, led by the attending physician.  Recommendations may be updated based on patient status, additional functional criteria and insurance authorization.  Follow up Recommendations No SLP follow up      Assistance Recommended at Discharge Frequent or constant Supervision/Assistance  Functional Status Assessment Patient has had a recent decline in their functional status and/or demonstrates limited ability to make significant improvements in function in a reasonable and predictable amount of time  Frequency and Duration            Prognosis Prognosis for Safe Diet Advancement: Guarded Barriers to Reach Goals: Cognitive deficits;Severity of deficits      Swallow Study   General Date of Onset: 05/23/2022 HPI: 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 Found to have acute metabolic encephalopathy secondary to hypoglycemia and suspected  metastatic GI malignancy. Pt was recently hospitalized (05/22/2022) for failure to thrive where he was found to be profoundly  anemic. Previous clinical swallow evaluation 05/23/2022, no overt s/sx aspiration noted, thin liquids/Dys 1 diet ordered due to AMS. Type of Study: Bedside Swallow Evaluation Previous Swallow Assessment: see HPI Diet Prior to this Study: NPO Temperature Spikes Noted: No Respiratory Status: Room air History of Recent Intubation: No Behavior/Cognition: Confused;Agitated Oral Cavity Assessment: Dry;Dried secretions Oral Care Completed by SLP: Yes Oral Cavity - Dentition: Edentulous Vision: Functional for self-feeding Self-Feeding Abilities: Total assist Patient Positioning: Other (comment);Postural control interferes with function (significant lean to left) Baseline Vocal Quality: Not observed Volitional Cough: Cognitively unable to elicit Volitional Swallow: Unable to elicit    Oral/Motor/Sensory Function Overall Oral Motor/Sensory Function: Other (comment) (Cognitively unable to assess)   Ice Chips Ice chips: Not tested   Thin Liquid Thin Liquid: Not tested    Nectar Thick Nectar Thick Liquid: Not tested   Honey Thick Honey Thick Liquid: Not tested   Puree Puree: Not tested   Solid     Solid: Not tested      [facility] Student SLP  June 24, 2022,1:13 PM

## 2022-06-22 NOTE — Progress Notes (Signed)
Respirations slightly labored, Dr. Jimmye Norman in to see pt., awaiting son to discuss plan of care, cont. Pulse ox discontinued per MD, Morphine 2 mg IV for labored resps given, will cont. To monitor  Patrick Rhodes

## 2022-06-22 NOTE — ED Notes (Signed)
Pt CBG 16, 1 amp D50% given per protocol

## 2022-06-22 NOTE — Consult Note (Signed)
Palliative Care Consult Note                                  Date: 06-13-22   Patient Name: Patrick Rhodes  DOB: [birth year]  MRN: [id number]  Age / Sex: 88 y.o., male  PCP: Pcp, No Referring Physician: Angelica Pou, MD  Reason for Consultation: Establishing goals of care  HPI/Patient Profile: 88 y.o. 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 intake.  Of note, patient was recently hospitalized 05/22/2022 through 05/26/2022 for failure to thrive was found to be profoundly anemic (4.4), requiring multiple blood transfusions.  He was ultimately discharged back home with his son to resume versus with the [facility]. He was admitted to IMTS with acute metabolic encephalopathy, hypoglycemia in the setting of poor oral intake, and severe symptomatic anemia. Palliative medicine has been consulted for goals of care.    Past Medical History:  Diagnosis Date   Dementia ([place])    High cholesterol    Hypertension    Hypothyroidism    Stroke [facility])     Subjective:   I have reviewed medical records including progress notes, labs and imaging.  Patient was seen by Palliative Medicine during his recent admission (10/1-10/5).  Code status was changed to DNR/DNI.  Son declined further work-up for suspected GI malignancy.  Hospice facility was discussed, but son ultimately decided to discharge home with PACE services.  I assessed patient at bedside in the ED.  He appears very frail and is contracted.  He is not verbally responsive and does not follow commands. He appears restless and uncomfortable.  He is tachypneic.   I spoke with his son Ayo by phone to discuss diagnosis, prognosis, [place], EOL wishes, disposition, and options. I re-introduced  Palliative Medicine as specialized medical care for people living with serious illness. It focuses on providing relief from the symptoms and stress of a serious illness.   We discussed patient's current medical condition and what it means in the larger context of his ongoing co-morbidities. Current clinical status was reviewed.  We discussed his failure to thrive, decreased oral intake, suspected GI malignancy, and underlying dementia.  I shared my concern that Mr. Gilreath is dying.  Natural disease trajectory at end-of-life was discussed.  The difference between full scope medical intervention and comfort care was discussed.  I reviewed the concept of a comfort path, emphasizing this involves de-escalating and stopping full scope medical interventions, allowing a natural course to occur. Discussed that the goal is comfort and dignity rather than cure/prolonging life.    Created space and opportunity for son to express his thoughts regarding patient's current medical situation. [place] verbalizes understanding that "the end is near" for his father.  He verbalizes not wanting to "prolong his suffering".  Elsie states he is leaning toward transition to comfort care but wants to speak with a few family members first.  Plan is to continue current interventions for now with follow-up discussion tomorrow or sooner if needed.  I confirmed code status as DNR/DNI.  I recommended no escalation of care in the event of acute decompensation, to which Jenny Reichmann agrees. We also discussed the use of pain medication as needed to alleviate pain and anxiety, to which Jenny Reichmann  was agreeable.  Bartlomiej states he is coming to the hospital when he gets off work this evening.  I provided Chidiebere with contact info for PMT and encouraged him to call with questions or concerns.    After speaking with son, I returned to the bedside to discuss plan of care with patient's RN. He is now hypoglycemic with CBG of 16. ED RN has given 1 amp D50  and CBG is  61 on re-check. Another amp of D50 was given. I also advised her to administer morphine.   I spoke with a physician on patient's care team at [facility]. He states that Mr. Mahone has been approaching end of life for several months. Son had previously expressed not wanting his father to die at home, so plan had been to transfer to [facility] when he was eligible.  I shared my concern that patient was likely not stable to transfer to [facility].  He states the PACE team would support allowing Mr. Majerus to pass comfortably in the hospital.   Plan of care was was discussed with IM resident Dr. Sanjuana Mae. Someone from his team will be available to speak with son when he arrives this evening.     Review of Systems  Unable to perform ROS   Objective:   Primary Diagnoses: Present on Admission:  Acute metabolic encephalopathy   Physical Exam Vitals reviewed.  Constitutional:      General: He is not in acute distress.    Appearance: He is ill-appearing.     Comments: Contracted, frail  Pulmonary:     Effort: Tachypnea present. No respiratory distress.  Psychiatric:        Speech: He is noncommunicative.        Cognition and Memory: Cognition is impaired.     Vital Signs:  BP 136/89   Pulse (!) 101   Temp 98.5 F (36.9 C)   Resp 16   SpO2 94%   Palliative Assessment/Data: PPS 10%     Assessment & Plan:   SUMMARY OF RECOMMENDATIONS   DNR/DNI as previously documented Continue current supportive interventions for now No escalation of care Son considering transition to full comfort care, but needs to speak to family first Changed IV fluid to D5NS until son can arrive this evening PMT will follow-up with son tomorrow  Symptom management: Morphine 1-2 mg IV every 3 hours as needed for pain or increased work of breathing Agree with Ativan 1 mg IV every 6 hours as needed for anxiety or agitation  Primary Decision Maker: Son Delcie Roch  Prognosis:  poor  Discharge  Planning:  To Be Determined, likely hospital death   Thank you for allowing the Palliative Medicine Team to assist in the care of this patient.   Greater than 50%  of this time was spent counseling and coordinating care related to the above assessment and plan.  Total time: 92 minutes   Lavena Bullion, NP Palliative Medicine   Please contact Palliative Medicine Team phone at [phone] for questions and concerns.  For individual provider, see AMION.

## 2022-06-22 NOTE — ED Notes (Signed)
Pt was appearing in distress, frowning and breathing heavy.  Palliative care NP at bedside and medication given for comfort. Pt was changed, he had a BM, please note pt has some skin breakdown generally on buttock and coxxyx with stage 2 pressure sore on left buttock.  Applioed new depend and repositioned for comfort.  Pt has had minimal dark urine output

## 2022-06-22 NOTE — Progress Notes (Addendum)
Patient was seen this a.m. bedside.  He looks much worse than previous admission a couple weeks ago, and was easily irritated with touch.  We performed a full skin exam on patient to help rule out any open pressure injuries that could be causing him pain - none were found.  Have spoken with palliative care who has been in contact with pace, and at this point it is unlikely that patient will survive transport to [facility] for hospice care.  Spoke with son on the phone, and explained situation to him as patient has become hypoglycemic twice since being admitted, and he wants patient to stay in hospital versus being transported.  We will continue supportive treatment until patient's son gets here and is able to make that decision to transition to comfort care, and go from there.   Dr. Marlene Lard Isatou Agredano Pager #: [phone]

## 2022-06-22 NOTE — ED Notes (Signed)
Call from [place] who is his Education officer, museum with [facility].  She tells me that pt is end of life and he was going to go to [facility].  She states that they would support transfer to [facility].

## 2022-06-22 NOTE — ED Notes (Signed)
Spoke with pt's son on the phone, verbal consent given for blood products.

## 2022-06-22 NOTE — Death Summary Note (Signed)
  Name: Patrick Rhodes MRN: [id number] DOB: October 05, 1929 88 y.o.  Date of Admission: 06/12/2022 10:24 PM Date of Discharge: 05/23/2022 Attending Physician: Dorian Pod, MD  Discharge Diagnosis: Principal Problem:   Acute metabolic encephalopathy Metastatic GI malignancy Severe hypoglycemia Advance dementia GI bleed Severe symptomatic anemia Acute kidney injury   Cause of death: Metastatic GI malignancy Time of death: November 25, 2213  Disposition and follow-up:   [name] was discharged from [facility] in expired condition.    Hospital Course: 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  Due to his advanced age, advanced dementia and comorbidities, his son appropriately decided to forego further work-up and to focus on his comfort. Patient was discharged home to follow-up with [facility] for assistance with home hospice. Due to an acute decline with change in mental status as well as inability to maintain nutrition and hydration, his son brought him back to the ED. Patient was subsequently admitted for acute metabolic encephalopathy, severe hypoglycemia in the setting of poor oral intake and severe symptomatic anemia likely from his suspected GI malignancy.  Stabilizing measures such as blood transfusion, IV fluids and D50 were initiated, however patient was found to be actively dying. Based on this assessment, the medical team thought it was most appropriate to focus on patient's comfort with plan to discuss with his son, palliative care and his [facility] team about transitioning patient to residential hospice. During a meeting between palliative care and patient's son, he was informed that patient is likely not stable to transfer to [facility].  His son spoke with other family members and the decision was made to transition patient to comfort  care in the hospital with expected hospital death. Comfort care orders were placed and patient expired peacefully Wednesday night at Nov 25, 2213 with son at his bedside. Patient's grandson as well as 3 other family members were able to visit patient before he was transported to the morgue.  Signed: Lacinda Axon, MD 06/16/2022, 5:29 AM

## 2022-06-22 NOTE — ED Notes (Signed)
Pt moved to hospital bed for comfort.  Pt appears uncomfoteable, [place] increased and CBG checked

## 2022-06-22 NOTE — Progress Notes (Signed)
Incident: Pt DNR Assessment: On assessment patient had no respirations, pulse, or heart sounds.  Confirmation: Earleen Reaper, RN) RN confirmed these findings as well.  Time of Death: 24-Nov-2213 [employer]: Not a candidate for donation [phone]) Family: At bedside [place]: Not notified Patient Placement: Notified   Quentin Angst, RN

## 2022-06-22 NOTE — ED Notes (Signed)
Pt's HR 180s, new EKG done, given to Dr. Christy Gentles.

## 2022-06-22 NOTE — Progress Notes (Signed)
PHARMACY - PHYSICIAN COMMUNICATION CRITICAL VALUE ALERT - BLOOD CULTURE IDENTIFICATION (BCID)  Patrick Rhodes is an 88 y.o. male who presented to [facility] on 06/21/2022 with a chief complaint of altered mental status  Assessment:  One blood culture bottle is growing gram-positive rods.  Suspect contamination  Name of physician (or Provider) Contacted: Dr. Eulas Post  Current antibiotics: none  Changes to prescribed antibiotics recommended: continue to withhold antibiotics   No results found for this or any previous visit.  Patrick Rhodes, Patrick Rhodes 06-27-2022  10:32 AM

## 2022-06-22 NOTE — Plan of Care (Signed)

## 2022-06-22 DEATH — deceased
